# Patient Record
Sex: Male | Born: 1935
Health system: Southern US, Community
[De-identification: ages and names within clinical notes are randomized; demographics above are authoritative.]

## PROBLEM LIST (undated history)

## (undated) DIAGNOSIS — C61 Malignant neoplasm of prostate: Secondary | ICD-10-CM

## (undated) DIAGNOSIS — R911 Solitary pulmonary nodule: Secondary | ICD-10-CM

## (undated) DIAGNOSIS — E781 Pure hyperglyceridemia: Secondary | ICD-10-CM

## (undated) DIAGNOSIS — Z5189 Encounter for other specified aftercare: Secondary | ICD-10-CM

## (undated) DIAGNOSIS — R7303 Prediabetes: Secondary | ICD-10-CM

## (undated) DIAGNOSIS — C160 Malignant neoplasm of cardia: Secondary | ICD-10-CM

## (undated) DIAGNOSIS — E039 Hypothyroidism, unspecified: Secondary | ICD-10-CM

## (undated) DIAGNOSIS — N184 Chronic kidney disease, stage 4 (severe): Secondary | ICD-10-CM

## (undated) DIAGNOSIS — I82409 Acute embolism and thrombosis of unspecified deep veins of unspecified lower extremity: Secondary | ICD-10-CM

## (undated) DIAGNOSIS — I10 Essential (primary) hypertension: Secondary | ICD-10-CM

## (undated) DIAGNOSIS — IMO0001 Reserved for inherently not codable concepts without codable children: Secondary | ICD-10-CM

## (undated) HISTORY — DX: Solitary pulmonary nodule: R91.1

## (undated) HISTORY — DX: Reserved for inherently not codable concepts without codable children: IMO0001

## (undated) HISTORY — DX: Pure hyperglyceridemia: E78.1

## (undated) HISTORY — PX: OTHER SURGICAL HISTORY: SHX169

## (undated) HISTORY — DX: Acute embolism and thrombosis of unspecified deep veins of unspecified lower extremity: I82.409

## (undated) HISTORY — DX: Malignant neoplasm of prostate: C61

## (undated) HISTORY — PX: HERNIA REPAIR: SHX51

## (undated) HISTORY — DX: Prediabetes: R73.03

## (undated) HISTORY — DX: Encounter for other specified aftercare: Z51.89

## (undated) HISTORY — PX: PROSTATECTOMY: SHX69

## (undated) HISTORY — DX: Hypothyroidism, unspecified: E03.9

## (undated) HISTORY — DX: Essential (primary) hypertension: I10

---

## 1978-12-15 HISTORY — PX: OTHER SURGICAL HISTORY: SHX169

## 1996-12-15 HISTORY — PX: PROSTATECTOMY: SHX69

## 2008-06-29 ENCOUNTER — Ambulatory Visit (HOSPITAL_BASED_OUTPATIENT_CLINIC_OR_DEPARTMENT_OTHER): Admission: RE | Admit: 2008-06-29 | Discharge: 2008-06-29 | Payer: Self-pay | Admitting: General Surgery

## 2008-06-29 HISTORY — PX: OTHER SURGICAL HISTORY: SHX169

## 2008-12-15 HISTORY — PX: HERNIA REPAIR: SHX51

## 2011-04-29 NOTE — H&P (Signed)
NAME:  Daniel [name], Daniel [name]              ACCOUNT NO.:  [id number]   MEDICAL RECORD NO.:  [id number]          PATIENT TYPE:  AMB   LOCATION:  [facility]                         FACILITY:  [facility]   PHYSICIAN:  Orson Ape. Weatherly, M.D.DATE OF BIRTH:  1927-06-02   DATE OF ADMISSION:  06/29/2008  DATE OF DISCHARGE:                              HISTORY & PHYSICAL   CHIEF COMPLAINT:  Left inguinal hernia.   HISTORY:  Mr. Daniel [name] is a 77 year old Caucasian male, a retired  Gaffer, who was referred to me by Dr. Orpah Melter for a  mildly symptomatic left inguinal hernia.  The patient has had an open  prostatectomy for prostate cancer in the 1990's.  His PSA's have been  negative.  Over the last several years, he has noticed a little bulge  that has gradually increased in the left groin area.  More recently, he  is actually getting a little bulge that advances to the top of the  scrotum.   MEDICATIONS:  1. Atenolol 50 mg daily for blood pressure.  2. He takes a baby aspirin, which is not taking.  3. Synthroid 125 mcg daily for thyroid replacement.   ALLERGIES:  SEASONAL ALLERGIES ONLY.   PAST MEDICAL/SURGICAL HISTORY:  1. The prostate cancer, done through an open midline.  2. Nasal surgery.  3. Right ankle fracture.  4. Torn meniscus in the right knee.   SOCIAL HISTORY:  The patient is widowed.  His wife died in 2023-04-24.  He  lives alone.  He is quite active, gardening, etc.  His son will be with  him this evening, since he lives alone.   PHYSICAL EXAMINATION:  GENERAL:  He is an active male, who appears his  stated age, possibly a little younger.  He is in no acute distress.  VITAL SIGNS:  Today, and he did take his blood pressure this morning.  He weighs 180 pounds, height 5 feet 10 inches.  HEENT:  Unremarkable.  LUNGS:  Clear.  HEART:  A normal sinus rhythm.  ABDOMEN:  Flat, soft.  A well-healed lower midline incision.  With  straining there is a definite direct bulge.   Also a little extension  down into the top of the left scrotum.  I do not appreciate a hernia on  the right.  RECTAL:  In the office was unremarkable.  CNS:  Physiologic.   ADMISSION IMPRESSION:  1. Symptomatic left inguinal hernia, probably kind of a direct and an      indirect combination.  2. History of prostate cancer with normal prostatic specific antigens      now.  3. Mild hypertension.           ______________________________  Orson Ape. Rise Patience, M.D.     WJW/MEDQ  D:  06/29/2008  T:  06/29/2008  Job:  [id number]

## 2011-04-29 NOTE — Op Note (Signed)
NAME:  [name], Daniel              ACCOUNT NO.:  [id number]   MEDICAL RECORD NO.:  [id number]          PATIENT TYPE:  AMB   LOCATION:  [facility]                         FACILITY:  [facility]   PHYSICIAN:  Orson Ape. Weatherly, M.D.DATE OF BIRTH:  July 30, 1927   DATE OF PROCEDURE:  06/29/2008  DATE OF DISCHARGE:                               OPERATIVE REPORT   PREOPERATIVE DIAGNOSIS:  Left inguinal hernia.   OPERATION:  Left inguinal herniorrhaphy, a Lichtenstein type repair,  mesh sail.   ANESTHESIA:  Local with sedation.   SURGEON:  Dr. Orson Ape. Weatherly.   ASSISTANT:  Nurse.   INDICATIONS FOR PROCEDURE:  Daniel [name] is a 77 year old Caucasian  male who was referred to me by Dr. Orpah Melter, for a symptomatic  left inguinal hernia.  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  He lives alone and states that he is not really having any  problems with his prostate and his PSA's have been negative.  He is on  Synthroid 125 mcg daily.  He has had basically no other abdominal  surgeries.  He does take a mild diuretic for blood pressure.  His wife  died in 05-10-2023.   PHYSICAL EXAMINATION:  ABDOMEN:  He has an obvious bulge in the left  groin.  When he stands and strains vigorously, he will get the bulge  extending on down into the scrotum.  I expect he has a direct and  possibly an indirect kind of combination.   DESCRIPTION OF PROCEDURE:  Preoperatively he was given a gram of Ancef.  His electrolytes and all were normal.  He was taken back to the  operative suite.  The patient had already shaved himself part of the  way, but I used clippers to remove the remaining areas.  Then prepped  him with Betadine solution.  We started him on some IV sedation, but he  never required anything as far as assistance on his ventilation.  The  inguinal incision area after the Betadine prep was infiltrated  with a  mixture of 0.5% plain Xylocaine and 0.5% Marcaine with adrenalin.  Then  the ilioinguinal nerve area was blocked with about 10 mL of the same  solution.  The skin incision was picked up, a small incision made.  The  underlying three veins were identified, clamped, divided and ligated  with fine Vicryl.  Then the external oblique aponeurosis.  I infiltrated  it with a little additional Xylocaine mixture.  This was opened.  The  ilioinguinal nerve was protected, retracted, and the cord structures  elevated with the Penrose drain.  You could see that this is a direct  and indirect combination and I opened the areolar tissue with the cord  structures, dissected out the hernia sac.  It was about two inches in  length.  A high sac ligation was performed with #2-0 Vicryl under direct  vision.  This retracted back in the pre-peritoneal space and then the  floor was repaired with a running #2-0 Prolene,  starting at the  symphysis pubis, suturing the shelving edge of the inguinal ligament to  the conjoined tendon area, recreating the internal ring.  Then we went  back and tied the two ends together.  We did infiltrate the floor with a  little of the Marcaine mixture and then a piece of Prolene mesh was  placed, shaped like a sail and slit laterally and placed under the  ilioinguinal nerve.  This was sutured to the shelving edge of the  inguinal ligament with a running #2-0 Prolene.  The two tails sutured  around the internal ring and then the superior flap was sutured down  with interrupted sutures of #2-0 Prolene and some #2-0 Vicryl.  The mesh  was lying flat and not under excessive tension, and I think the nerve  was not injured.  Then the external oblique was closed with a running #3-  0 Vicryl.  Some #3-0 Vicryl interrupted in the Scarpa's fascia and then  a #4-0 Vicryl subcuticular and Benzoin and Steri-Strips on the skin.   The patient tolerated the procedure nicely.  His testicle  was in its  normal position.  He will be released after a short stay in the recovery  room.  His son will be with him this evening, since he lives alone, but  he should be okay for independent status tomorrow.  He understands he is  not to drive, as long as he is taking Vicodin.  I will see him in the  office in approximately two weeks.           ______________________________  Orson Ape. Rise Patience, M.D.     WJW/MEDQ  D:  06/29/2008  T:  06/29/2008  Job:  [id number]   cc:   Orpah Melter, M.D.  Fax: [phone]

## 2011-08-28 ENCOUNTER — Encounter (INDEPENDENT_AMBULATORY_CARE_PROVIDER_SITE_OTHER): Payer: Self-pay | Admitting: General Surgery

## 2011-08-29 ENCOUNTER — Ambulatory Visit (INDEPENDENT_AMBULATORY_CARE_PROVIDER_SITE_OTHER): Payer: Medicare Other | Admitting: General Surgery

## 2011-08-29 ENCOUNTER — Encounter (INDEPENDENT_AMBULATORY_CARE_PROVIDER_SITE_OTHER): Payer: Self-pay | Admitting: General Surgery

## 2011-08-29 VITALS — BP 104/76 | HR 60 | Temp 98.2°F | Ht 67.0 in | Wt 162.4 lb

## 2011-08-29 DIAGNOSIS — K4090 Unilateral inguinal hernia, without obstruction or gangrene, not specified as recurrent: Secondary | ICD-10-CM

## 2011-08-29 NOTE — Patient Instructions (Signed)
Check and make sure his son can be reviewed at the time of surgery as you were not to drive that day but hopefully can drive in one or 2 days

## 2011-08-29 NOTE — Progress Notes (Signed)
Subjective:     Patient ID: Daniel [name], male   DOB: [birth year], 77 y.o.   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 he did well and on reason examination he's got a right inguinal hernia that is now minimally symptomatic his daughter-in-law works as a Systems developer [name] at [employer] and she's got him a belt that he is presently wearing but he like to have his hernia repaired. He does not have any symptoms of bowel obstruction difficulty voiding and he's had a previous prostatectomy through a midline incision approximately 20 years ago and has had no follow problems with his prostate cancer. The patient is retired he has a girlfriend they dance and of activities but he lives alone. He used to work as Clinical biochemist for the [employer] prior to his retirement but he's been retired for quite a number of years.   Review of Systems Past Medical History  Diagnosis Date  . Hypothyroid   . Hypertriglyceridemia   . Prostate cancer   . Gout   . Hypertension   . Tinnitus   . Pulmonary nodule   . Prediabetes   . Blood transfusion   . Hearing loss    Current Outpatient Prescriptions  Medication Sig Dispense Refill  . ALPRAZolam (XANAX) 0.5 MG tablet Take 0.5 mg by mouth 3 (three) times daily as needed.        Marland Kitchen aspirin 81 MG tablet Take 81 mg by mouth daily.        Marland Kitchen atenolol (TENORMIN) 50 MG tablet Take 50 mg by mouth daily.        Marland Kitchen levothyroxine (SYNTHROID, LEVOTHROID) 150 MCG tablet Take 150 mcg by mouth daily.         No Known Allergies Past Surgical History  Procedure Date  . Prostatectomy     prostate cancer  . Left inguinal hernia repair 06/29/2008    with mesh sail  . Right ankle     fracture  . Right knee     torn meniscus  . Hernia repair   Limited review of systems he denies any significant allergies he is not restricted as far as activity and denies any angina  or pulmonary symptoms. He's not had an EKG a chest x-ray since his previous hernia repair 3 years ago.      Objective:   Physical ExamBP 104/76  Pulse 60  Temp 98.2 F (36.8 C)  Ht 5\' 7"  (1.702 m)  Wt 162 lb 6 oz (73.653 kg)  BMI 25.43 kg/m2 Patient is a well-developed small Caucasian male in no acute distress. He wears a hair piece but eyes ears and throat unremarkable. There is no supraclavicular or cervical lymphadenopathy the lungs are clear cardiac normal sinus rhythm. S1 and abdomen and is presently wearing a hernia belt. The right angle hernia area if he stands precludes probably a direct inguinal hernia with no evidence of any weakness or problems on the left. I did not do a rectal examination alert but is having no problems with voiding. No pedal edema good peripheral pulses    Assessment:    Right inguinal hernia minimally symptomatic and he desires surgical correction no evidence of weakness on the left side from a previous left ankle hernia repair and a well-healed healed midline lower dome incision from an open prostatectomy 20+ years ago     Plan:    Plan  surgical repair at [facility] in the near future. His son and will be with him postoperative first night

## 2011-09-12 LAB — BASIC METABOLIC PANEL
BUN: 14
Calcium: 9.2
GFR calc non Af Amer: >60
Glucose, Bld: 114 — ABNORMAL HIGH
Potassium: 4.1
Sodium: 142

## 2011-09-12 LAB — DIFFERENTIAL
Basophils Absolute: 0.1
Eosinophils Relative: 5
Lymphocytes Relative: 22
Lymphs Abs: 2
Neutro Abs: 5.9

## 2011-09-12 LAB — CBC
HCT: 48.2
Platelets: 248
RDW: 13.2
WBC: 9

## 2011-09-26 ENCOUNTER — Ambulatory Visit (HOSPITAL_COMMUNITY)
Admission: RE | Admit: 2011-09-26 | Discharge: 2011-09-26 | Disposition: A | Discharge status: Home / Self Care | Payer: Medicare Other | Source: Ambulatory Visit | Attending: General Surgery | Admitting: General Surgery

## 2011-09-26 ENCOUNTER — Other Ambulatory Visit (INDEPENDENT_AMBULATORY_CARE_PROVIDER_SITE_OTHER): Payer: Self-pay | Admitting: General Surgery

## 2011-09-26 ENCOUNTER — Ambulatory Visit (HOSPITAL_BASED_OUTPATIENT_CLINIC_OR_DEPARTMENT_OTHER)
Admission: RE | Admit: 2011-09-26 | Discharge: 2011-09-26 | Disposition: A | Discharge status: Home / Self Care | Payer: Medicare Other | Source: Ambulatory Visit | Attending: General Surgery | Admitting: General Surgery

## 2011-09-26 DIAGNOSIS — Z01818 Encounter for other preprocedural examination: Secondary | ICD-10-CM | POA: Insufficient documentation

## 2011-09-26 DIAGNOSIS — K4091 Unilateral inguinal hernia, without obstruction or gangrene, recurrent: Secondary | ICD-10-CM | POA: Insufficient documentation

## 2011-09-26 DIAGNOSIS — E039 Hypothyroidism, unspecified: Secondary | ICD-10-CM | POA: Insufficient documentation

## 2011-09-26 DIAGNOSIS — D1779 Benign lipomatous neoplasm of other sites: Secondary | ICD-10-CM | POA: Insufficient documentation

## 2011-09-26 DIAGNOSIS — K4090 Unilateral inguinal hernia, without obstruction or gangrene, not specified as recurrent: Secondary | ICD-10-CM | POA: Insufficient documentation

## 2011-09-26 DIAGNOSIS — Z01810 Encounter for preprocedural cardiovascular examination: Secondary | ICD-10-CM | POA: Insufficient documentation

## 2011-09-26 DIAGNOSIS — I10 Essential (primary) hypertension: Secondary | ICD-10-CM | POA: Insufficient documentation

## 2011-09-26 DIAGNOSIS — C61 Malignant neoplasm of prostate: Secondary | ICD-10-CM | POA: Insufficient documentation

## 2011-09-26 DIAGNOSIS — Z7982 Long term (current) use of aspirin: Secondary | ICD-10-CM | POA: Insufficient documentation

## 2011-09-26 DIAGNOSIS — Z79899 Other long term (current) drug therapy: Secondary | ICD-10-CM | POA: Insufficient documentation

## 2011-09-26 DIAGNOSIS — Z01811 Encounter for preprocedural respiratory examination: Secondary | ICD-10-CM | POA: Insufficient documentation

## 2011-09-26 DIAGNOSIS — Z01812 Encounter for preprocedural laboratory examination: Secondary | ICD-10-CM | POA: Insufficient documentation

## 2011-09-26 LAB — BASIC METABOLIC PANEL
Chloride: 105 mEq/L (ref 96–112)
GFR calc Af Amer: 86 mL/min — ABNORMAL LOW (ref >90)
GFR calc non Af Amer: 74 mL/min — ABNORMAL LOW (ref >90)
Glucose, Bld: 99 mg/dL (ref 70–99)
Potassium: 4.1 mEq/L (ref 3.5–5.1)
Sodium: 139 mEq/L (ref 135–145)

## 2011-09-26 LAB — CBC
Hemoglobin: 14.6 g/dL (ref 13.0–17.0)
MCHC: 35.2 g/dL (ref 30.0–36.0)
WBC: 8.1 10*3/uL (ref 4.0–10.5)

## 2011-09-26 LAB — POCT I-STAT 4, (NA,K, GLUC, HGB,HCT)
Glucose, Bld: 93 mg/dL (ref 70–99)
Potassium: 3.8 mEq/L (ref 3.5–5.1)

## 2011-09-26 LAB — DIFFERENTIAL
Basophils Absolute: 0.1 10*3/uL (ref 0.0–0.1)
Basophils Relative: 1 % (ref 0–1)
Monocytes Absolute: 0.7 10*3/uL (ref 0.1–1.0)
Neutro Abs: 4.6 10*3/uL (ref 1.7–7.7)
Neutrophils Relative %: 56 % (ref 43–77)

## 2011-10-01 NOTE — Op Note (Signed)
NAME:  Daniel [name], Daniel [name]              ACCOUNT NO.:  [id number]  MEDICAL RECORD NO.:  [id number]  LOCATION:  XRAY                         FACILITY:  [facility]  PHYSICIAN:  Orson Ape. Kaleth Koy, M.D.DATE OF BIRTH:  1927/07/18  DATE OF PROCEDURE:  09/26/2011 DATE OF DISCHARGE:                              OPERATIVE REPORT   PREOPERATIVE DIAGNOSIS:  Right inguinal hernia, probably direct.  POSTOPERATIVE DIAGNOSIS:  Right inguinal hernia.  There was a direct and a small indirect component.  OPERATION:  Right inguinal herniorrhaphy with mesh.  ANESTHESIA:  General anesthesia with local supplementation.  HISTORY:  Daniel [name] is an 76 year old male who approximately 3 years ago repaired a left inguinal hernia.  He has had a previous open prostatectomy years ago and has had no further problems with his prostate cancer.  Recently, he has noticed a bulge on the right groin area, kind of symmetrical to his previous left inguinal hernia.  I saw him in the office and he desired to having this repaired on a Friday if possible since his children are off work and can be with him that day as he lives alone.  He is on atenolol for blood pressure management and otherwise is in reasonably good health with his age of 67 years.  His electrolytes etc preoperatively were normal.  Potassium 3.5, hematocrit 34.  The patient had induction of general anesthesia with a LMA tube, then the right groin area was clipped and then prepped with Betadine solution.  Time-out was completed.  The area had been marked previously and we then draped him in a sterile manner.  The ilioinguinal block was first placed with a blunted 22-gauge needle at the anterior iliac crest, about 10 mL of 0.5% Marcaine with adrenaline.  Next, the inguinal incision was made, sharp dissection through the skin, subcutaneous, and Scarpa fascia and then the external oblique aponeurosis was identified. Two superficial veins had been clamped,  divided, and ligated with 1Vicryl and then the external oblique was opened through the external ring.  I could see one of the what looks like a very small ilioinguinal nerve and I tried to protract that, elevating it with the cord structures.  Next, he has sort of a lipoma coming down with the cord, it was seated, direct hernia component and I separated this lipoma from the cord structures.  He had about a 1 inch and a half indirect hernia sac that was removed.  High sac ligation was done under direct vision with 2- 0 Surgilon and a second suture was placed just past the first and the hernia sac removed.  Next, invaginated the floor and enclosed the shelving edge of the inguinal ligament to the conjoined tendon area with a running 0 Prolene, reinforcing or closing the floor and then running it back tying the two ends together.  Next, a piece of Prolene mesh shaped like a sail was sutured inferiorly and sutured to the inguinal ligament.  The two tails were around the internal new created inguinal ring and the two tails sutured together laterally and the mesh was lying flat.  I then used interrupted 2-0 Prolene sutures to suture the mesh to the medial aspect and on  the internal oblique fascia laterally, and the mesh was lying without excessive tension.  I had the anesthetist kind of lighten him at this time and when he squeezes up, the mesh looked like it would be better reinforced if we had it little tighter, so I took some of the sutures out laterally and pulled it up about 1 cm and then re-sutured it with the 2-0 Prolene.  Next, the external oblique aponeurosis was closed with a running 2-0 Vicryl, and the new external ring, I left it fairly wide since I think that it will not cause him to have any swelling in his testicle.  I made sure that the little mesh where it goes around the new internal ring is not excessively tight when the patient is relaxed.  The Scarpa fascia was then closed  with interrupted 3-0 Vicryl, 4-0 Vicryl subcuticular suture, benzoin and Steri-Strips on the skin.  The testicle was in its normal position.  I had put some additional Marcaine in the floor where I repaired it, in all total of about 25 mL of the Marcaine was used.  The patient hopefully will have no problems voiding, since he has had a previous prostatectomy and his son will be with him this evening.  He will be on a light diet today.  Hopefully, he will not have problems voiding and then follow up in the office in approximately 2 weeks.  The patient had received 1 gm of Ancef preoperatively.     Orson Ape. Rise Patience, M.D.     WJW/MEDQ  D:  09/26/2011  T:  09/26/2011  Job:  [id number]  Electronically Signed by Jeanella Anton M.D. on 10/01/2011 08:54:24 AM

## 2011-10-02 ENCOUNTER — Encounter (INDEPENDENT_AMBULATORY_CARE_PROVIDER_SITE_OTHER): Payer: Self-pay | Admitting: General Surgery

## 2011-10-03 ENCOUNTER — Encounter (INDEPENDENT_AMBULATORY_CARE_PROVIDER_SITE_OTHER): Payer: Self-pay | Admitting: General Surgery

## 2011-10-03 ENCOUNTER — Ambulatory Visit (INDEPENDENT_AMBULATORY_CARE_PROVIDER_SITE_OTHER): Payer: Medicare Other | Admitting: General Surgery

## 2011-10-03 DIAGNOSIS — K4090 Unilateral inguinal hernia, without obstruction or gangrene, not specified as recurrent: Secondary | ICD-10-CM

## 2011-10-03 NOTE — Patient Instructions (Signed)
Limits her activities no lifting of approximately 35 pounds for the next month. I think an adult aspirin probably twice a day for the next couple of weeks would decrease your incisional pain. See me for final followup visit in one month

## 2011-10-03 NOTE — Progress Notes (Signed)
Subjective:     Patient ID: Daniel [name], male   DOB: 03/31/1927, 77 y.o.   MRN: [id number] Patient returns pressure one week from a riding hernia repair and he says he is having very little pain at this time. He had a little spell initially in the scrotum and a little blurriness at the base of his penis but that's all tightened away and he's back to normal activityHPI   Review of Systemsno change      Objective:   Physical Exam BP 118/60  Pulse 60  Temp 97.6 F (36.4 C)  Resp 18  Ht 5\' 7"  (1.702 m)  Wt 160 lb 8 oz (72.802 kg)  BMI 25.14 kg/m2   The patient's incision is healing nicely there is no swelling in the right testicle slight bruise in the most dependent portion of the scrotum and no evidence of any acute inflammation or problems in his right inguinal incision areas his pain is decreasing rapidly and I would recommend he take an adult aspirin about twice a day for the next week or 2 minimize his pain Assessment:    Satisfactory postoperative course right angle hernia repair planned final followup in one month     Plan:

## 2011-11-04 ENCOUNTER — Encounter (INDEPENDENT_AMBULATORY_CARE_PROVIDER_SITE_OTHER): Payer: Medicare Other | Admitting: General Surgery

## 2018-08-12 ENCOUNTER — Other Ambulatory Visit: Payer: Self-pay | Admitting: Nephrology

## 2018-08-12 DIAGNOSIS — N183 Chronic kidney disease, stage 3 unspecified: Secondary | ICD-10-CM

## 2018-08-12 DIAGNOSIS — I129 Hypertensive chronic kidney disease with stage 1 through stage 4 chronic kidney disease, or unspecified chronic kidney disease: Secondary | ICD-10-CM

## 2018-08-12 DIAGNOSIS — R809 Proteinuria, unspecified: Secondary | ICD-10-CM

## 2018-08-24 ENCOUNTER — Ambulatory Visit
Admission: RE | Admit: 2018-08-24 | Discharge: 2018-08-24 | Disposition: A | Discharge status: Home / Self Care | Payer: Medicare Other | Source: Ambulatory Visit | Attending: Nephrology | Admitting: Nephrology

## 2018-08-24 DIAGNOSIS — R809 Proteinuria, unspecified: Secondary | ICD-10-CM

## 2018-08-24 DIAGNOSIS — N183 Chronic kidney disease, stage 3 unspecified: Secondary | ICD-10-CM

## 2018-08-24 DIAGNOSIS — I129 Hypertensive chronic kidney disease with stage 1 through stage 4 chronic kidney disease, or unspecified chronic kidney disease: Secondary | ICD-10-CM

## 2018-08-25 ENCOUNTER — Telehealth: Payer: Self-pay | Admitting: Hematology

## 2018-08-25 NOTE — Telephone Encounter (Signed)
New referral received from Dr. Carolin Sicks from [facility] for elevated serum free light chains. Pt has been scheduled to see Dr. Maylon Peppers on 9/23 at 1pm. Pt's daughter is aware of the appt date and time.

## 2018-08-28 ENCOUNTER — Other Ambulatory Visit: Payer: Self-pay | Admitting: Hematology and Oncology

## 2018-08-28 ENCOUNTER — Other Ambulatory Visit: Payer: Self-pay | Admitting: Hematology

## 2018-09-02 ENCOUNTER — Telehealth: Payer: Self-pay | Admitting: *Deleted

## 2018-09-02 ENCOUNTER — Other Ambulatory Visit: Payer: Self-pay | Admitting: Hematology

## 2018-09-02 DIAGNOSIS — J449 Chronic obstructive pulmonary disease, unspecified: Secondary | ICD-10-CM | POA: Insufficient documentation

## 2018-09-02 DIAGNOSIS — I10 Essential (primary) hypertension: Secondary | ICD-10-CM | POA: Insufficient documentation

## 2018-09-02 DIAGNOSIS — D8989 Other specified disorders involving the immune mechanism, not elsewhere classified: Secondary | ICD-10-CM

## 2018-09-02 DIAGNOSIS — E039 Hypothyroidism, unspecified: Secondary | ICD-10-CM | POA: Insufficient documentation

## 2018-09-02 DIAGNOSIS — C9000 Multiple myeloma not having achieved remission: Secondary | ICD-10-CM | POA: Insufficient documentation

## 2018-09-02 NOTE — Telephone Encounter (Signed)
Attempted to call patient to advise we needed to add lab appointment before his new patient appointment and also Dr Maylon Peppers wanted him to have a DG Bone Survey done as well before his appointment if at all possible.  The Xray of his bones has been scheduled for tomorrow.  Phone number on file was for daughter.  Left detailed message and return call back number in case new appt times and dates do not work.

## 2018-09-02 NOTE — Assessment & Plan Note (Addendum)
The patient's external labs and documents were reviewed extensively in the collaborated with the patient and his family. 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  While recent labile BP due to medication adjustment may be a contributor to the kidney disease, light chain myeloma is known to cause kidney dysfunction.  Patient underwent bone marrow biopsy in end of 08/2018, which showed 12% plasma cells on morphology.  The case was discussed w/ pathology to clarify the flow cytometry report, which did not test for monoclonal plasma cell population; however, the IHC showed lambda light chain restriction, consistent w/ plasma cell neoplasm. Metastatic skeletal survey did not show any bony lesion. Given that patient is not a transplant candidate and has other myeloma-defined features, PET scan would not change treatment; therefore, we will not pursue additional imaging studies.  Based on the patient's age, oral regimen, such as IRd, may be more preferable over IV/SubQ regimen.

## 2018-09-03 ENCOUNTER — Ambulatory Visit (HOSPITAL_COMMUNITY): Admission: RE | Admit: 2018-09-03 | Payer: Medicare Other | Source: Ambulatory Visit

## 2018-09-06 ENCOUNTER — Inpatient Hospital Stay: Payer: Medicare Other | Attending: Hematology | Admitting: Hematology

## 2018-09-06 ENCOUNTER — Other Ambulatory Visit: Payer: Self-pay | Admitting: *Deleted

## 2018-09-06 ENCOUNTER — Telehealth: Payer: Self-pay | Admitting: Hematology

## 2018-09-06 ENCOUNTER — Inpatient Hospital Stay: Payer: Medicare Other

## 2018-09-06 ENCOUNTER — Encounter: Payer: Self-pay | Admitting: Hematology

## 2018-09-06 VITALS — BP 160/72 | HR 52 | Temp 97.6°F | Resp 18 | Ht 67.0 in | Wt 153.8 lb

## 2018-09-06 DIAGNOSIS — J449 Chronic obstructive pulmonary disease, unspecified: Secondary | ICD-10-CM

## 2018-09-06 DIAGNOSIS — D472 Monoclonal gammopathy: Secondary | ICD-10-CM

## 2018-09-06 DIAGNOSIS — N183 Chronic kidney disease, stage 3 unspecified: Secondary | ICD-10-CM | POA: Insufficient documentation

## 2018-09-06 DIAGNOSIS — C9000 Multiple myeloma not having achieved remission: Secondary | ICD-10-CM | POA: Insufficient documentation

## 2018-09-06 DIAGNOSIS — D8989 Other specified disorders involving the immune mechanism, not elsewhere classified: Secondary | ICD-10-CM

## 2018-09-06 DIAGNOSIS — D539 Nutritional anemia, unspecified: Secondary | ICD-10-CM | POA: Insufficient documentation

## 2018-09-06 DIAGNOSIS — Z87891 Personal history of nicotine dependence: Secondary | ICD-10-CM | POA: Insufficient documentation

## 2018-09-06 DIAGNOSIS — D519 Vitamin B12 deficiency anemia, unspecified: Secondary | ICD-10-CM | POA: Insufficient documentation

## 2018-09-06 DIAGNOSIS — I10 Essential (primary) hypertension: Secondary | ICD-10-CM

## 2018-09-06 DIAGNOSIS — I129 Hypertensive chronic kidney disease with stage 1 through stage 4 chronic kidney disease, or unspecified chronic kidney disease: Secondary | ICD-10-CM | POA: Diagnosis not present

## 2018-09-06 DIAGNOSIS — E039 Hypothyroidism, unspecified: Secondary | ICD-10-CM

## 2018-09-06 DIAGNOSIS — R768 Other specified abnormal immunological findings in serum: Secondary | ICD-10-CM | POA: Insufficient documentation

## 2018-09-06 LAB — CMP (CANCER CENTER ONLY)
ALK PHOS: 70 U/L (ref 38–126)
ALT: 7 U/L (ref 0–44)
AST: 18 U/L (ref 15–41)
Albumin: 3.6 g/dL (ref 3.5–5.0)
Anion gap: 8 (ref 5–15)
BUN: 31 mg/dL — ABNORMAL HIGH (ref 8–23)
CO2: 26 mmol/L (ref 22–32)
Calcium: 9.1 mg/dL (ref 8.9–10.3)
Chloride: 108 mmol/L (ref 98–111)
Creatinine: 2.08 mg/dL — ABNORMAL HIGH (ref 0.61–1.24)
GFR, Est AFR Am: 31 mL/min — ABNORMAL LOW (ref >60)
GFR, Estimated: 27 mL/min — ABNORMAL LOW (ref >60)
Glucose, Bld: 81 mg/dL (ref 70–99)
Potassium: 4.8 mmol/L (ref 3.5–5.1)
Sodium: 142 mmol/L (ref 135–145)
TOTAL PROTEIN: 6.2 g/dL — AB (ref 6.5–8.1)
Total Bilirubin: 0.4 mg/dL (ref 0.3–1.2)

## 2018-09-06 LAB — CBC WITH DIFFERENTIAL (CANCER CENTER ONLY)
Basophils Absolute: 0 10*3/uL (ref 0.0–0.1)
Basophils Relative: 0 %
EOS PCT: 9 %
Eosinophils Absolute: 0.9 10*3/uL — ABNORMAL HIGH (ref 0.0–0.5)
HCT: 33.1 % — ABNORMAL LOW (ref 38.4–49.9)
HEMOGLOBIN: 11.4 g/dL — AB (ref 13.0–17.1)
LYMPHS ABS: 3.2 10*3/uL (ref 0.9–3.3)
Lymphocytes Relative: 33 %
MCH: 38.8 pg — ABNORMAL HIGH (ref 27.2–33.4)
MCHC: 34.4 g/dL (ref 32.0–36.0)
MCV: 112.6 fL — ABNORMAL HIGH (ref 79.3–98.0)
MONO ABS: 0.6 10*3/uL (ref 0.1–0.9)
MONOS PCT: 7 %
Neutro Abs: 5 10*3/uL (ref 1.5–6.5)
Neutrophils Relative %: 51 %
PLATELETS: 193 10*3/uL (ref 140–400)
RBC: 2.94 MIL/uL — ABNORMAL LOW (ref 4.20–5.82)
RDW: 15.3 % — ABNORMAL HIGH (ref 11.0–14.6)
WBC Count: 9.7 10*3/uL (ref 4.0–10.3)

## 2018-09-06 LAB — VITAMIN B12

## 2018-09-06 LAB — IRON AND TIBC
Iron: 57 ug/dL (ref 42–163)
Saturation Ratios: 27 % — ABNORMAL LOW (ref 42–163)
TIBC: 215 ug/dL (ref 202–409)
UIBC: 158 ug/dL

## 2018-09-06 LAB — FOLATE: Folate: 19.8 ng/mL (ref >5.9)

## 2018-09-06 NOTE — Telephone Encounter (Signed)
Appts scheduled avs/calendar printed per 9/23 los °

## 2018-09-06 NOTE — Progress Notes (Addendum)
[place] NOTE  Patient Care Team: Orpah Melter, MD as PCP - General [facility])  ASSESSMENT & PLAN:  Light chain disease, lambda type [facility]) The patient's external labs and documents were reviewed extensively in the collaborated with the patient and his family. 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  While recent labile BP due to medication adjustment may be a contributor to the kidney disease, light chain myeloma is known to cause kidney dysfunction.  Patient underwent bone marrow biopsy in end of 08/2018, which showed 12% plasma cells on morphology.  The case was discussed w/ pathology to clarify the flow cytometry report, which did not test for monoclonal plasma cell population; however, the IHC showed lambda light chain restriction, consistent w/ plasma cell neoplasm. Metastatic skeletal survey did not show any bony lesion. Given that patient is not a transplant candidate and has other myeloma-defined features, PET scan would not change treatment; therefore, we will not pursue additional imaging studies.  Based on the patient's age, oral regimen, such as IRd, may be more preferable over IV/SubQ regimen.   Macrocytic anemia Hemoglobin 11.4 with MCV of 112 today, asymptomatic. Patient reports of family history of hemochromatosis; however, his iron profile is relatively normal with borderline low iron saturation, arguing against hemochromatosis. Folate normal, but B12 noted to be severely depleted (<50); the patient is asymptomatic except occasional neuropathy and does not have any other neurologic deficits.  We will check intrinsic factor and parietal cell antibodies at the next visit.  He will need B12 IM injection weekly x 4, and then monthly thereafter. We will need to monitor his B12 level closely to ensure that it improves with  injection. No indication for transfusion or ESA. He will require bone marrow biopsy as discussed above to assess for any underlying multiple myeloma.  HTN (hypertension) BP slightly high today (160/72).  The patient reports that his BP has been relatively well controlled at home since recent medication adjustment by his PCP. I encouraged the patient to follow-up with his primary care physician closely, given his episode of labile blood pressure in February 2019.  CKD (chronic kidney disease) stage 3, GFR 30-59 ml/min (HCC) Creatinine 2.1 today, stable compared to recent lab checks at his nephrologist office. Recent renal ultrasound in September 2019 did not demonstrate any abnormality. Myeloma labs pending today.  Bone marrow biopsy to be scheduled as discussed above. I encouraged patient to maintain adequate p.o. hydration and to avoid nephrotoxic medications, including NSAIDs. Continue follow-up with the patient nephrologist as scheduled.  B12 deficiency anemia B12 level noted to be severely low (less than 50) in September 2019.  Patient does not have any worrisome neurologic symptom except occasional mild tingling in the hands. As documented above, patient will need IM weekly B12 injection x4 weeks, and then monthly thereafter. 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 We will check intrinsic factor and parietal cell antibodies at the next visit. Patient will need close monitoring of B12 levels to ensure adequate repletion.   Orders Placed This Encounter  Procedures  . Iron and TIBC    Standing Status:   Future    Number of Occurrences:   1    Standing Expiration Date:   10/11/2019  . Folate    Standing Status:   Future    Number of Occurrences:   1    Standing Expiration  Date:   10/11/2019  . Vitamin B12    Standing Status:   Future    Number of Occurrences:   1    Standing Expiration Date:   10/11/2019  . Copper, serum    Standing  Status:   Future    Standing Expiration Date:   10/11/2019  . Zinc    Standing Status:   Future    Standing Expiration Date:   10/11/2019  . Intrinsic factor antibodies    Standing Status:   Future    Standing Expiration Date:   09/07/2019    A total of more than 60 minutes were spent face-to-face with the patient during this encounter and over half of that time was spent on counseling and coordination of care as outlined above.    All questions were answered. The patient knows to call the clinic with any problems, questions or concerns.  RTC in 2 weeks to follow up bone marrow biopsy results.   Tish Men, MD 09/16/2018 4:21 PM   CHIEF COMPLAINTS/PURPOSE OF CONSULTATION:  "I am here for my kidney problem"  HISTORY OF PRESENTING ILLNESS:  Daniel [name] 84 y.o. male is here because of recently discovered elevated serum free lambda light chain.   Patient reports that in February 2019, his primary care physician adjusted his blood pressure medication, which caused him to be very dizzy and weak.  During that timeframe, it was noted that his serum creatinine was significantly elevated, and after his blood pressure medication was optimized, his kidney function improved.  He was subsequently referred to [facility], and as part of his work-up for renal dysfunction, it was incidentally discovered that he had significantly elevated serum lambda light chain without an identifiable M spike.  Patient was subsequently referred to hematology for further evaluation of possible light chain myeloma.  Patient reports stable chronic bilateral hearing problem since [place] (right ear hearing better than the left), a weight gain of approximately 6 pounds over the past 39-month chronic joint pain in the hands and ankle due to arthritis, and occasional tingling in the hands, but he denies fever, night sweats, unintentional weight loss, chest pain, shortness of breath, abdominal pain, dysuria,  hematuria, or disproportionate bone pain.  He lives by himself, and he remains very active, including dancing.  His family lives close by and provides ample support.  I have reviewed his chart and materials related to his cancer extensively and collaborated history with the patient. Summary of oncologic history is as follows:  No history exists.    MEDICAL HISTORY:  Past Medical History:  Diagnosis Date  . Blood transfusion   . Gout   . Hearing loss   . Hypertension   . Hypertriglyceridemia   . Hypothyroid   . Prediabetes   . Prostate cancer (HClay   . Pulmonary nodule   . Tinnitus     SURGICAL HISTORY: Past Surgical History:  Procedure Laterality Date  . HERNIA REPAIR    . left inguinal hernia repair  06/29/2008   with mesh sail  . PROSTATECTOMY     prostate cancer  . right ankle     fracture  . right knee     torn meniscus    SOCIAL HISTORY: Social History   Socioeconomic History  . Marital status: Widowed    Spouse name: Not on file  . Number of children: Not on file  . Years of education: Not on file  . Highest education level: Not on file  Occupational History  . Not on file  Social Needs  . Financial resource strain: Not on file  . Food insecurity:    Worry: Not on file    Inability: Not on file  . Transportation needs:    Medical: Not on file    Non-medical: Not on file  Tobacco Use  . Smoking status: Former Smoker  Substance and Sexual Activity  . Alcohol use: Yes  . Drug use: No  . Sexual activity: Not on file  Lifestyle  . Physical activity:    Days per week: Not on file    Minutes per session: Not on file  . Stress: Not on file  Relationships  . Social connections:    Talks on phone: Not on file    Gets together: Not on file    Attends religious service: Not on file    Active member of club or organization: Not on file    Attends meetings of clubs or organizations: Not on file    Relationship status: Not on file  . Intimate partner  violence:    Fear of current or ex partner: Not on file    Emotionally abused: Not on file    Physically abused: Not on file    Forced sexual activity: Not on file  Other Topics Concern  . Not on file  Social History Narrative  . Not on file    FAMILY HISTORY: History reviewed. No pertinent family history.  ALLERGIES:  has No Known Allergies.  MEDICATIONS:  Current Outpatient Medications  Medication Sig Dispense Refill  . aspirin 81 MG tablet Take 81 mg by mouth daily.      Marland Kitchen atenolol (TENORMIN) 50 MG tablet Take 50 mg by mouth daily.      . Vitamin D, Ergocalciferol, (DRISDOL) 50000 units CAPS capsule Take 50,000 Units by mouth every 7 (seven) days. X 4 weeks.    . ALPRAZolam (XANAX) 0.5 MG tablet Take 0.5 mg by mouth 3 (three) times daily as needed.      . cyanocobalamin (,VITAMIN B-12,) 1000 MCG/ML injection Inject 1 mL (1,000 mcg total) into the muscle See admin instructions for 14 doses. Weekly x 4, and then monthly thereafter 14 mL 0  . levothyroxine (SYNTHROID, LEVOTHROID) 100 MCG tablet      No current facility-administered medications for this visit.     REVIEW OF SYSTEMS:   Constitutional: ( - ) fevers, ( - )  chills , ( - ) night sweats Eyes: ( - ) blurriness of vision, ( - ) double vision, ( - ) watery eyes Ears, nose, mouth, throat, and face: ( - ) mucositis, ( - ) sore throat Respiratory: ( - ) cough, ( - ) dyspnea, ( - ) wheezes Cardiovascular: ( - ) palpitation, ( - ) chest discomfort, ( - ) lower extremity swelling Gastrointestinal:  ( - ) nausea, ( - ) heartburn, ( - ) change in bowel habits Skin: ( - ) abnormal skin rashes Lymphatics: ( - ) new lymphadenopathy, ( - ) easy bruising Neurological: ( - ) numbness, ( - ) new weaknesses Behavioral/Psych: ( - ) mood change, ( - ) new changes  All other systems were reviewed with the patient and are negative.  PHYSICAL EXAMINATION: ECOG PERFORMANCE STATUS: 0 - Asymptomatic  Vitals:   09/06/18 1315  BP: (!)  160/72  Pulse: (!) 52  Resp: 18  Temp: 97.6 F (36.4 C)  SpO2: 100%   Filed Weights   09/06/18 1315  Weight: 153 lb 12.8 oz (69.8 kg)    GENERAL: alert, no distress and comfortable SKIN: skin color, texture, turgor are normal, no rashes or significant lesions EYES: conjunctiva are pink and non-injected, sclera clear OROPHARYNX: no exudate, no erythema; lips, buccal mucosa, and tongue normal  NECK: supple, non-tender LYMPH:  no palpable lymphadenopathy in the cervical, axillary or inguinal LUNGS: clear to auscultation and percussion with normal breathing effort HEART: regular rate & rhythm and no murmurs and no lower extremity edema ABDOMEN: soft, non-tender, non-distended, normal bowel sounds Musculoskeletal: no cyanosis of digits and no clubbing  PSYCH: alert & oriented x 3, fluent speech NEURO: no focal motor/sensory deficits  LABORATORY DATA:  I have reviewed the data as listed Lab Results  Component Value Date   WBC 8.7 09/13/2018   HGB 11.7 (L) 09/13/2018   HCT 34.5 (L) 09/13/2018   MCV 116.7 (H) 09/13/2018   PLT 216 09/13/2018   Lab Results  Component Value Date   NA 142 09/06/2018   K 4.8 09/06/2018   CL 108 09/06/2018   CO2 26 09/06/2018    RADIOGRAPHIC STUDIES: I have personally reviewed the radiological images as listed and agreed with the findings in the report. US Renal  Result Date: 08/24/2018 CLINICAL DATA:  84 year old male with chronic kidney disease. Initial encounter. EXAM: RENAL / URINARY TRACT ULTRASOUND COMPLETE COMPARISON:  None. FINDINGS: Right Kidney: Length: 9.5 cm. Echogenicity within normal limits. No mass or hydronephrosis visualized. Left Kidney: Length: 9.1 cm. Echogenicity within normal limits. No mass or hydronephrosis visualized. Bladder: Evaluation limited by under distension and bowel gas. No gross abnormality detected. IMPRESSION: 1. Kidneys unremarkable. 2. Limited evaluation of urinary bladder secondary to under distension and bowel  gas. Electronically Signed   By: Genia Del M.D.   On: 08/24/2018 19:45   Dg Bone Survey Met  Result Date: 09/09/2018 CLINICAL DATA:  Lambda type light chain disease. EXAM: METASTATIC BONE SURVEY COMPARISON:  Chest x-ray 09/26/2011 FINDINGS: No focal lytic or sclerotic lesions. Mild diffuse osteopenia. Degenerative changes throughout the spine. Degenerative changes over the appendicular skeleton. Small vessel atherosclerotic disease is present. Two orthopedic screws intact over the distal right tibia. Loose body over the left glenohumeral joint. IMPRESSION: No focal lytic or sclerotic lesions. Degenerative changes as described. Electronically Signed   By: Marin Olp M.D.   On: 09/09/2018 16:18

## 2018-09-06 NOTE — Assessment & Plan Note (Signed)
Creatinine 2.1 today, stable compared to recent lab checks at his nephrologist office. Recent renal ultrasound in September 2019 did not demonstrate any abnormality. Myeloma labs pending today.  Bone marrow biopsy to be scheduled as discussed above. I encouraged patient to maintain adequate p.o. hydration and to avoid nephrotoxic medications, including NSAIDs. Continue follow-up with the patient nephrologist as scheduled.

## 2018-09-06 NOTE — Assessment & Plan Note (Addendum)
Hemoglobin 11.4 with MCV of 112 today, asymptomatic. Patient reports of family history of hemochromatosis; however, his iron profile is relatively normal with borderline low iron saturation, arguing against hemochromatosis. Folate normal, but B12 noted to be severely depleted (<50); the patient is asymptomatic except occasional neuropathy and does not have any other neurologic deficits.  We will check intrinsic factor and parietal cell antibodies at the next visit.  He will need B12 IM injection weekly x 4, and then monthly thereafter. We will need to monitor his B12 level closely to ensure that it improves with injection. No indication for transfusion or ESA. He will require bone marrow biopsy as discussed above to assess for any underlying multiple myeloma.

## 2018-09-06 NOTE — Assessment & Plan Note (Signed)
BP slightly high today (160/72).  The patient reports that his BP has been relatively well controlled at home since recent medication adjustment by his PCP. I encouraged the patient to follow-up with his primary care physician closely, given his episode of labile blood pressure in February 2019.

## 2018-09-07 ENCOUNTER — Telehealth: Payer: Self-pay | Admitting: Hematology

## 2018-09-07 LAB — VITAMIN D 25 HYDROXY (VIT D DEFICIENCY, FRACTURES): Vit D, 25-Hydroxy: 49.4 ng/mL (ref 30.0–100.0)

## 2018-09-07 LAB — KAPPA/LAMBDA LIGHT CHAINS
Kappa free light chain: 56.4 mg/L — ABNORMAL HIGH (ref 3.3–19.4)
Kappa, lambda light chain ratio: 0.01 — ABNORMAL LOW (ref 0.26–1.65)
Lambda free light chains: 4012.1 mg/L — ABNORMAL HIGH (ref 5.7–26.3)

## 2018-09-07 LAB — BETA 2 MICROGLOBULIN, SERUM: Beta-2 Microglobulin: 5.9 mg/L — ABNORMAL HIGH (ref 0.6–2.4)

## 2018-09-07 NOTE — Telephone Encounter (Signed)
Scheduled appt per 9/24 sche message - pt daughter is aware of appts added  -

## 2018-09-07 NOTE — Addendum Note (Signed)
Addended by: Tish Men on: 09/07/2018 08:50 AM   Modules accepted: Orders

## 2018-09-08 LAB — MULTIPLE MYELOMA PANEL, SERUM
ALBUMIN/GLOB SERPL: 1.5 (ref 0.7–1.7)
ALPHA2 GLOB SERPL ELPH-MCNC: 0.7 g/dL (ref 0.4–1.0)
Albumin SerPl Elph-Mcnc: 3.4 g/dL (ref 2.9–4.4)
Alpha 1: 0.2 g/dL (ref 0.0–0.4)
B-Globulin SerPl Elph-Mcnc: 0.7 g/dL (ref 0.7–1.3)
Gamma Glob SerPl Elph-Mcnc: 0.9 g/dL (ref 0.4–1.8)
Globulin, Total: 2.4 g/dL (ref 2.2–3.9)
IGA: 54 mg/dL — AB (ref 61–437)
IGG (IMMUNOGLOBIN G), SERUM: 918 mg/dL (ref 700–1600)
IgM (Immunoglobulin M), Srm: 19 mg/dL (ref 15–143)
M Protein SerPl Elph-Mcnc: 0.1 g/dL — ABNORMAL HIGH
Total Protein ELP: 5.8 g/dL — ABNORMAL LOW (ref 6.0–8.5)

## 2018-09-09 ENCOUNTER — Telehealth: Payer: Self-pay | Admitting: *Deleted

## 2018-09-09 ENCOUNTER — Telehealth: Payer: Self-pay

## 2018-09-09 ENCOUNTER — Other Ambulatory Visit: Payer: Self-pay | Admitting: Hematology

## 2018-09-09 ENCOUNTER — Ambulatory Visit (HOSPITAL_COMMUNITY)
Admission: RE | Admit: 2018-09-09 | Discharge: 2018-09-09 | Disposition: A | Discharge status: Home / Self Care | Payer: Medicare Other | Source: Ambulatory Visit | Attending: Hematology | Admitting: Hematology

## 2018-09-09 ENCOUNTER — Other Ambulatory Visit: Payer: Self-pay | Admitting: *Deleted

## 2018-09-09 DIAGNOSIS — D519 Vitamin B12 deficiency anemia, unspecified: Secondary | ICD-10-CM

## 2018-09-09 DIAGNOSIS — C9000 Multiple myeloma not having achieved remission: Secondary | ICD-10-CM | POA: Diagnosis not present

## 2018-09-09 DIAGNOSIS — D8989 Other specified disorders involving the immune mechanism, not elsewhere classified: Secondary | ICD-10-CM | POA: Insufficient documentation

## 2018-09-09 DIAGNOSIS — D539 Nutritional anemia, unspecified: Secondary | ICD-10-CM

## 2018-09-09 DIAGNOSIS — M479 Spondylosis, unspecified: Secondary | ICD-10-CM | POA: Diagnosis not present

## 2018-09-09 MED ORDER — CYANOCOBALAMIN 1000 MCG/ML IJ SOLN: 1000.0000 ug | Freq: Once | INTRAMUSCULAR | Status: DC

## 2018-09-09 MED ORDER — CYANOCOBALAMIN 1000 MCG/ML IJ SOLN: 1000.0000 ug | Freq: Every day | INTRAMUSCULAR | 0 refills | Status: DC

## 2018-09-09 MED ORDER — CYANOCOBALAMIN 1000 MCG/ML IJ SOLN: 1000.0000 ug | INTRAMUSCULAR | 0 refills | Status: DC

## 2018-09-09 MED FILL — CYANOCOBALAMIN 1,000 MCG/ML: 1000 | 84 days supply | Qty: 6 | Fill #0

## 2018-09-09 NOTE — Telephone Encounter (Signed)
Patient is coming in on Monday to receive a B12 injection.  During that appointment the patient and caregiver is going to receive education on how to give injection in the home.  He will need B12 daily for 2 weeks and then weekly for 31months and then monthly thereafter.  Daughter acknowledged that she has given injections in the home for another family member before in the past.  She will be present for this appointment.

## 2018-09-09 NOTE — Addendum Note (Signed)
Addended by: Tish Men on: 09/09/2018 04:28 PM   Modules accepted: Orders

## 2018-09-09 NOTE — Assessment & Plan Note (Signed)
B12 level noted to be severely low (less than 50) in September 2019.  Patient does not have any worrisome neurologic symptom except occasional mild tingling in the hands. As documented above, patient will need IM weekly B12 injection x4 weeks, and then monthly thereafter. 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 We will check intrinsic factor and parietal cell antibodies at the next visit. Patient will need close monitoring of B12 levels to ensure adequate repletion.

## 2018-09-09 NOTE — Telephone Encounter (Signed)
Due to cost concerns for daily B12 Injections new orders wrote by Dr Maylon Peppers for once weekly for 4 weeks and then monthly thereafter.  Patient will still receive education for administration for them to be given in the home.

## 2018-09-09 NOTE — Telephone Encounter (Signed)
Per 9/26 injection added for 9/30 Provider verbal consent by RN Maudie Mercury)

## 2018-09-10 LAB — UPEP/UIFE/LIGHT CHAINS/TP, 24-HR UR
% BETA, Urine: 39.9 %
ALBUMIN, U: 9.6 %
ALPHA 1 URINE: 1.6 %
ALPHA 2 UR: 36.8 %
FREE LAMBDA LT CHAINS, UR: 1680 mg/L — AB (ref 0.24–6.66)
Free Kappa Lt Chains,Ur: 142 mg/L — ABNORMAL HIGH (ref 1.35–24.19)
Free Kappa/Lambda Ratio: 0.08 — ABNORMAL LOW (ref 2.04–10.37)
GAMMA GLOBULIN URINE: 12.1 %
M-SPIKE %, Urine: 22.3 % — ABNORMAL HIGH
M-Spike, Mg/24 Hr: 178 mg/24 hr — ABNORMAL HIGH
TOTAL VOLUME: 1250
Total Protein, Urine-Ur/day: 799 mg/24 hr — ABNORMAL HIGH (ref 30–150)
Total Protein, Urine: 63.9 mg/dL

## 2018-09-13 ENCOUNTER — Inpatient Hospital Stay: Payer: Medicare Other

## 2018-09-13 ENCOUNTER — Telehealth: Payer: Self-pay | Admitting: Adult Health

## 2018-09-13 ENCOUNTER — Inpatient Hospital Stay (HOSPITAL_BASED_OUTPATIENT_CLINIC_OR_DEPARTMENT_OTHER): Payer: Medicare Other | Admitting: Adult Health

## 2018-09-13 VITALS — BP 192/85 | HR 67 | Temp 98.4°F | Resp 18

## 2018-09-13 DIAGNOSIS — D539 Nutritional anemia, unspecified: Secondary | ICD-10-CM

## 2018-09-13 DIAGNOSIS — C9000 Multiple myeloma not having achieved remission: Secondary | ICD-10-CM | POA: Diagnosis not present

## 2018-09-13 DIAGNOSIS — D464 Refractory anemia, unspecified: Secondary | ICD-10-CM

## 2018-09-13 LAB — CBC WITH DIFFERENTIAL (CANCER CENTER ONLY)
BASOS PCT: 1 %
Basophils Absolute: 0.1 10*3/uL (ref 0.0–0.1)
Eosinophils Absolute: 0.7 10*3/uL — ABNORMAL HIGH (ref 0.0–0.5)
Eosinophils Relative: 8 %
HEMATOCRIT: 34.5 % — AB (ref 38.4–49.9)
Hemoglobin: 11.7 g/dL — ABNORMAL LOW (ref 13.0–17.1)
LYMPHS PCT: 24 %
Lymphs Abs: 2.1 10*3/uL (ref 0.9–3.3)
MCH: 39.5 pg — ABNORMAL HIGH (ref 27.2–33.4)
MCHC: 33.8 g/dL (ref 32.0–36.0)
MCV: 116.7 fL — ABNORMAL HIGH (ref 79.3–98.0)
MONO ABS: 0.7 10*3/uL (ref 0.1–0.9)
Monocytes Relative: 8 %
NEUTROS ABS: 5.2 10*3/uL (ref 1.5–6.5)
Neutrophils Relative %: 59 %
Platelet Count: 216 10*3/uL (ref 140–400)
RBC: 2.95 MIL/uL — ABNORMAL LOW (ref 4.20–5.82)
RDW: 16.8 % — AB (ref 11.0–14.6)
WBC Count: 8.7 10*3/uL (ref 4.0–10.3)

## 2018-09-13 MED ORDER — CYANOCOBALAMIN 1000 MCG/ML IJ SOLN
1000.0000 ug | Freq: Once | INTRAMUSCULAR | Status: AC
  Administered 2018-09-13: 1000 ug via INTRAMUSCULAR

## 2018-09-13 MED ORDER — CYANOCOBALAMIN 1000 MCG/ML IJ SOLN
INTRAMUSCULAR | Status: AC
  Filled 2018-09-13: qty 1

## 2018-09-13 NOTE — Progress Notes (Signed)
Provided education to pt and daughter regarding home IM injections of B12 and provided instructions on AVS. Added written note that pt daughter should wash hands thoroughly with soap and warm water prior to administration. Per Julianne Rice, RN, pt to p/u injection materials and medication at [facility].

## 2018-09-13 NOTE — Progress Notes (Signed)
Pt tolerated bone marrow biopsy well performed by Wilber Bihari, NP.  Pt site clean dry and intact.  Pt educated on how to monitor for signs of infection. PT  BP 193/88 but states he has not taken his blood pressure medication this morning due to the fact he had to get up so early for bone marrow biopsy.  Pt and daughter state he will take it immediately as soon as he gets home.  Discharge paperwork given to pt and discharged home with daughter

## 2018-09-13 NOTE — Progress Notes (Signed)
INDICATION: refractory anemia   Bone Marrow Biopsy and Aspiration Procedure Note   Informed consent was obtained and potential risks including bleeding, infection and pain were reviewed with the patient.  The patient's name, date of birth, identification, consent and allergies were verified prior to the start of procedure and time out was performed.  The left posterior iliac crest was chosen as the site of biopsy.  The skin was prepped with ChloraPrep.   10 cc of 2% lidocaine was used to provide local anaesthesia.   10 cc of bone marrow aspirate was obtained followed by 1cm biopsy.  Pressure was applied to the biopsy site and bandage was placed over the biopsy site. Patient was made to lie on the back for 30 mins prior to discharge.  The procedure was tolerated well. COMPLICATIONS: None BLOOD LOSS: none The patient was discharged home in stable condition with a 1 week follow up to review results.  Patient was provided with post bone marrow biopsy instructions and instructed to call if there was any bleeding or worsening pain.  Specimens sent for flow cytometry, cytogenetics and additional studies.  Signed Scot Dock, NP

## 2018-09-13 NOTE — Patient Instructions (Signed)
Home Injection  Attach needle to empty syringe. Pull back on plunger until there is same volume of air in syringe as volume of medication noted on side of vial. Stick needle into vial and push down on plunger to instill air.Pull back on plunger to draw out complete dose of B12. Remove needle from vial carefully and Hold vertically so that any air comes to the top. Push plunger gently until all air is removed. Clean lateral shoulder, below shoulder bone, with chlorhexidine or alcohol for 10 seconds back and forth. Allow skin to dry.   To inject, place two fingers of non-dominant hand just below shoulder bone. Stick needle at 90 degree angle quickly into deltoid (shoulder) muscle. Inject full volume of medication into arm. Remove needle and close safety device. Place band-aid on site for 3 hours or until patient prefers to remove. Place needle in sharps container (home-made option: used laundry detergent bottle - can be thrown in trash once full if top screwed on).   Please call [phone] if you have any further questions.  Cyanocobalamin, Vitamin B12 injection What is this medicine? CYANOCOBALAMIN (sye an oh koe BAL a min) is a man made form of vitamin B12. Vitamin B12 is used in the growth of healthy blood cells, nerve cells, and proteins in the body. It also helps with the metabolism of fats and carbohydrates. This medicine is used to treat people who can not absorb vitamin B12. This medicine may be used for other purposes; ask your health care provider or pharmacist if you have questions. COMMON BRAND NAME(S): B-12 Compliance Kit, B-12 Injection Kit, Cyomin, LA-12, Nutri-Twelve, Physicians EZ Use B-12, Primabalt What should I tell my health care provider before I take this medicine? They need to know if you have any of these conditions: -kidney disease -Leber's disease -megaloblastic anemia -an unusual or allergic reaction to cyanocobalamin, cobalt, other medicines, foods, dyes, or  preservatives -pregnant or trying to get pregnant -breast-feeding How should I use this medicine? This medicine is injected into a muscle or deeply under the skin. It is usually given by a health care professional in a clinic or doctor's office. However, your doctor may teach you how to inject yourself. Follow all instructions. Talk to your pediatrician regarding the use of this medicine in children. Special care may be needed. Overdosage: If you think you have taken too much of this medicine contact a poison control center or emergency room at once. NOTE: This medicine is only for you. Do not share this medicine with others. What if I miss a dose? If you are given your dose at a clinic or doctor's office, call to reschedule your appointment. If you give your own injections and you miss a dose, take it as soon as you can. If it is almost time for your next dose, take only that dose. Do not take double or extra doses. What may interact with this medicine? -colchicine -heavy alcohol intake This list may not describe all possible interactions. Give your health care provider a list of all the medicines, herbs, non-prescription drugs, or dietary supplements you use. Also tell them if you smoke, drink alcohol, or use illegal drugs. Some items may interact with your medicine. What should I watch for while using this medicine? Visit your doctor or health care professional regularly. You may need blood work done while you are taking this medicine. You may need to follow a special diet. Talk to your doctor. Limit your alcohol intake and avoid smoking  to get the best benefit. What side effects may I notice from receiving this medicine? Side effects that you should report to your doctor or health care professional as soon as possible: -allergic reactions like skin rash, itching or hives, swelling of the face, lips, or tongue -blue tint to skin -chest tightness, pain -difficulty breathing,  wheezing -dizziness -red, swollen painful area on the leg Side effects that usually do not require medical attention (report to your doctor or health care professional if they continue or are bothersome): -diarrhea -headache This list may not describe all possible side effects. Call your doctor for medical advice about side effects. You may report side effects to FDA at 1-800-FDA-1088. Where should I keep my medicine? Keep out of the reach of children. Store at room temperature between 15 and 30 degrees C (59 and 85 degrees F). Protect from light. Throw away any unused medicine after the expiration date. NOTE: This sheet is a summary. It may not cover all possible information. If you have questions about this medicine, talk to your doctor, pharmacist, or health care provider.  2018 Elsevier/Gold Standard (2008-03-13 22:10:20)

## 2018-09-13 NOTE — Telephone Encounter (Signed)
Per 9/30 los, no orders or referrals.

## 2018-09-13 NOTE — Patient Instructions (Signed)
Bone Marrow Aspiration and Bone Marrow Biopsy, Adult, Care After This sheet gives you information about how to care for yourself after your procedure. Your health care provider may also give you more specific instructions. If you have problems or questions, contact your health care provider. What can I expect after the procedure? After the procedure, it is common to have:  Mild pain and tenderness.  Swelling.  Bruising.  Follow these instructions at home:  Take over-the-counter or prescription medicines only as told by your health care provider.  Do not take baths, swim, or use a hot tub until your health care provider approves. Ask if you can take a shower or have a sponge bath.  Follow instructions from your health care provider about how to take care of the puncture site. Make sure you: ? Wash your hands with soap and water before you change your bandage (dressing). If soap and water are not available, use hand sanitizer. ? Change your dressing as told by your health care provider.  Check your puncture siteevery day for signs of infection. Check for: ? More redness, swelling, or pain. ? More fluid or blood. ? Warmth. ? Pus or a bad smell.  Return to your normal activities as told by your health care provider. Ask your health care provider what activities are safe for you.  Do not drive for 24 hours if you were given a medicine to help you relax (sedative).  Keep all follow-up visits as told by your health care provider. This is important. Contact a health care provider if:  You have more redness, swelling, or pain around the puncture site.  You have more fluid or blood coming from the puncture site.  Your puncture site feels warm to the touch.  You have pus or a bad smell coming from the puncture site.  You have a fever.  Your pain is not controlled with medicine. This information is not intended to replace advice given to you by your health care provider. Make sure  you discuss any questions you have with your health care provider. Document Released: 06/20/2005 Document Revised: 06/20/2016 Document Reviewed: 05/14/2016 Elsevier Interactive Patient Education  2018 [employer].

## 2018-09-15 NOTE — Addendum Note (Signed)
Addended by: Tish Men on: 09/15/2018 03:32 PM   Modules accepted: Orders

## 2018-09-16 ENCOUNTER — Other Ambulatory Visit: Payer: Self-pay | Admitting: Hematology

## 2018-09-16 DIAGNOSIS — N183 Chronic kidney disease, stage 3 unspecified: Secondary | ICD-10-CM

## 2018-09-16 DIAGNOSIS — D519 Vitamin B12 deficiency anemia, unspecified: Secondary | ICD-10-CM

## 2018-09-16 DIAGNOSIS — D8989 Other specified disorders involving the immune mechanism, not elsewhere classified: Secondary | ICD-10-CM

## 2018-09-20 ENCOUNTER — Other Ambulatory Visit: Payer: Self-pay | Admitting: Hematology

## 2018-09-20 NOTE — Assessment & Plan Note (Addendum)
Bone marrow biopsy on 09/13/2018 showed 12% lambda restricted plasma cells, consistent with plasma cell dyscrasia. In light of renal dysfunction, this is consistent with lambda light chain myeloma. Metastatic skeletal survey did not show any lytic bone lesion; while PET scan has increased sensitivity, it would not change treatment and therefore we will not pursue PET at this time.  I discussed with the patient and his daughter regarding various frontline myeloma therapy options, including Revlimid/Velcade/dexamethasone and ixazomib/Revlimd/dexamethasone.  Given patient's age and medical comorbidities, weekly commute to the cancer center for injection is suboptimal; therefore, we will proceed with ixazomib/Revlimid/dexamethasone. Given patient's renal dysfunction, the starting regimen is as follow:  Ixazomib '3mg'$  weekly on Days 1, 8, and 15 of q28day cycle  Revlimid '10mg'$  daily Days 1-21 of q28day cycle  Dexamethasone '10mg'$  weekly on Days 1, 8, 15 and 22 Prophylaxis: acyclovir '400mg'$  BID, ASA '81mg'$  daily. The case was discussed oral chemo pharmacist, who will contact the patient to assist with Revlimid approval.

## 2018-09-20 NOTE — Assessment & Plan Note (Signed)
Creatinine currently stable ~2. Myeloma regimen adjusted based on renal function. I educated the patient on avoiding nephrotoxins, including NSAIDs. I also encouraged patient to follow up closely with his nephrologist.

## 2018-09-20 NOTE — Assessment & Plan Note (Signed)
Bone marrow biopsy on 09/13/2018 showed megaloblastic changes, consistent with severe B12 deficiency. Patient was started on weekly B12 injection on 09/13/2018. We we will plan for weekly IM B12 injection x4 doses, followed by monthly thereafter. The patient's B12 level will need to be monitored closely.

## 2018-09-22 ENCOUNTER — Other Ambulatory Visit: Payer: Self-pay

## 2018-09-22 ENCOUNTER — Inpatient Hospital Stay: Payer: Medicare Other | Attending: Hematology | Admitting: Hematology

## 2018-09-22 ENCOUNTER — Inpatient Hospital Stay: Payer: Medicare Other

## 2018-09-22 ENCOUNTER — Encounter: Payer: Self-pay | Admitting: Hematology

## 2018-09-22 VITALS — BP 183/79 | HR 52 | Temp 98.3°F | Resp 18 | Ht 67.0 in | Wt 154.0 lb

## 2018-09-22 DIAGNOSIS — C9000 Multiple myeloma not having achieved remission: Secondary | ICD-10-CM

## 2018-09-22 DIAGNOSIS — D519 Vitamin B12 deficiency anemia, unspecified: Secondary | ICD-10-CM | POA: Diagnosis not present

## 2018-09-22 DIAGNOSIS — D539 Nutritional anemia, unspecified: Secondary | ICD-10-CM

## 2018-09-22 DIAGNOSIS — Z23 Encounter for immunization: Secondary | ICD-10-CM

## 2018-09-22 DIAGNOSIS — N183 Chronic kidney disease, stage 3 unspecified: Secondary | ICD-10-CM

## 2018-09-22 DIAGNOSIS — D8989 Other specified disorders involving the immune mechanism, not elsewhere classified: Secondary | ICD-10-CM

## 2018-09-22 LAB — CBC WITH DIFFERENTIAL (CANCER CENTER ONLY)
Abs Immature Granulocytes: 0.06 10*3/uL (ref 0.00–0.07)
BASOS ABS: 0.1 10*3/uL (ref 0.0–0.1)
Basophils Relative: 1 %
Eosinophils Absolute: 0.7 10*3/uL — ABNORMAL HIGH (ref 0.0–0.5)
Eosinophils Relative: 7 %
HCT: 35 % — ABNORMAL LOW (ref 39.0–52.0)
Hemoglobin: 12 g/dL — ABNORMAL LOW (ref 13.0–17.0)
Immature Granulocytes: 1 %
LYMPHS PCT: 27 %
Lymphs Abs: 3 10*3/uL (ref 0.7–4.0)
MCH: 38.8 pg — ABNORMAL HIGH (ref 26.0–34.0)
MCHC: 34.3 g/dL (ref 30.0–36.0)
MCV: 113.3 fL — ABNORMAL HIGH (ref 80.0–100.0)
Monocytes Absolute: 0.7 10*3/uL (ref 0.1–1.0)
Monocytes Relative: 7 %
NRBC: 0 % (ref 0.0–0.2)
Neutro Abs: 6.5 10*3/uL (ref 1.7–7.7)
Neutrophils Relative %: 57 %
Platelet Count: 302 10*3/uL (ref 150–400)
RBC: 3.09 MIL/uL — ABNORMAL LOW (ref 4.22–5.81)
RDW: 13.8 % (ref 11.5–15.5)
WBC Count: 11.1 10*3/uL — ABNORMAL HIGH (ref 4.0–10.5)

## 2018-09-22 LAB — BASIC METABOLIC PANEL - CANCER CENTER ONLY
Anion gap: 9 (ref 5–15)
BUN: 22 mg/dL (ref 8–23)
CO2: 25 mmol/L (ref 22–32)
CREATININE: 1.76 mg/dL — AB (ref 0.61–1.24)
Calcium: 8.9 mg/dL (ref 8.9–10.3)
Chloride: 107 mmol/L (ref 98–111)
GFR, Est AFR Am: 38 mL/min — ABNORMAL LOW (ref >60)
GFR, Estimated: 33 mL/min — ABNORMAL LOW (ref >60)
Glucose, Bld: 83 mg/dL (ref 70–99)
Potassium: 5 mmol/L (ref 3.5–5.1)
Sodium: 141 mmol/L (ref 135–145)

## 2018-09-22 LAB — VITAMIN B12: Vitamin B-12: 2984 pg/mL — ABNORMAL HIGH (ref 180–914)

## 2018-09-22 MED ORDER — DEXAMETHASONE 4 MG PO TABS: 10.0000 mg | ORAL_TABLET | ORAL | 6 refills | Status: DC

## 2018-09-22 MED ORDER — CYANOCOBALAMIN 1000 MCG/ML IJ SOLN
INTRAMUSCULAR | Status: AC
  Filled 2018-09-22: qty 1

## 2018-09-22 MED ORDER — INFLUENZA VAC SPLIT QUAD 0.5 ML IM SUSY
0.5000 mL | PREFILLED_SYRINGE | Freq: Once | INTRAMUSCULAR | Status: AC
  Administered 2018-09-22: 0.5 mL via INTRAMUSCULAR

## 2018-09-22 MED ORDER — INFLUENZA VAC SPLIT QUAD 0.5 ML IM SUSY
PREFILLED_SYRINGE | INTRAMUSCULAR | Status: AC
  Filled 2018-09-22: qty 0.5

## 2018-09-22 MED ORDER — IXAZOMIB CITRATE 3 MG PO CAPS: 3.0000 mg | ORAL_CAPSULE | ORAL | 6 refills | Status: DC

## 2018-09-22 MED ORDER — LENALIDOMIDE 10 MG PO CAPS: 10.0000 mg | ORAL_CAPSULE | Freq: Every day | ORAL | 0 refills | Status: DC

## 2018-09-22 MED ORDER — DEXAMETHASONE 4 MG PO TABS: 10.0000 mg | ORAL_TABLET | ORAL | 0 refills | Status: AC

## 2018-09-22 MED ORDER — ACYCLOVIR 400 MG PO TABS: 400.0000 mg | ORAL_TABLET | Freq: Two times a day (BID) | ORAL | 3 refills | Status: AC

## 2018-09-22 MED FILL — DEXAMETHASONE 4 MG TABLET: 4 | 22 days supply | Qty: 8 | Fill #0

## 2018-09-22 MED FILL — ACYCLOVIR 400 MG TABLET: 400 | 30 days supply | Qty: 60 | Fill #0

## 2018-09-22 NOTE — Progress Notes (Signed)
[place] OFFICE PROGRESS NOTE  Patient Care Team: Orpah Melter, MD as PCP - General (Family Medicine)  BRIEF ONCOLOGIC SUMMARY: 1. Lambda light chain myeloma, ISS Stage III -Baseline parameters: lambda 4012, kappa 56; M-spike 0.1; B2M 5.9; Hgb 11.5; Cr 2.1; no hypercalcemia -Treatment:   -1st line: dose-adjusted IRD (ixazomib '3mg'$ , Revlimid '10mg'$ , dex '10mg'$ )  ASSESSMENT & PLAN:  Lambda light chain myeloma (HCC) Bone marrow biopsy on 09/13/2018 showed 12% lambda restricted plasma cells, consistent with plasma cell dyscrasia. In light of renal dysfunction, this is consistent with lambda light chain myeloma. Metastatic skeletal survey did not show any lytic bone lesion; while PET scan has increased sensitivity, it would not change treatment and therefore we will not pursue PET at this time.  I discussed with the patient and his daughter regarding various frontline myeloma therapy options, including Revlimid/Velcade/dexamethasone and ixazomib/Revlimd/dexamethasone.  Given patient's age and medical comorbidities, weekly commute to the cancer center for injection is suboptimal; therefore, we will proceed with ixazomib/Revlimid/dexamethasone. Given patient's renal dysfunction, the starting regimen is as follow:  Ixazomib '3mg'$  weekly on Days 1, 8, and 15 of q28day cycle  Revlimid '10mg'$  daily Days 1-21 of q28day cycle  Dexamethasone '10mg'$  weekly on Days 1, 8, 15 and 22 Prophylaxis: acyclovir '400mg'$  BID, ASA '81mg'$  daily   B12 deficiency anemia Bone marrow biopsy on 09/13/2018 showed megaloblastic changes, consistent with severe B12 deficiency. Patient was started on weekly B12 injection on 09/13/2018. We we will plan for weekly IM B12 injection x4 doses, followed by monthly thereafter. The patient's B12 level will need to be monitored closely.   CKD (chronic kidney disease) stage 3, GFR 30-59 ml/min (HCC) Creatinine currently stable ~2. Myeloma regimen adjusted based on renal  function. I educated the patient on avoiding nephrotoxins, including NSAIDs. I also encouraged patient to follow up closely with his nephrologist.  Health Maintenance We discussed the importance of preventive care and reviewed the vaccination programs. He does not have any prior allergic reactions to influenza vaccination. He agrees to proceed with influenza vaccination today and we will administer it today at the clinic.   Orders Placed This Encounter  Procedures  . CBC with Differential (Cancer Center Only)    Standing Status:   Future    Standing Expiration Date:   10/27/2019  . CMP ([place] only)    Standing Status:   Future    Standing Expiration Date:   10/27/2019   All questions were answered. The patient knows to call the clinic with any problems, questions or concerns. No barriers to learning was detected.  A total of more than 25 minutes were spent face-to-face with the patient during this encounter and over half of that time was spent on counseling and coordination of care as outlined above.   Return to clinic in 3 weeks for labs and toxicity check on IRD at [facility].   Tish Men, MD 09/22/2018 3:01 PM  CHIEF COMPLAINT: "I am here for my test results"  INTERVAL HISTORY: Daniel [name] returns to clinic for follow-up of his recent bone marrow biopsy.  He was accompanied by his daughter.  He reports that since starting the B12 injection, he has noticed slightly more energy.  He denies any symptoms of numbness, tingling, or decreased strength in the extremities.  He reports that he has had some staggering of gait since February 2019, during which time his blood pressure medications were being adjusted, and he was hospitalized for acute renal failure,  possibly secondary to medication side effect.  He denies any worsening of his balance.  SUMMARY OF ONCOLOGIC HISTORY:   Lambda light chain myeloma ([place])   09/02/2018 Initial Diagnosis    Lambda light  chain myeloma ([place])    09/06/2018 Miscellaneous    Baseline labs:  Kappa 56, lambda 4012, ratio 0.01 SPEP: M-spike 0.1 g/dl, monoclonal lambda light chain UPEP: M-spike 178 mg/dl, Bence Jones Protein positive; lambda type. Beta-2 micro: 5.9 Normal immunoglogulin heavy chains Hgb 11.4 Creatinine 2.08 No hypercalcemia    09/09/2018 Imaging    Metastatic skeletal survey negative    09/13/2018 Bone Marrow Biopsy    Morphology (Accession: [id number]): Bone Marrow, Aspirate,Biopsy, and Clot - HYPERCELLULAR BONE MARROW WITH PLASMA CELL NEOPLASM (12% cells, IHC showed lambda light chain restriction) - TRILINEAGE HEMATOPOIESIS. - SEE COMMENT.  Flow cytometry (Accession: [id number]):  - NO MONOCLONAL B-CELL POPULATION IDENTIFIED. - PREDOMINANCE OF T LYMPHOCYTES AND NATURAL KILLER CELLS.     REVIEW OF SYSTEMS:   Constitutional: ( - ) fevers, ( - )  chills , ( - ) night sweats Eyes: ( - ) blurriness of vision, ( - ) double vision, ( - ) watery eyes Ears, nose, mouth, throat, and face: ( - ) mucositis, ( - ) sore throat Respiratory: ( - ) cough, ( - ) dyspnea, ( - ) wheezes Cardiovascular: ( - ) palpitation, ( - ) chest discomfort, ( - ) lower extremity swelling Gastrointestinal:  ( - ) nausea, ( - ) heartburn, ( - ) change in bowel habits Skin: ( - ) abnormal skin rashes Lymphatics: ( - ) new lymphadenopathy, ( - ) easy bruising Neurological: ( - ) numbness, ( - ) tingling, ( - ) new weaknesses Behavioral/Psych: ( - ) mood change, ( - ) new changes  All other systems were reviewed with the patient and are negative.  I have reviewed the past medical history, past surgical history, social history and family history with the patient and they are unchanged from previous note.  ALLERGIES:  has No Known Allergies.  MEDICATIONS:  Current Outpatient Medications  Medication Sig Dispense Refill  . aspirin 81 MG tablet Take 81 mg by mouth daily.      Marland Kitchen atenolol (TENORMIN) 50 MG tablet Take 50  mg by mouth daily.      . cholecalciferol (VITAMIN D) 1000 units tablet Take 1,000 Units by mouth daily. 2 tablets 2,000 IU daily    . cyanocobalamin (,VITAMIN B-12,) 1000 MCG/ML injection Inject 1 mL (1,000 mcg total) into the muscle See admin instructions for 14 doses. Weekly x 4, and then monthly thereafter 14 mL 0  . levothyroxine (SYNTHROID, LEVOTHROID) 100 MCG tablet     . acyclovir (ZOVIRAX) 400 MG tablet Take 1 tablet (400 mg total) by mouth 2 (two) times daily. 60 tablet 3  . ALPRAZolam (XANAX) 0.5 MG tablet Take 0.5 mg by mouth 3 (three) times daily as needed.      Marland Kitchen dexamethasone (DECADRON) 4 MG tablet Take 2.5 tablets (10 mg total) by mouth once a week for 3 doses. 7.5 tablet 6  . [START ON 09/27/2018] ixazomib citrate (NINLARO) 3 MG capsule Take 1 capsule (3 mg total) by mouth once a week for 3 doses. Take on an empty stomach 1hr before or 2hrs after food. Do not crush, chew, or open. 3 capsule 6  . lenalidomide (REVLIMID) 10 MG capsule Take 1 capsule (10 mg total) by mouth daily for 21 days. 21 capsule 0   No  current facility-administered medications for this visit.     PHYSICAL EXAMINATION: ECOG PERFORMANCE STATUS: 1 - Symptomatic but completely ambulatory  Vitals:   09/22/18 1349  BP: (!) 183/79  Pulse: (!) 52  Resp: 18  Temp: 98.3 F (36.8 C)  SpO2: 100%   Filed Weights   09/22/18 1349  Weight: 154 lb (69.9 kg)    GENERAL: alert, no distress and comfortable, well appearing SKIN: skin color, texture, turgor are normal, no rashes or significant lesions EYES: conjunctiva are pink and non-injected, sclera clear OROPHARYNX: no exudate, no erythema; lips, buccal mucosa, and tongue normal  NECK: supple, non-tender LYMPH:  no palpable lymphadenopathy in the cervical or axillary  LUNGS: clear to auscultation and percussion with normal breathing effort HEART: regular rate & rhythm and no murmurs and no lower extremity edema ABDOMEN: soft, non-tender, non-distended, normal  bowel sounds Musculoskeletal: no cyanosis of digits and no clubbing  PSYCH: alert & oriented x 3, fluent speech NEURO: no focal motor/sensory deficits, slightly slow gait   LABORATORY DATA:  I have reviewed the data as listed    Component Value Date/Time   NA 141 09/22/2018 1311   K 5.0 09/22/2018 1311   CL 107 09/22/2018 1311   CO2 25 09/22/2018 1311   GLUCOSE 83 09/22/2018 1311   BUN 22 09/22/2018 1311   CREATININE 1.76 (H) 09/22/2018 1311   CALCIUM 8.9 09/22/2018 1311   PROT 6.2 (L) 09/06/2018 1255   ALBUMIN 3.6 09/06/2018 1255   AST 18 09/06/2018 1255   ALT 7 09/06/2018 1255   ALKPHOS 70 09/06/2018 1255   BILITOT 0.4 09/06/2018 1255   GFRNONAA 33 (L) 09/22/2018 1311   GFRAA 38 (L) 09/22/2018 1311    No results found for: SPEP, UPEP  Lab Results  Component Value Date   WBC 11.1 (H) 09/22/2018   NEUTROABS 6.5 09/22/2018   HGB 12.0 (L) 09/22/2018   HCT 35.0 (L) 09/22/2018   MCV 113.3 (H) 09/22/2018   PLT 302 09/22/2018      Chemistry      Component Value Date/Time   NA 141 09/22/2018 1311   K 5.0 09/22/2018 1311   CL 107 09/22/2018 1311   CO2 25 09/22/2018 1311   BUN 22 09/22/2018 1311   CREATININE 1.76 (H) 09/22/2018 1311      Component Value Date/Time   CALCIUM 8.9 09/22/2018 1311   ALKPHOS 70 09/06/2018 1255   AST 18 09/06/2018 1255   ALT 7 09/06/2018 1255   BILITOT 0.4 09/06/2018 1255

## 2018-09-23 ENCOUNTER — Telehealth: Payer: Self-pay | Admitting: Pharmacy Technician

## 2018-09-23 ENCOUNTER — Telehealth: Payer: Self-pay | Admitting: Pharmacist

## 2018-09-23 ENCOUNTER — Encounter (HOSPITAL_COMMUNITY): Payer: Self-pay | Admitting: Hematology

## 2018-09-23 DIAGNOSIS — C9000 Multiple myeloma not having achieved remission: Secondary | ICD-10-CM

## 2018-09-23 DIAGNOSIS — N183 Chronic kidney disease, stage 3 unspecified: Secondary | ICD-10-CM

## 2018-09-23 LAB — ANTI-PARIETAL ANTIBODY: PARIETAL CELL ANTIBODY-IGG: 99.1 U — AB (ref 0.0–20.0)

## 2018-09-23 LAB — INTRINSIC FACTOR ANTIBODIES: INTRINSIC FACTOR: 15.7 [AU]/ml — AB (ref 0.0–1.1)

## 2018-09-23 MED ORDER — LENALIDOMIDE 10 MG PO CAPS: 10.0000 mg | ORAL_CAPSULE | Freq: Every day | ORAL | 0 refills | Status: DC

## 2018-09-23 MED ORDER — IXAZOMIB CITRATE 3 MG PO CAPS: 3.0000 mg | ORAL_CAPSULE | ORAL | 6 refills | Status: DC

## 2018-09-23 NOTE — Telephone Encounter (Signed)
Oral Oncology Patient Advocate Encounter  Prior Authorization for Revlimid has been approved.    PA# [id number] Effective dates: 09/23/18 through 12/15/2019  This information has been shared with [facility], which will be filling this medication for the patient.  Oral Oncology Clinic will continue to follow.   [place] Patient [place] Phone [phone] Fax [phone] 09/23/2018 12:34 PM

## 2018-09-23 NOTE — Telephone Encounter (Signed)
Oral Oncology Patient Advocate Encounter  Received notification from [employer] [facility] that prior authorization for Revlimid is required.  PA submitted on CoverMyMeds Key [id number] Status is pending  Oral Oncology Clinic will continue to follow.  [name] Phone [phone] Fax [phone] 09/23/2018 12:33 PM

## 2018-09-23 NOTE — Telephone Encounter (Signed)
Oral Oncology Patient Advocate Encounter  Prior Authorization for Daniel [name] has been approved.    PA# [id number] Effective dates: 09/23/18 through 12/15/2019  This information has been shared with [facility], which will be filling his prescription for Ninlaro.  Oral Oncology Clinic will continue to follow.   [name] Phone [phone] Fax [phone] 09/23/2018 12:29 PM

## 2018-09-23 NOTE — Telephone Encounter (Signed)
Oral Oncology Pharmacist Encounter  Received new prescription for Revlimid (lenalidomide) and Ninlaro (ixazomib) for the treatment of Lambda light chain myeloma in conjunction with dexamethasone, planned duration until disease progression or unacceptable drug toxicity.  CBC/BMP from 09/22/18 assessed, SCr elevated dose and ixazomib/lenalidomide  doses adjusted to reflected decreased CrCl. Continue to monitor renal function. Prescription dose and frequency assessed.   Current medication list in Epic reviewed, no relevant DDIs with lenalidomide or ixazomib identified.  Due to the limited distribution of lenalidomide, both prescription has been e-scribed to the [facility]. Patient advocate Dennison Nancy is completing the PA.   Oral Oncology Clinic will continue to follow for insurance authorization, copayment issues, initial counseling and start date.  Darl Pikes, PharmD, BCPS, [facility] Hematology/Oncology Clinical Pharmacist [facility] [place] [facility] [phone]  09/23/2018 9:04 AM

## 2018-09-23 NOTE — Telephone Encounter (Signed)
Oral Oncology Patient Advocate Encounter  Received notification from [employer] [facility] that prior authorization for [name] is required.  PA submitted on CoverMyMeds Key [id number] Status is pending  Oral Oncology Clinic will continue to follow.  [place] Patient [place] Phone [phone] Fax [phone] 09/23/2018 12:28 PM

## 2018-09-24 LAB — ZINC: Zinc: 71 ug/dL (ref 56–134)

## 2018-09-24 LAB — COPPER, SERUM: Copper: 105 ug/dL (ref 72–166)

## 2018-09-27 NOTE — Telephone Encounter (Signed)
Oral Chemotherapy Pharmacist Encounter   Mr. Martinique has been approved for both Revlimid and Ninlaro by his insurance. He will need copay assistance to cover the cost of the medication. [facility], the outside pharmacy filling this medication, has tried to contact the patient to set him up with finanical assistance. They have not been able to get the information they need to apply for finanicail assistance.  I attempted a call to the patient's daughter [name] to assist with this process, no answer, LVM for her to call me back.  Darl Pikes, PharmD, BCPS, [facility] Hematology/Oncology Clinical Pharmacist [facility] [place] [facility] [phone]  09/27/2018 2:33 PM

## 2018-09-28 NOTE — Telephone Encounter (Signed)
Oral Chemotherapy Pharmacist Encounter   I was able to get in touch the Daniel [name] (daughter) today. She stated that her father is not wanting to proceed with treatment at this time but that she family is trying to convince him to give it a try. She is hoping that his visit to his "kidney doctor" on 10/23 will change his mind. I told that we were here to assist if they do decide to pursue treatment and to let us know what his decision is so we can assist and plan accordingly.   Daniel [name], PharmD, BCPS, [facility] Hematology/Oncology Clinical Pharmacist [facility] [place] [facility] [phone]  09/28/2018 2:21 PM

## 2018-09-28 NOTE — Telephone Encounter (Signed)
Thank you for your help. Please keep me posted if you are able to get a hold of the family.   Eulas Post

## 2018-09-28 NOTE — Telephone Encounter (Signed)
Oral Chemotherapy Pharmacist Encounter   Attempted call to daughters [name] and [place]. No answer, LVM for them to call me back.  Darl Pikes, PharmD, BCPS, [facility] Hematology/Oncology Clinical Pharmacist [facility] [place] [facility] [phone]  09/28/2018 10:42 AM

## 2018-09-28 NOTE — Telephone Encounter (Signed)
Okay thank you for the update. Please keep me posted.

## 2018-10-11 ENCOUNTER — Telehealth: Payer: Self-pay

## 2018-10-11 ENCOUNTER — Telehealth: Payer: Self-pay | Admitting: Hematology

## 2018-10-11 NOTE — Telephone Encounter (Signed)
Received call to cancel new patient appt tomorrow/ Pt will call back to sch appt if decides to proceed with treatment. Pt has not made up his mind yet

## 2018-10-11 NOTE — Telephone Encounter (Signed)
Received a call from Harless Litten stating that the patient does not want to pursue treatment for his multiple myeloma at this time and to cancel tomorrows appointment. She stated that they will call back to schedule an appointment once the patient is ready to treat. MD and schedulers made aware.

## 2018-10-12 ENCOUNTER — Inpatient Hospital Stay: Payer: Medicare Other

## 2018-10-12 ENCOUNTER — Inpatient Hospital Stay: Payer: Medicare Other | Admitting: Hematology

## 2018-12-15 HISTORY — PX: CATARACT EXTRACTION: SUR2

## 2019-02-21 IMAGING — CR DG BONE SURVEY MET
9 of 10 series · 9 of 10 positions shown · non-contrast
Comparison: Chest x-ray 09/26/2011

CLINICAL DATA: Lambda type light chain disease.

EXAM:
METASTATIC BONE SURVEY

[w chest pa]
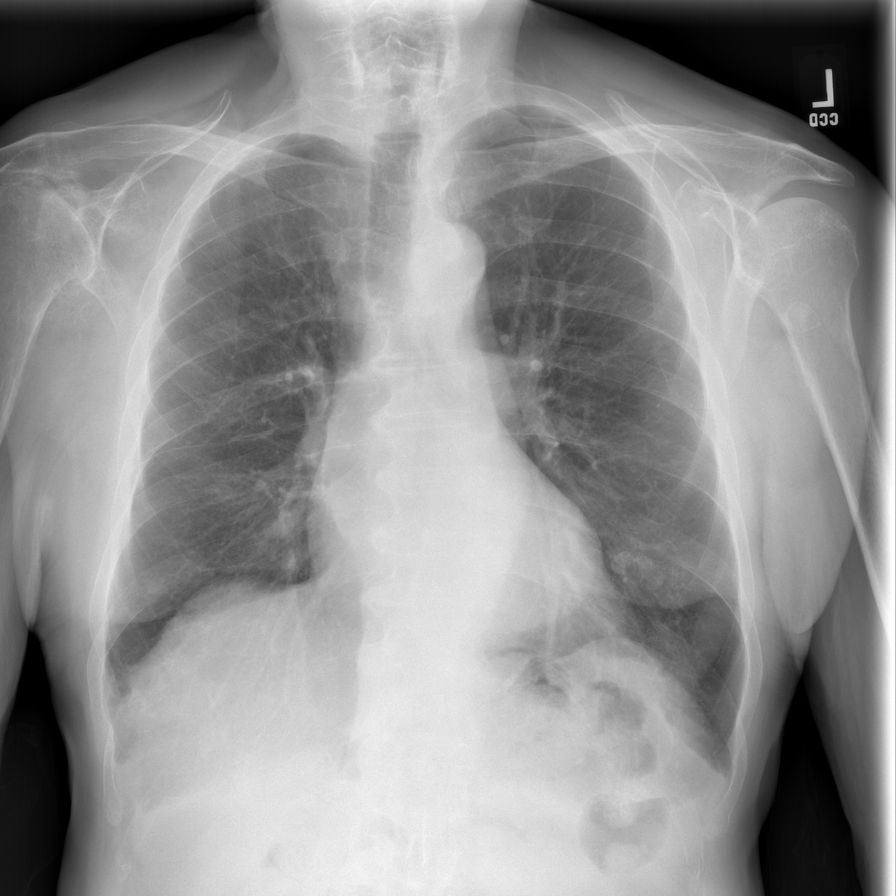

[w c-spine lat *]
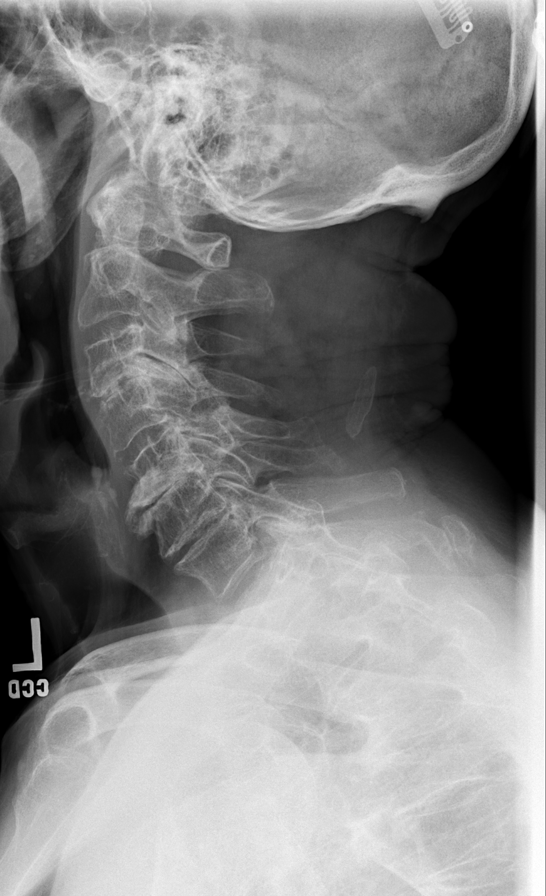

[w swimmers view *]
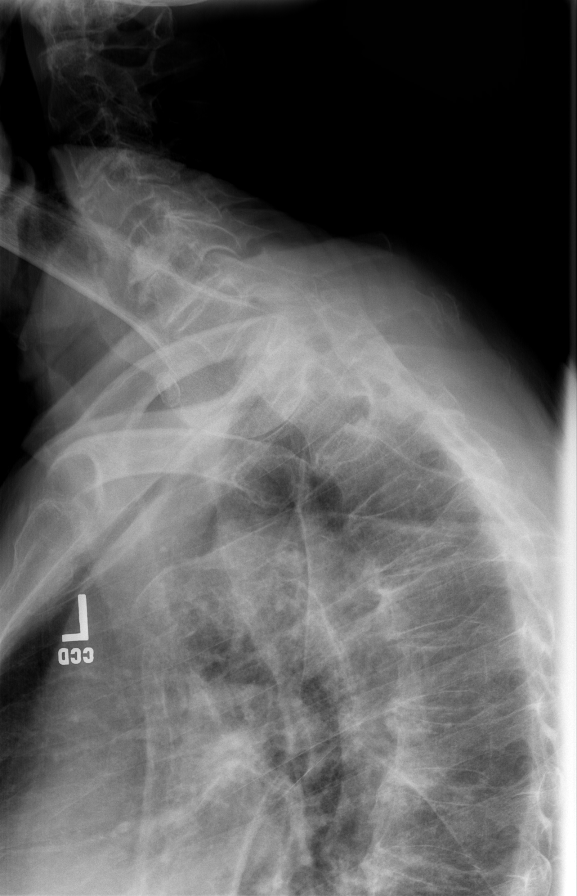

[w skull lat *]
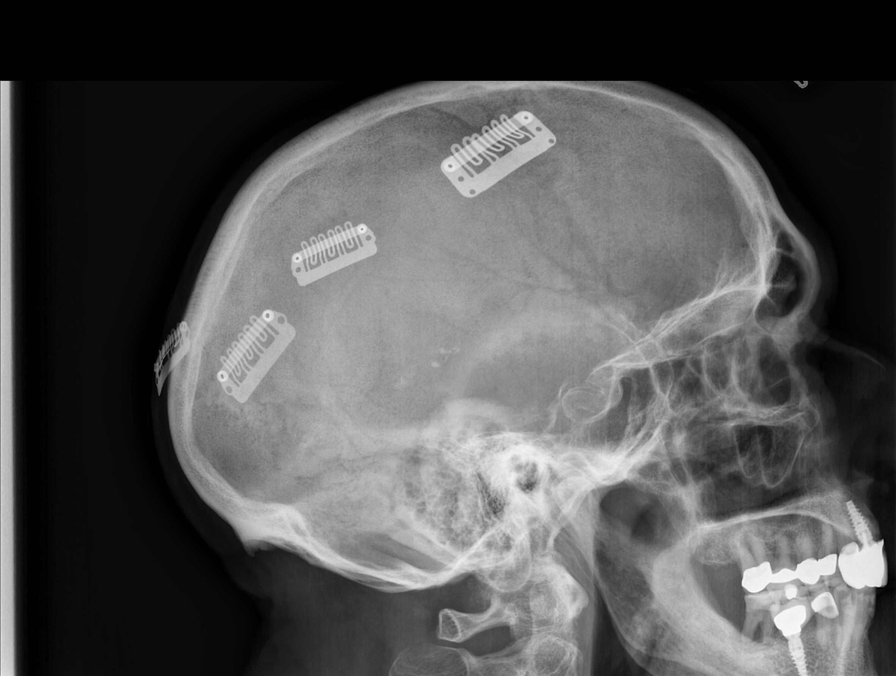

[w shoulder ap external left *]
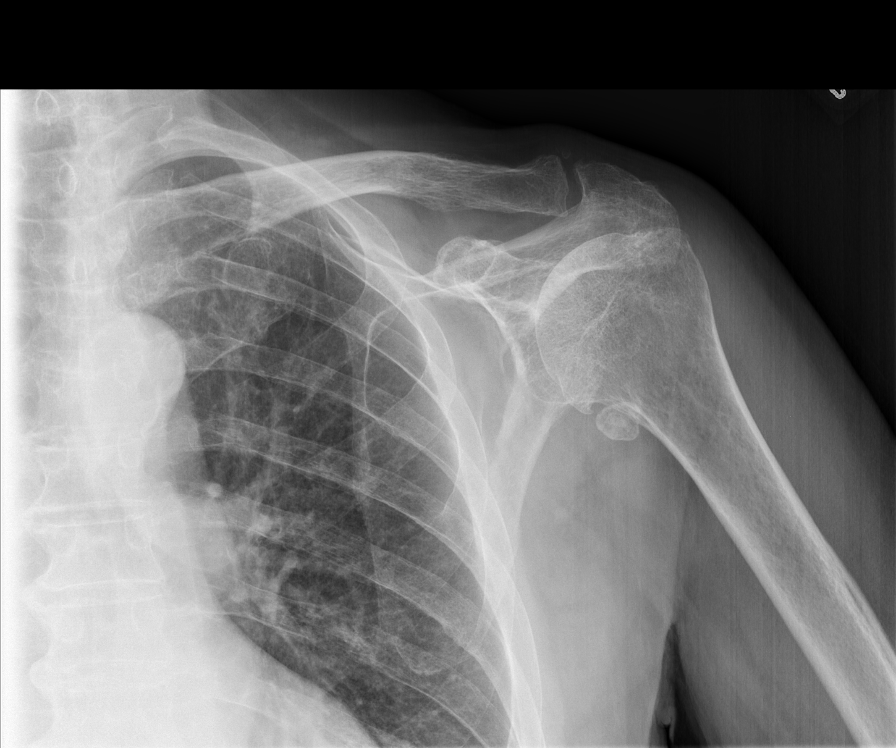

[w shoulder ap external right *]
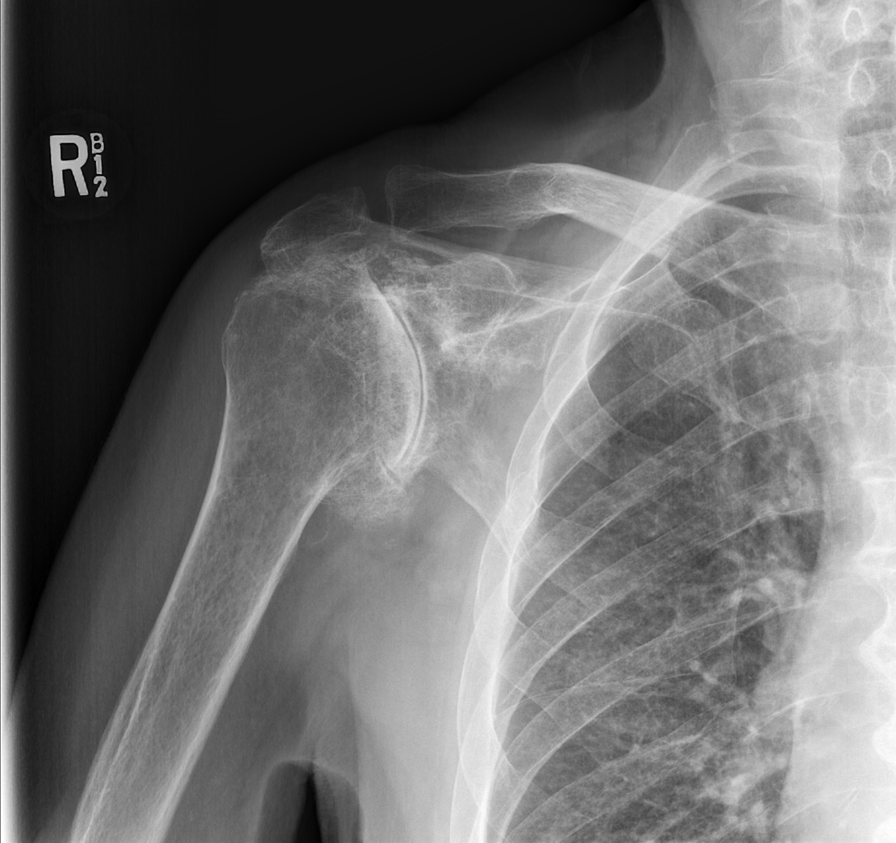

[w humerus ap left *]
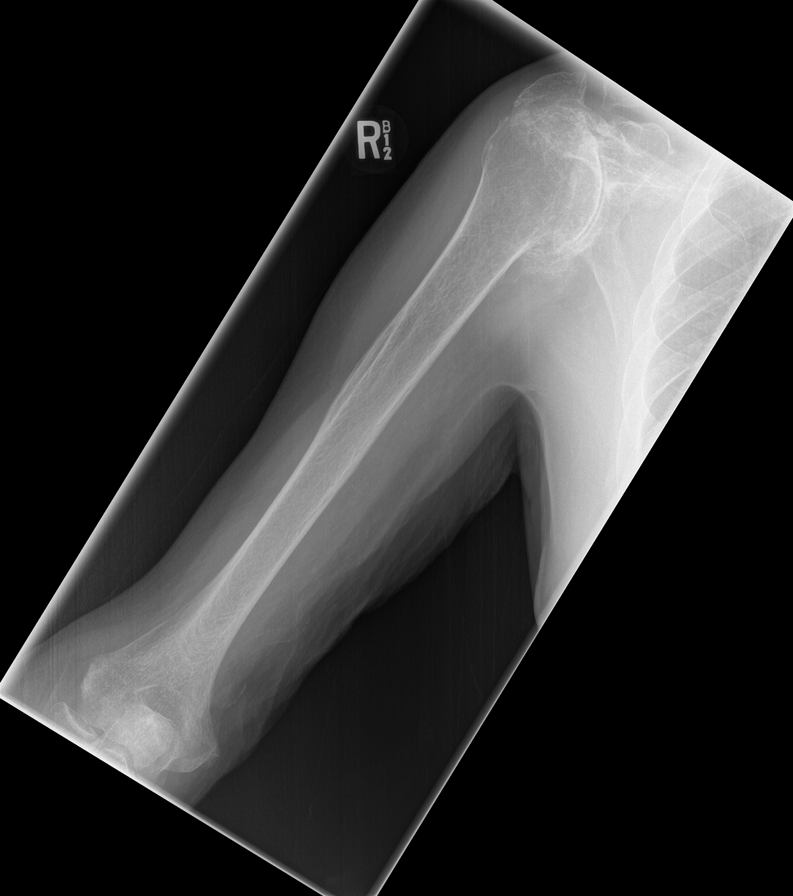

[w humerus ap right *]
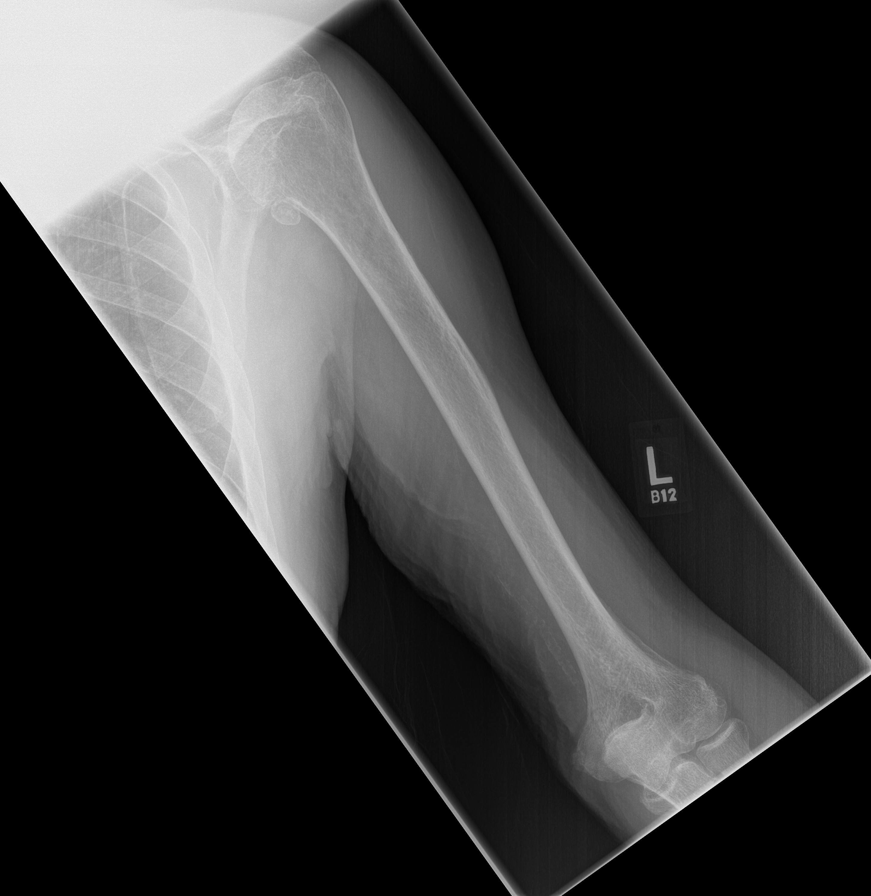

[x forearm ap left]
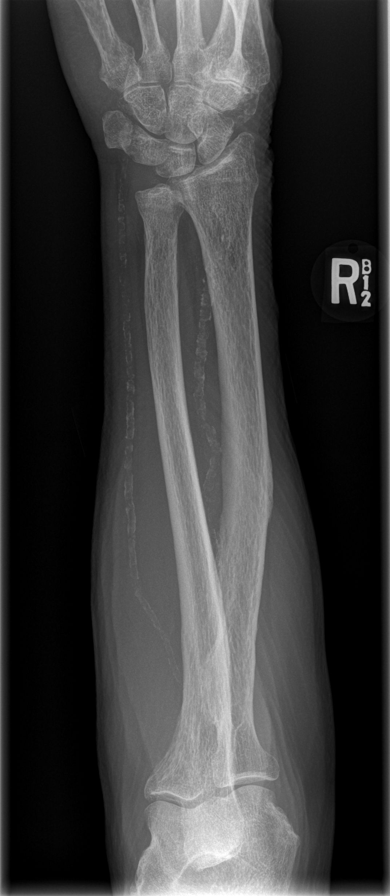

[9 of 10 positions shown; findings below may reference images not displayed]

FINDINGS: No focal lytic or sclerotic lesions. Mild diffuse osteopenia.
Degenerative changes throughout the spine. Degenerative changes over
the appendicular skeleton. Small vessel atherosclerotic disease is
present. Two orthopedic screws intact over the distal right tibia.
Loose body over the left glenohumeral joint.
IMPRESSION: No focal lytic or sclerotic lesions.

Degenerative changes as described.

## 2020-01-28 ENCOUNTER — Ambulatory Visit: Payer: Medicare Other | Attending: Internal Medicine

## 2020-01-28 DIAGNOSIS — Z23 Encounter for immunization: Secondary | ICD-10-CM | POA: Insufficient documentation

## 2020-01-28 NOTE — Progress Notes (Signed)
   Covid-19 Vaccination Clinic  Name:  Daniel [name]    MRN: [id number] DOB: 03-13-1927  01/28/2020  Daniel [name] was observed post Covid-19 immunization for 15 minutes without incidence. He was provided with Vaccine Information Sheet and instruction to access the V-Safe system.   Daniel [name] was instructed to call 911 with any severe reactions post vaccine: Marland Kitchen Difficulty breathing  . Swelling of your face and throat  . A fast heartbeat  . A bad rash all over your body  . Dizziness and weakness    Immunizations Administered    Name Date Dose VIS Date Route   Pfizer COVID-19 Vaccine 01/28/2020  2:19 PM 0.3 mL 11/25/2019 Intramuscular   Manufacturer: [place]   Lot: [id number]   [place]: [address]-7

## 2020-02-20 ENCOUNTER — Ambulatory Visit: Payer: Medicare Other | Attending: Internal Medicine

## 2020-02-20 DIAGNOSIS — Z23 Encounter for immunization: Secondary | ICD-10-CM | POA: Insufficient documentation

## 2020-02-20 NOTE — Progress Notes (Signed)
   Covid-19 Vaccination Clinic  Name:  Daniel [name]    MRN: [id number] DOB: 1927/10/14  02/20/2020  [name] was observed post Covid-19 immunization for 15 minutes without incident. He was provided with Vaccine Information Sheet and instruction to access the V-Safe system.   [name] was instructed to call 911 with any severe reactions post vaccine: Marland Kitchen Difficulty breathing  . Swelling of face and throat  . A fast heartbeat  . A bad rash all over body  . Dizziness and weakness   Immunizations Administered    Name Date Dose VIS Date Route   Pfizer COVID-19 Vaccine 02/20/2020 12:08 PM 0.3 mL 11/25/2019 Intramuscular   Manufacturer: [place]   Lot: [id number]   [place]: [address]-4

## 2022-01-03 NOTE — Progress Notes (Signed)
Sent message, via epic in basket, requesting orders in epic from surgeon.  

## 2022-01-13 NOTE — Patient Instructions (Addendum)
DUE TO COVID-19 ONLY ONE VISITOR IS ALLOWED TO COME WITH YOU AND STAY IN THE WAITING ROOM ONLY DURING PRE OP AND PROCEDURE DAY OF SURGERY IF YOU ARE GOING HOME AFTER SURGERY. IF YOU ARE SPENDING THE NIGHT 2 PEOPLE MAY VISIT WITH YOU IN YOUR PRIVATE ROOM AFTER SURGERY UNTIL VISITING  HOURS ARE OVER AT 8:00 PM AND 1  VISITOR  MAY  SPEND THE NIGHT.   YOU NEED TO HAVE A COVID 19 TEST ON__2/3_____ @_12 :30 pm______, THIS TEST MUST BE DONE BEFORE SURGERY,  COVID TESTING SITE  IS LOCATED AT [place], [place]. REMAIN IN YOUR CAR THIS IS A DRIVE UP TEST. AFTER YOUR COVID TEST PLEASE WEAR A MASK OUT IN PUBLIC AND SOCIAL DISTANCE AND [place] YOUR HANDS FREQUENTLY, ALSO ASK ALL YOUR CLOSE CONTACT PERSONS TO WEAR A MASK AND SOCIAL DISTANCE AND [place] THEIR HANDS FREQUENTLY ALSO.               Daniel [name]     Your procedure is scheduled on: 01/20/22   Report to [facility] Main  Entrance   Report to short stay at 5:15 AM     Call this number if you have problems the morning of surgery [phone]   No food after midnight.    You may have clear liquid until 4:30 AM.    At 4:30 AM drink pre surgery drink.   Nothing by mouth after 4:30 AM.    CLEAR LIQUID DIET   Foods Allowed                                                                     Foods Excluded  Coffee and tea, regular and decaf                             liquids that you cannot  Plain Jell-O any favor except red or purple                                           see through such as: Fruit ices (not with fruit pulp)                                     milk, soups, orange juice  Iced Popsicles                                    All solid food Carbonated beverages, regular and diet                                    Cranberry, grape and apple juices Sports drinks like Gatorade Lightly seasoned clear broth or consume(fat free) Sugar    BRUSH YOUR TEETH MORNING OF SURGERY AND RINSE YOUR MOUTH OUT, NO CHEWING  GUM CANDY OR MINTS.     Take these medicines the morning of surgery  with A SIP OF WATER: Amlodipine, Levothyroxine, Allopurinol                                You may not have any metal on your body including              piercings  Do not wear jewelry,  lotions, powders or  deodorant             Men may shave face and neck.   Do not bring valuables to the hospital. [place].  Contacts, dentures or bridgework may not be worn into surgery.  Leave suitcase in the car. After surgery it may be brought to your room.     Patients discharged the day of surgery will not be allowed to drive home.  IF YOU ARE HAVING SURGERY AND GOING HOME THE SAME DAY, YOU MUST HAVE AN ADULT TO DRIVE YOU HOME AND BE WITH YOU FOR 24 HOURS. YOU MAY GO HOME BY TAXI OR UBER OR ORTHERWISE, BUT AN ADULT MUST ACCOMPANY YOU HOME AND STAY WITH YOU FOR 24 HOURS.  Name and phone number of your driver:  Special Instructions: N/A              Please read over the following fact sheets you were given: _____________________________________________________________________             [facility] - Preparing for Surgery Before surgery, you can play an important role.  Because skin is not sterile, your skin needs to be as free of germs as possible.  You can reduce the number of germs on your skin by washing with CHG (chlorahexidine gluconate) soap before surgery.  CHG is an antiseptic cleaner which kills germs and bonds with the skin to continue killing germs even after washing. Please DO NOT use if you have an allergy to CHG or antibacterial soaps.  If your skin becomes reddened/irritated stop using the CHG and inform your nurse when you arrive at Short Stay. You may shave your face/neck. Please follow these instructions carefully:  1.  Shower with CHG Soap the night before surgery and the  morning of Surgery.  2.  If you choose to wash your hair, wash your hair first as usual  with your  normal  shampoo.  3.  After you shampoo, rinse your hair and body thoroughly to remove the  shampoo.                            4.  Use CHG as you would any other liquid soap.  You can apply chg directly  to the skin and wash                       Gently with a scrungie or clean washcloth.  5.  Apply the CHG Soap to your body ONLY FROM THE NECK DOWN.   Do not use on face/ open                           Wound or open sores. Avoid contact with eyes, ears mouth and genitals (private parts).  Wash face,  Genitals (private parts) with your normal soap.             6.  Wash thoroughly, paying special attention to the area where your surgery  will be performed.  7.  Thoroughly rinse your body with warm water from the neck down.  8.  DO NOT shower/wash with your normal soap after using and rinsing off  the CHG Soap.                9.  Pat yourself dry with a clean towel.            10.  Wear clean pajamas.            11.  Place clean sheets on your bed the night of your first shower and do not  sleep with pets. Day of Surgery : Do not apply any lotions/deodorants the morning of surgery.  Please wear clean clothes to the hospital/surgery center.  FAILURE TO FOLLOW THESE INSTRUCTIONS MAY RESULT IN THE CANCELLATION OF YOUR SURGERY PATIENT SIGNATURE_________________________________  NURSE SIGNATURE__________________________________  ________________________________________________________________________   Daniel [name]  An incentive spirometer is a tool that can help keep your lungs clear and active. This tool measures how well you are filling your lungs with each breath. Taking long deep breaths may help reverse or decrease the chance of developing breathing (pulmonary) problems (especially infection) following: A long period of time when you are unable to move or be active. BEFORE THE PROCEDURE  If the spirometer includes an indicator to show your best  effort, your nurse or respiratory therapist will set it to a desired goal. If possible, sit up straight or lean slightly forward. Try not to slouch. Hold the incentive spirometer in an upright position. INSTRUCTIONS FOR USE  Sit on the edge of your bed if possible, or sit up as far as you can in bed or on a chair. Hold the incentive spirometer in an upright position. Breathe out normally. Place the mouthpiece in your mouth and seal your lips tightly around it. Breathe in slowly and as deeply as possible, raising the piston or the ball toward the top of the column. Hold your breath for 3-5 seconds or for as long as possible. Allow the piston or ball to fall to the bottom of the column. Remove the mouthpiece from your mouth and breathe out normally. Rest for a few seconds and repeat Steps 1 through 7 at least 10 times every 1-2 hours when you are awake. Take your time and take a few normal breaths between deep breaths. The spirometer may include an indicator to show your best effort. Use the indicator as a goal to work toward during each repetition. After each set of 10 deep breaths, practice coughing to be sure your lungs are clear. If you have an incision (the cut made at the time of surgery), support your incision when coughing by placing a pillow or rolled up towels firmly against it. Once you are able to get out of bed, walk around indoors and cough well. You may stop using the incentive spirometer when instructed by your caregiver.  RISKS AND COMPLICATIONS Take your time so you do not get dizzy or light-headed. If you are in pain, you may need to take or ask for pain medication before doing incentive spirometry. It is harder to take a deep breath if you are having pain. AFTER USE Rest and breathe slowly and easily. It can be helpful to keep track of  a log of your progress. Your caregiver can provide you with a simple table to help with this. If you are using the spirometer at home, follow  these instructions: [place] IF:  You are having difficultly using the spirometer. You have trouble using the spirometer as often as instructed. Your pain medication is not giving enough relief while using the spirometer. You develop fever of 100.5 F (38.1 C) or higher. SEEK IMMEDIATE MEDICAL CARE IF:  You cough up bloody sputum that had not been present before. You develop fever of 102 F (38.9 C) or greater. You develop worsening pain at or near the incision site. MAKE SURE YOU:  Understand these instructions. Will watch your condition. Will get help right away if you are not doing well or get worse. Document Released: 04/13/2007 Document Revised: 02/23/2012 Document Reviewed: 06/14/2007 [facility] Patient Information 2014 [place], [place].   ________________________________________________________________________

## 2022-01-14 ENCOUNTER — Encounter (HOSPITAL_COMMUNITY): Payer: Self-pay

## 2022-01-14 ENCOUNTER — Other Ambulatory Visit: Payer: Self-pay

## 2022-01-14 ENCOUNTER — Encounter (HOSPITAL_COMMUNITY)
Admission: RE | Admit: 2022-01-14 | Discharge: 2022-01-14 | Disposition: A | Discharge status: Home / Self Care | Payer: Medicare Other | Source: Ambulatory Visit | Attending: Orthopedic Surgery | Admitting: Orthopedic Surgery

## 2022-01-14 VITALS — BP 157/85 | HR 72 | Temp 97.6°F | Resp 16 | Ht 66.5 in | Wt 153.0 lb

## 2022-01-14 DIAGNOSIS — I129 Hypertensive chronic kidney disease with stage 1 through stage 4 chronic kidney disease, or unspecified chronic kidney disease: Secondary | ICD-10-CM | POA: Insufficient documentation

## 2022-01-14 DIAGNOSIS — E039 Hypothyroidism, unspecified: Secondary | ICD-10-CM | POA: Insufficient documentation

## 2022-01-14 DIAGNOSIS — M1711 Unilateral primary osteoarthritis, right knee: Secondary | ICD-10-CM | POA: Diagnosis not present

## 2022-01-14 DIAGNOSIS — M109 Gout, unspecified: Secondary | ICD-10-CM | POA: Diagnosis not present

## 2022-01-14 DIAGNOSIS — Z01818 Encounter for other preprocedural examination: Secondary | ICD-10-CM | POA: Insufficient documentation

## 2022-01-14 DIAGNOSIS — R7303 Prediabetes: Secondary | ICD-10-CM | POA: Diagnosis not present

## 2022-01-14 DIAGNOSIS — N184 Chronic kidney disease, stage 4 (severe): Secondary | ICD-10-CM | POA: Diagnosis not present

## 2022-01-14 DIAGNOSIS — I10 Essential (primary) hypertension: Secondary | ICD-10-CM

## 2022-01-14 HISTORY — DX: Chronic kidney disease, stage 4 (severe): N18.4

## 2022-01-14 LAB — BASIC METABOLIC PANEL
Anion gap: 6 (ref 5–15)
BUN: 40 mg/dL — ABNORMAL HIGH (ref 8–23)
CO2: 24 mmol/L (ref 22–32)
Calcium: 9.2 mg/dL (ref 8.9–10.3)
Chloride: 110 mmol/L (ref 98–111)
Creatinine, Ser: 2.41 mg/dL — ABNORMAL HIGH (ref 0.61–1.24)
GFR, Estimated: 24 mL/min — ABNORMAL LOW (ref >60)
Glucose, Bld: 105 mg/dL — ABNORMAL HIGH (ref 70–99)
Potassium: 5 mmol/L (ref 3.5–5.1)
Sodium: 140 mmol/L (ref 135–145)

## 2022-01-14 LAB — CBC
HCT: 36.2 % — ABNORMAL LOW (ref 39.0–52.0)
Hemoglobin: 12.3 g/dL — ABNORMAL LOW (ref 13.0–17.0)
MCH: 35 pg — ABNORMAL HIGH (ref 26.0–34.0)
MCHC: 34 g/dL (ref 30.0–36.0)
MCV: 103.1 fL — ABNORMAL HIGH (ref 80.0–100.0)
Platelets: 249 10*3/uL (ref 150–400)
RBC: 3.51 MIL/uL — ABNORMAL LOW (ref 4.22–5.81)
RDW: 13.2 % (ref 11.5–15.5)
WBC: 9.3 10*3/uL (ref 4.0–10.5)
nRBC: 0 % (ref 0.0–0.2)

## 2022-01-14 LAB — HEMOGLOBIN A1C
Hgb A1c MFr Bld: 5.1 % (ref 4.8–5.6)
Mean Plasma Glucose: 99.67 mg/dL

## 2022-01-14 LAB — GLUCOSE, CAPILLARY: Glucose-Capillary: 103 mg/dL — ABNORMAL HIGH (ref 70–99)

## 2022-01-14 NOTE — Progress Notes (Signed)
COVID test- 01/16/22 at 12:30  PCP - Dr. Nelly Laurence Cardiologist - no  Chest x-ray - no EKG - 01/14/22-chart Stress Test - no ECHO - no Cardiac Cath - no Pacemaker/ICD device last checked:NA  Sleep Study - no CPAP -   Fasting Blood Sugar - pre diabetic doesn't check Checks Blood Sugar _____ times a day  Blood Thinner Instructions:NA Aspirin Instructions: Last Dose:  Anesthesia review: yes  Patient denies shortness of breath, fever, cough and chest pain at PAT appointment Pt uses a cane to ambulate and reports no SOB with activities. He still lives alone.  Patient verbalized understanding of instructions that were given to them at the PAT appointment. Patient was also instructed that they will need to review over the PAT instructions again at home before surgery. Pt's daughter was with him at his PAT visit. Instructions were given to him and a copy to his daughter.

## 2022-01-15 ENCOUNTER — Other Ambulatory Visit: Payer: Self-pay | Admitting: Orthopedic Surgery

## 2022-01-15 DIAGNOSIS — G8929 Other chronic pain: Secondary | ICD-10-CM

## 2022-01-16 ENCOUNTER — Other Ambulatory Visit: Payer: Self-pay

## 2022-01-16 ENCOUNTER — Encounter (HOSPITAL_COMMUNITY)
Admission: RE | Admit: 2022-01-16 | Discharge: 2022-01-16 | Disposition: A | Discharge status: Home / Self Care | Payer: Medicare Other | Source: Ambulatory Visit | Attending: Orthopedic Surgery | Admitting: Orthopedic Surgery

## 2022-01-16 DIAGNOSIS — Z20822 Contact with and (suspected) exposure to covid-19: Secondary | ICD-10-CM | POA: Insufficient documentation

## 2022-01-16 DIAGNOSIS — Z01812 Encounter for preprocedural laboratory examination: Secondary | ICD-10-CM | POA: Diagnosis present

## 2022-01-16 DIAGNOSIS — Z01818 Encounter for other preprocedural examination: Secondary | ICD-10-CM

## 2022-01-16 LAB — SURGICAL PCR SCREEN
MRSA, PCR: NEGATIVE
Staphylococcus aureus: NEGATIVE

## 2022-01-16 LAB — SARS CORONAVIRUS 2 (TAT 6-24 HRS): SARS Coronavirus 2: NEGATIVE

## 2022-01-17 ENCOUNTER — Encounter (HOSPITAL_COMMUNITY): Payer: Self-pay

## 2022-01-17 NOTE — Anesthesia Preprocedure Evaluation (Addendum)
Anesthesia Evaluation  Patient identified by MRN, date of birth, ID band Patient awake    Reviewed: Allergy & Precautions, NPO status , Patient's Chart, lab work & pertinent test results  Airway Mallampati: I  TM Distance: >3 FB Neck ROM: Full    Dental  (+) Teeth Intact, Dental Advisory Given   Pulmonary COPD, former smoker,    breath sounds clear to auscultation       Cardiovascular hypertension, Pt. on medications  Rhythm:Regular Rate:Normal     Neuro/Psych negative neurological ROS  negative psych ROS   GI/Hepatic   Endo/Other  Hypothyroidism   Renal/GU Renal disease     Musculoskeletal   Abdominal Normal abdominal exam  (+)   Peds  Hematology   Anesthesia Other Findings   Reproductive/Obstetrics                           Anesthesia Physical Anesthesia Plan  ASA: 3  Anesthesia Plan: Spinal   Post-op Pain Management: Regional block   Induction: Intravenous  PONV Risk Score and Plan: 2 and Ondansetron and Propofol infusion  Airway Management Planned: Natural Airway and Simple Face Mask  Additional Equipment: None  Intra-op Plan:   Post-operative Plan:   Informed Consent: I have reviewed the patients History and Physical, chart, labs and discussed the procedure including the risks, benefits and alternatives for the proposed anesthesia with the patient or authorized representative who has indicated his/her understanding and acceptance.       Plan Discussed with: CRNA  Anesthesia Plan Comments: (See APP note by Durel Salts, FNP   Lab Results      Component                Value               Date                      WBC                      9.3                 01/14/2022                HGB                      12.3 (L)            01/14/2022                HCT                      36.2 (L)            01/14/2022                MCV                      103.1 (H)            01/14/2022                PLT                      249                 01/14/2022           )  Anesthesia Quick Evaluation

## 2022-01-17 NOTE — Progress Notes (Signed)
Anesthesia Chart Review:   Case: [id number] Date/Time: 01/20/22 0745   Procedure: TOTAL KNEE ARTHROPLASTY (Right: Knee)   Anesthesia type: Spinal   Pre-op diagnosis: Osteoarthritis of right knee M17.11   Location: [place] [facility] / [facility] OR[facility]   Surgeons: Vickey Huger, MD       DISCUSSION: Pt is 88 years old with hx HTN, prediabetes, CKD stage 4 (baseline Cr ~2)  VS: BP (!) 157/85    Pulse 72    Temp 36.4 C (Oral)    Resp 16    Ht 5' 6.5" (1.689 m)    Wt 69.4 kg    SpO2 99%    BMI 24.32 kg/m   PROVIDERS: - PCP is Nelly Laurence, NP who cleared pt for surgery - Nephrologist is Lawson Radar, MD   LABS: Labs reviewed: Acceptable for surgery. - Cr 2.41. Baseline Cr ~2  (all labs ordered are listed, but only abnormal results are displayed)  Labs Reviewed  BASIC METABOLIC PANEL - Abnormal; Notable for the following components:      Result Value   Glucose, Bld 105 (*)    BUN 40 (*)    Creatinine, Ser 2.41 (*)    GFR, Estimated 24 (*)    All other components within normal limits  CBC - Abnormal; Notable for the following components:   RBC 3.51 (*)    Hemoglobin 12.3 (*)    HCT 36.2 (*)    MCV 103.1 (*)    MCH 35.0 (*)    All other components within normal limits  GLUCOSE, CAPILLARY - Abnormal; Notable for the following components:   Glucose-Capillary 103 (*)    All other components within normal limits  HEMOGLOBIN A1C    EKG 01/14/22: Sinus rhythm with Premature atrial complexes   CV: N/A    Past Medical History:  Diagnosis Date   Blood transfusion    CKD (chronic kidney disease) stage 4, GFR 15-29 ml/min (HCC)    Gout    Hearing loss    Hypertension    Hypertriglyceridemia    Hypothyroid    Prediabetes    Prostate cancer ([place])    Pulmonary nodule    Tinnitus     Past Surgical History:  Procedure Laterality Date   HERNIA REPAIR Right 2010   left inguinal hernia repair  06/29/2008   with mesh sail   PROSTATECTOMY  1998   prostate cancer   right ankle  1980    fracture   right knee  1980   torn meniscus    MEDICATIONS:  allopurinol (ZYLOPRIM) 100 MG tablet   ALPRAZolam (XANAX) 0.5 MG tablet   amLODipine (NORVASC) 5 MG tablet   cholecalciferol (VITAMIN D) 1000 units tablet   cyanocobalamin (,VITAMIN B-12,) 1000 MCG/ML injection   cyanocobalamin 1000 MCG tablet   ixazomib citrate (NINLARO) 3 MG capsule   lenalidomide (REVLIMID) 10 MG capsule   levothyroxine (SYNTHROID, LEVOTHROID) 100 MCG tablet   magnesium oxide (MAG-OX) 400 MG tablet   No current facility-administered medications for this encounter.   If no changes, I anticipate pt can proceed with surgery as scheduled.   Willeen Cass, PhD, FNP-BC [facility] Short Stay Surgical [facility]/Anesthesiology Phone: [phone] 01/17/2022 8:29 AM

## 2022-01-20 ENCOUNTER — Encounter (HOSPITAL_COMMUNITY): Payer: Self-pay | Admitting: Orthopedic Surgery

## 2022-01-20 ENCOUNTER — Ambulatory Visit (HOSPITAL_COMMUNITY): Payer: Medicare Other | Admitting: Anesthesiology

## 2022-01-20 ENCOUNTER — Other Ambulatory Visit: Payer: Self-pay

## 2022-01-20 ENCOUNTER — Observation Stay (HOSPITAL_COMMUNITY)
Admission: RE | Admit: 2022-01-20 | Discharge: 2022-01-21 | Disposition: A | Discharge status: Home / Self Care | Payer: Medicare Other | Attending: Orthopedic Surgery | Admitting: Orthopedic Surgery

## 2022-01-20 ENCOUNTER — Ambulatory Visit (HOSPITAL_COMMUNITY): Payer: Medicare Other | Admitting: Emergency Medicine

## 2022-01-20 ENCOUNTER — Encounter (HOSPITAL_COMMUNITY)
Admission: RE | Disposition: A | Discharge status: Home / Self Care | Payer: Self-pay | Source: Home / Self Care | Attending: Orthopedic Surgery

## 2022-01-20 DIAGNOSIS — Z8546 Personal history of malignant neoplasm of prostate: Secondary | ICD-10-CM | POA: Insufficient documentation

## 2022-01-20 DIAGNOSIS — Z96659 Presence of unspecified artificial knee joint: Secondary | ICD-10-CM

## 2022-01-20 DIAGNOSIS — Z79899 Other long term (current) drug therapy: Secondary | ICD-10-CM | POA: Insufficient documentation

## 2022-01-20 DIAGNOSIS — N184 Chronic kidney disease, stage 4 (severe): Secondary | ICD-10-CM | POA: Diagnosis not present

## 2022-01-20 DIAGNOSIS — J449 Chronic obstructive pulmonary disease, unspecified: Secondary | ICD-10-CM | POA: Diagnosis not present

## 2022-01-20 DIAGNOSIS — G8929 Other chronic pain: Secondary | ICD-10-CM

## 2022-01-20 DIAGNOSIS — E039 Hypothyroidism, unspecified: Secondary | ICD-10-CM | POA: Insufficient documentation

## 2022-01-20 DIAGNOSIS — Z87891 Personal history of nicotine dependence: Secondary | ICD-10-CM | POA: Insufficient documentation

## 2022-01-20 DIAGNOSIS — M1711 Unilateral primary osteoarthritis, right knee: Principal | ICD-10-CM | POA: Insufficient documentation

## 2022-01-20 DIAGNOSIS — I129 Hypertensive chronic kidney disease with stage 1 through stage 4 chronic kidney disease, or unspecified chronic kidney disease: Secondary | ICD-10-CM | POA: Insufficient documentation

## 2022-01-20 HISTORY — PX: TOTAL KNEE ARTHROPLASTY: SHX125

## 2022-01-20 LAB — CBC WITH DIFFERENTIAL/PLATELET
Abs Immature Granulocytes: 0.03 10*3/uL (ref 0.00–0.07)
Basophils Absolute: 0 10*3/uL (ref 0.0–0.1)
Basophils Relative: 0 %
Eosinophils Absolute: 0.4 10*3/uL (ref 0.0–0.5)
Eosinophils Relative: 4 %
HCT: 35.7 % — ABNORMAL LOW (ref 39.0–52.0)
Hemoglobin: 12.3 g/dL — ABNORMAL LOW (ref 13.0–17.0)
Immature Granulocytes: 0 %
Lymphocytes Relative: 19 %
Lymphs Abs: 1.7 10*3/uL (ref 0.7–4.0)
MCH: 35 pg — ABNORMAL HIGH (ref 26.0–34.0)
MCHC: 34.5 g/dL (ref 30.0–36.0)
MCV: 101.7 fL — ABNORMAL HIGH (ref 80.0–100.0)
Monocytes Absolute: 0.7 10*3/uL (ref 0.1–1.0)
Monocytes Relative: 7 %
Neutro Abs: 6.3 10*3/uL (ref 1.7–7.7)
Neutrophils Relative %: 70 %
Platelets: 277 10*3/uL (ref 150–400)
RBC: 3.51 MIL/uL — ABNORMAL LOW (ref 4.22–5.81)
RDW: 13 % (ref 11.5–15.5)
WBC: 9.2 10*3/uL (ref 4.0–10.5)
nRBC: 0 % (ref 0.0–0.2)

## 2022-01-20 LAB — COMPREHENSIVE METABOLIC PANEL
ALT: 11 U/L (ref 0–44)
AST: 21 U/L (ref 15–41)
Albumin: 3.6 g/dL (ref 3.5–5.0)
Alkaline Phosphatase: 70 U/L (ref 38–126)
Anion gap: 10 (ref 5–15)
BUN: 35 mg/dL — ABNORMAL HIGH (ref 8–23)
CO2: 23 mmol/L (ref 22–32)
Calcium: 9 mg/dL (ref 8.9–10.3)
Chloride: 105 mmol/L (ref 98–111)
Creatinine, Ser: 2.27 mg/dL — ABNORMAL HIGH (ref 0.61–1.24)
GFR, Estimated: 26 mL/min — ABNORMAL LOW (ref >60)
Glucose, Bld: 116 mg/dL — ABNORMAL HIGH (ref 70–99)
Potassium: 3.8 mmol/L (ref 3.5–5.1)
Sodium: 138 mmol/L (ref 135–145)
Total Bilirubin: 0.7 mg/dL (ref 0.3–1.2)
Total Protein: 6.2 g/dL — ABNORMAL LOW (ref 6.5–8.1)

## 2022-01-20 SURGERY — ARTHROPLASTY, KNEE, TOTAL
Anesthesia: Spinal | Site: Knee | Laterality: Right

## 2022-01-20 MED ORDER — FENTANYL CITRATE PF 50 MCG/ML IJ SOSY
50.0000 ug | PREFILLED_SYRINGE | Freq: Once | INTRAMUSCULAR | Status: DC
  Filled 2022-01-20: qty 2

## 2022-01-20 MED ORDER — BUPIVACAINE LIPOSOME 1.3 % IJ SUSP
INTRAMUSCULAR | Status: AC
  Filled 2022-01-20: qty 20

## 2022-01-20 MED ORDER — ACETAMINOPHEN 160 MG/5ML PO SOLN: 325.0000 mg | Freq: Once | ORAL | Status: DC | PRN

## 2022-01-20 MED ORDER — GABAPENTIN 300 MG PO CAPS
300.0000 mg | ORAL_CAPSULE | Freq: Once | ORAL | Status: AC
  Administered 2022-01-20: 300 mg via ORAL
  Filled 2022-01-20: qty 1

## 2022-01-20 MED ORDER — DEXAMETHASONE SODIUM PHOSPHATE 10 MG/ML IJ SOLN
8.0000 mg | Freq: Once | INTRAMUSCULAR | Status: AC
  Administered 2022-01-20: 8 mg via INTRAVENOUS

## 2022-01-20 MED ORDER — BUPIVACAINE-EPINEPHRINE (PF) 0.25% -1:200000 IJ SOLN
INTRAMUSCULAR | Status: AC
  Filled 2022-01-20: qty 30

## 2022-01-20 MED ORDER — ACETAMINOPHEN 10 MG/ML IV SOLN: 1000.0000 mg | Freq: Once | INTRAVENOUS | Status: DC | PRN

## 2022-01-20 MED ORDER — DEXAMETHASONE SODIUM PHOSPHATE 10 MG/ML IJ SOLN
10.0000 mg | Freq: Once | INTRAMUSCULAR | Status: AC
  Administered 2022-01-21: 10 mg via INTRAVENOUS
  Filled 2022-01-20: qty 1

## 2022-01-20 MED ORDER — BUPIVACAINE LIPOSOME 1.3 % IJ SUSP: 20.0000 mL | Freq: Once | INTRAMUSCULAR | Status: DC

## 2022-01-20 MED ORDER — PHENOL 1.4 % MT LIQD: 1.0000 | OROMUCOSAL | Status: DC | PRN

## 2022-01-20 MED ORDER — MEPERIDINE HCL 50 MG/ML IJ SOLN: 6.2500 mg | INTRAMUSCULAR | Status: DC | PRN

## 2022-01-20 MED ORDER — BUPIVACAINE IN DEXTROSE 0.75-8.25 % IT SOLN
INTRATHECAL | Status: DC | PRN
  Administered 2022-01-20: 1.6 mL via INTRATHECAL

## 2022-01-20 MED ORDER — CHLORHEXIDINE GLUCONATE 0.12 % MT SOLN
15.0000 mL | Freq: Once | OROMUCOSAL | Status: AC
  Administered 2022-01-20: 15 mL via OROMUCOSAL

## 2022-01-20 MED ORDER — METOCLOPRAMIDE HCL 5 MG/ML IJ SOLN: 5.0000 mg | Freq: Three times a day (TID) | INTRAMUSCULAR | Status: DC | PRN

## 2022-01-20 MED ORDER — PHENYLEPHRINE HCL-NACL 20-0.9 MG/250ML-% IV SOLN
INTRAVENOUS | Status: DC | PRN
Start: 2022-01-20 — End: 2022-01-20
  Administered 2022-01-20: 50 ug/min via INTRAVENOUS

## 2022-01-20 MED ORDER — CEFAZOLIN SODIUM-DEXTROSE 2-4 GM/100ML-% IV SOLN
2.0000 g | INTRAVENOUS | Status: AC
  Administered 2022-01-20: 2 g via INTRAVENOUS
  Filled 2022-01-20: qty 100

## 2022-01-20 MED ORDER — ONDANSETRON HCL 4 MG PO TABS: 4.0000 mg | ORAL_TABLET | Freq: Four times a day (QID) | ORAL | Status: DC | PRN

## 2022-01-20 MED ORDER — TRAMADOL HCL 50 MG PO TABS: 50.0000 mg | ORAL_TABLET | Freq: Four times a day (QID) | ORAL | Status: DC

## 2022-01-20 MED ORDER — ONDANSETRON HCL 4 MG/2ML IJ SOLN
INTRAMUSCULAR | Status: DC | PRN
  Administered 2022-01-20: 4 mg via INTRAVENOUS

## 2022-01-20 MED ORDER — DOCUSATE SODIUM 100 MG PO CAPS
100.0000 mg | ORAL_CAPSULE | Freq: Two times a day (BID) | ORAL | Status: DC
  Administered 2022-01-20 – 2022-01-21 (×2): 100 mg via ORAL
  Filled 2022-01-20 (×2): qty 1

## 2022-01-20 MED ORDER — METOCLOPRAMIDE HCL 5 MG PO TABS: 5.0000 mg | ORAL_TABLET | Freq: Three times a day (TID) | ORAL | Status: DC | PRN

## 2022-01-20 MED ORDER — HYDROMORPHONE HCL 1 MG/ML IJ SOLN: 0.2500 mg | INTRAMUSCULAR | Status: DC | PRN

## 2022-01-20 MED ORDER — PROPOFOL 1000 MG/100ML IV EMUL
INTRAVENOUS | Status: AC
  Filled 2022-01-20: qty 100

## 2022-01-20 MED ORDER — LACTATED RINGERS IV SOLN: INTRAVENOUS | Status: DC

## 2022-01-20 MED ORDER — APIXABAN 2.5 MG PO TABS
2.5000 mg | ORAL_TABLET | Freq: Two times a day (BID) | ORAL | Status: DC
  Administered 2022-01-21: 2.5 mg via ORAL
  Filled 2022-01-20: qty 1

## 2022-01-20 MED ORDER — AMISULPRIDE (ANTIEMETIC) 5 MG/2ML IV SOLN: 10.0000 mg | Freq: Once | INTRAVENOUS | Status: DC | PRN

## 2022-01-20 MED ORDER — FLEET ENEMA 7-19 GM/118ML RE ENEM: 1.0000 | ENEMA | Freq: Once | RECTAL | Status: DC | PRN

## 2022-01-20 MED ORDER — SODIUM CHLORIDE 0.9% FLUSH
INTRAVENOUS | Status: DC | PRN
  Administered 2022-01-20: 20 mL

## 2022-01-20 MED ORDER — PROPOFOL 500 MG/50ML IV EMUL
INTRAVENOUS | Status: DC | PRN
  Administered 2022-01-20: 30 ug/kg/min via INTRAVENOUS

## 2022-01-20 MED ORDER — MENTHOL 3 MG MT LOZG: 1.0000 | LOZENGE | OROMUCOSAL | Status: DC | PRN

## 2022-01-20 MED ORDER — BUPIVACAINE LIPOSOME 1.3 % IJ SUSP
INTRAMUSCULAR | Status: DC | PRN
  Administered 2022-01-20: 20 mL

## 2022-01-20 MED ORDER — TRANEXAMIC ACID-NACL 1000-0.7 MG/100ML-% IV SOLN
1000.0000 mg | Freq: Once | INTRAVENOUS | Status: AC
  Administered 2022-01-20: 1000 mg via INTRAVENOUS
  Filled 2022-01-20: qty 100

## 2022-01-20 MED ORDER — ALPRAZOLAM 0.5 MG PO TABS: 0.5000 mg | ORAL_TABLET | Freq: Three times a day (TID) | ORAL | Status: DC | PRN

## 2022-01-20 MED ORDER — OXYCODONE HCL 5 MG PO TABS
5.0000 mg | ORAL_TABLET | ORAL | Status: DC | PRN
  Administered 2022-01-21 (×2): 5 mg via ORAL
  Filled 2022-01-20 (×2): qty 1

## 2022-01-20 MED ORDER — METHOCARBAMOL 1000 MG/10ML IJ SOLN: 500.0000 mg | Freq: Four times a day (QID) | INTRAVENOUS | Status: DC | PRN

## 2022-01-20 MED ORDER — DEXAMETHASONE SODIUM PHOSPHATE 10 MG/ML IJ SOLN
INTRAMUSCULAR | Status: AC
  Filled 2022-01-20: qty 1

## 2022-01-20 MED ORDER — SENNOSIDES-DOCUSATE SODIUM 8.6-50 MG PO TABS: 1.0000 | ORAL_TABLET | Freq: Every evening | ORAL | Status: DC | PRN

## 2022-01-20 MED ORDER — BUPIVACAINE-EPINEPHRINE 0.25% -1:200000 IJ SOLN
INTRAMUSCULAR | Status: DC | PRN
  Administered 2022-01-20: 30 mL

## 2022-01-20 MED ORDER — ONDANSETRON HCL 4 MG/2ML IJ SOLN
INTRAMUSCULAR | Status: AC
  Filled 2022-01-20: qty 2

## 2022-01-20 MED ORDER — PANTOPRAZOLE SODIUM 40 MG PO TBEC
40.0000 mg | DELAYED_RELEASE_TABLET | Freq: Every day | ORAL | Status: DC
  Administered 2022-01-20 – 2022-01-21 (×2): 40 mg via ORAL
  Filled 2022-01-20 (×2): qty 1

## 2022-01-20 MED ORDER — ALLOPURINOL 100 MG PO TABS
100.0000 mg | ORAL_TABLET | ORAL | Status: DC
  Administered 2022-01-20: 100 mg via ORAL
  Filled 2022-01-20: qty 1

## 2022-01-20 MED ORDER — HYDROMORPHONE HCL 1 MG/ML IJ SOLN: 0.5000 mg | INTRAMUSCULAR | Status: DC | PRN

## 2022-01-20 MED ORDER — PHENYLEPHRINE HCL-NACL 20-0.9 MG/250ML-% IV SOLN
INTRAVENOUS | Status: AC
  Filled 2022-01-20: qty 250

## 2022-01-20 MED ORDER — WATER FOR IRRIGATION, STERILE IR SOLN
Status: DC | PRN
  Administered 2022-01-20: 2000 mL

## 2022-01-20 MED ORDER — FENTANYL CITRATE (PF) 100 MCG/2ML IJ SOLN
INTRAMUSCULAR | Status: AC
  Filled 2022-01-20: qty 2

## 2022-01-20 MED ORDER — TRANEXAMIC ACID-NACL 1000-0.7 MG/100ML-% IV SOLN
1000.0000 mg | INTRAVENOUS | Status: AC
  Administered 2022-01-20: 1000 mg via INTRAVENOUS
  Filled 2022-01-20: qty 100

## 2022-01-20 MED ORDER — LEVOTHYROXINE SODIUM 100 MCG PO TABS
100.0000 ug | ORAL_TABLET | Freq: Every day | ORAL | Status: DC
  Administered 2022-01-21: 100 ug via ORAL
  Filled 2022-01-20: qty 1

## 2022-01-20 MED ORDER — MAGNESIUM OXIDE -MG SUPPLEMENT 400 (240 MG) MG PO TABS
400.0000 mg | ORAL_TABLET | Freq: Every day | ORAL | Status: DC
Start: 2022-01-20 — End: 2022-01-21
  Administered 2022-01-20 – 2022-01-21 (×2): 400 mg via ORAL
  Filled 2022-01-20 (×2): qty 1

## 2022-01-20 MED ORDER — SODIUM CHLORIDE (PF) 0.9 % IJ SOLN
INTRAMUSCULAR | Status: AC
  Filled 2022-01-20: qty 20

## 2022-01-20 MED ORDER — METHOCARBAMOL 500 MG PO TABS: 500.0000 mg | ORAL_TABLET | Freq: Four times a day (QID) | ORAL | Status: DC | PRN

## 2022-01-20 MED ORDER — ACETAMINOPHEN 500 MG PO TABS
1000.0000 mg | ORAL_TABLET | Freq: Once | ORAL | Status: AC
  Administered 2022-01-20: 1000 mg via ORAL
  Filled 2022-01-20: qty 2

## 2022-01-20 MED ORDER — ACETAMINOPHEN 500 MG PO TABS
1000.0000 mg | ORAL_TABLET | Freq: Four times a day (QID) | ORAL | Status: AC
  Administered 2022-01-20 (×3): 1000 mg via ORAL
  Filled 2022-01-20 (×4): qty 2

## 2022-01-20 MED ORDER — ZOLPIDEM TARTRATE 5 MG PO TABS: 5.0000 mg | ORAL_TABLET | Freq: Every evening | ORAL | Status: DC | PRN

## 2022-01-20 MED ORDER — PROMETHAZINE HCL 25 MG/ML IJ SOLN: 6.2500 mg | INTRAMUSCULAR | Status: DC | PRN

## 2022-01-20 MED ORDER — SODIUM CHLORIDE 0.9 % IV SOLN: INTRAVENOUS | Status: DC

## 2022-01-20 MED ORDER — DIPHENHYDRAMINE HCL 12.5 MG/5ML PO ELIX: 12.5000 mg | ORAL_SOLUTION | ORAL | Status: DC | PRN

## 2022-01-20 MED ORDER — AMLODIPINE BESYLATE 5 MG PO TABS
5.0000 mg | ORAL_TABLET | Freq: Every day | ORAL | Status: DC
Start: 2022-01-20 — End: 2022-01-21
  Administered 2022-01-20 – 2022-01-21 (×2): 5 mg via ORAL
  Filled 2022-01-20 (×2): qty 1

## 2022-01-20 MED ORDER — ROPIVACAINE HCL 5 MG/ML IJ SOLN
INTRAMUSCULAR | Status: DC | PRN
Start: 2022-01-20 — End: 2022-01-20
  Administered 2022-01-20: 30 mL via PERINEURAL

## 2022-01-20 MED ORDER — SODIUM CHLORIDE 0.9 % IR SOLN
Status: DC | PRN
  Administered 2022-01-20: 1000 mL

## 2022-01-20 MED ORDER — TRAMADOL HCL 50 MG PO TABS
50.0000 mg | ORAL_TABLET | Freq: Two times a day (BID) | ORAL | Status: DC
  Administered 2022-01-20 – 2022-01-21 (×3): 50 mg via ORAL
  Filled 2022-01-20 (×3): qty 1

## 2022-01-20 MED ORDER — ACETAMINOPHEN 325 MG PO TABS: 325.0000 mg | ORAL_TABLET | Freq: Once | ORAL | Status: DC | PRN

## 2022-01-20 MED ORDER — ONDANSETRON HCL 4 MG/2ML IJ SOLN: 4.0000 mg | Freq: Four times a day (QID) | INTRAMUSCULAR | Status: DC | PRN

## 2022-01-20 MED ORDER — ORAL CARE MOUTH RINSE: 15.0000 mL | Freq: Once | OROMUCOSAL | Status: AC

## 2022-01-20 MED ORDER — FENTANYL CITRATE (PF) 100 MCG/2ML IJ SOLN
INTRAMUSCULAR | Status: DC | PRN
  Administered 2022-01-20: 25 ug via INTRAVENOUS

## 2022-01-20 MED ORDER — FERROUS SULFATE 325 (65 FE) MG PO TABS
325.0000 mg | ORAL_TABLET | Freq: Three times a day (TID) | ORAL | Status: DC
  Administered 2022-01-20 – 2022-01-21 (×4): 325 mg via ORAL
  Filled 2022-01-20 (×4): qty 1

## 2022-01-20 MED ORDER — POVIDONE-IODINE 10 % EX SWAB
2.0000 "application " | Freq: Once | CUTANEOUS | Status: AC
  Administered 2022-01-20: 2 via TOPICAL

## 2022-01-20 MED ORDER — ALUM & MAG HYDROXIDE-SIMETH 200-200-20 MG/5ML PO SUSP: 30.0000 mL | ORAL | Status: DC | PRN

## 2022-01-20 MED ORDER — BISACODYL 5 MG PO TBEC: 5.0000 mg | DELAYED_RELEASE_TABLET | Freq: Every day | ORAL | Status: DC | PRN

## 2022-01-20 SURGICAL SUPPLY — 56 items
ARTISURF 10M PLY R 6-9EF KNEE (Knees) ×1 IMPLANT
BAG COUNTER SPONGE SURGICOUNT (BAG) ×1 IMPLANT
BAG ZIPLOCK 12X15 (MISCELLANEOUS) ×2 IMPLANT
BLADE SAGITTAL 13X1.27X60 (BLADE) ×2 IMPLANT
BLADE SAW SGTL 18X1.27X75 (BLADE) ×2 IMPLANT
BLADE SURG 15 STRL LF DISP TIS (BLADE) ×1 IMPLANT
BLADE SURG 15 STRL SS (BLADE) ×2
BLADE SURG SZ10 CARB STEEL (BLADE) ×4 IMPLANT
BNDG ELASTIC 6X5.8 VLCR STR LF (GAUZE/BANDAGES/DRESSINGS) ×2 IMPLANT
BOWL SMART MIX CTS (DISPOSABLE) ×2 IMPLANT
CEMENT BONE SIMPLEX SPEEDSET (Cement) ×4 IMPLANT
COVER SURGICAL LIGHT HANDLE (MISCELLANEOUS) ×2 IMPLANT
CUFF TOURN SGL QUICK 34 (TOURNIQUET CUFF) ×2
CUFF TRNQT CYL 34X4.125X (TOURNIQUET CUFF) ×1 IMPLANT
DRAPE INCISE IOBAN 66X45 STRL (DRAPES) ×6 IMPLANT
DRAPE U-SHAPE 47X51 STRL (DRAPES) ×2 IMPLANT
DRSG AQUACEL AG ADV 3.5X10 (GAUZE/BANDAGES/DRESSINGS) ×2 IMPLANT
DURAPREP 26ML APPLICATOR (WOUND CARE) ×4 IMPLANT
ELECT REM PT RETURN 15FT ADLT (MISCELLANEOUS) ×2 IMPLANT
FEMUR  CMT CCR STD SZ9 R KNEE (Knees) ×2 IMPLANT
FEMUR CMT CCR STD SZ9 R KNEE (Knees) ×1 IMPLANT
FEMUR CMTD CCR STD SZ9 R KNEE (Knees) IMPLANT
GAUZE 4X4 16PLY ~~LOC~~+RFID DBL (SPONGE) IMPLANT
GLOVE SURG ENC TEXT LTX SZ7.5 (GLOVE) ×2 IMPLANT
GLOVE SURG ENC TEXT LTX SZ8 (GLOVE) ×6 IMPLANT
GLOVE SURG UNDER POLY LF SZ7.5 (GLOVE) IMPLANT
GLOVE SURG UNDER POLY LF SZ8.5 (GLOVE) ×4 IMPLANT
GOWN STRL REUS W/ TWL XL LVL3 (GOWN DISPOSABLE) ×2 IMPLANT
GOWN STRL REUS W/TWL XL LVL3 (GOWN DISPOSABLE) ×4
HANDPIECE INTERPULSE COAX TIP (DISPOSABLE) ×2
HOLDER FOLEY CATH W/STRAP (MISCELLANEOUS) ×1 IMPLANT
HOOD PEEL AWAY FLYTE STAYCOOL (MISCELLANEOUS) ×6 IMPLANT
KIT TURNOVER KIT A (KITS) ×1 IMPLANT
MANIFOLD NEPTUNE II (INSTRUMENTS) ×2 IMPLANT
NDL HYPO 21X1.5 SAFETY (NEEDLE) ×1 IMPLANT
NEEDLE HYPO 21X1.5 SAFETY (NEEDLE) ×2 IMPLANT
NS IRRIG 1000ML POUR BTL (IV SOLUTION) ×2 IMPLANT
PACK TOTAL KNEE CUSTOM (KITS) ×2 IMPLANT
PROTECTOR NERVE ULNAR (MISCELLANEOUS) ×2 IMPLANT
SET HNDPC FAN SPRY TIP SCT (DISPOSABLE) ×1 IMPLANT
SLEEVE SURGEON STRL (DRAPES) ×1 IMPLANT
SPIKE FLUID TRANSFER (MISCELLANEOUS) ×4 IMPLANT
STEM POLY PAT PLY 35M KNEE (Knees) ×1 IMPLANT
STEM TIBIA 5 DEG SZ F R KNEE (Knees) IMPLANT
STRIP CLOSURE SKIN 1/2X4 (GAUZE/BANDAGES/DRESSINGS) ×2 IMPLANT
SUT BONE WAX W31G (SUTURE) ×2 IMPLANT
SUT MNCRL AB 3-0 PS2 18 (SUTURE) ×2 IMPLANT
SUT STRATAFIX 0 PDS 27 VIOLET (SUTURE) ×2
SUT STRATAFIX PDS+ 0 24IN (SUTURE) ×2 IMPLANT
SUT VIC AB 1 CT1 36 (SUTURE) ×2 IMPLANT
SUTURE STRATFX 0 PDS 27 VIOLET (SUTURE) ×1 IMPLANT
SYR 30ML LL (SYRINGE) ×4 IMPLANT
TIBIA STEM 5 DEG SZ F R KNEE (Knees) ×2 IMPLANT
TRAY FOLEY MTR SLVR 16FR STAT (SET/KITS/TRAYS/PACK) ×2 IMPLANT
WATER STERILE IRR 1000ML POUR (IV SOLUTION) ×4 IMPLANT
WRAP KNEE MAXI GEL POST OP (GAUZE/BANDAGES/DRESSINGS) ×2 IMPLANT

## 2022-01-20 NOTE — Op Note (Signed)
TOTAL KNEE REPLACEMENT OPERATIVE NOTE:  01/20/2022  11:11 AM  PATIENT:  Daniel [name]  88 y.o. male  PRE-OPERATIVE DIAGNOSIS:  Osteoarthritis of right knee M17.11  POST-OPERATIVE DIAGNOSIS:  * No post-op diagnosis entered *  PROCEDURE:  Procedure(s): TOTAL KNEE ARTHROPLASTY  SURGEON:  Surgeon(s): Vickey Huger, MD  PHYSICIAN ASSISTANT: Nehemiah Massed, PA-C  ANESTHESIA:   spinal  SPECIMEN: None  COUNTS:  Correct  TOURNIQUET:   Total Tourniquet Time Documented: Thigh (Right) - 20 minutes Thigh (Right) - 26 minutes Total: Thigh (Right) - 46 minutes   DICTATION:  Indication for procedure:    The patient is a 88 y.o. male who has failed conservative treatment for Osteoarthritis of right knee M17.11.  Informed consent was obtained prior to anesthesia. The risks versus benefits of the operation were explain and in a way the patient can, and did, understand.    Description of procedure:     The patient was taken to the operating room and placed under anesthesia.  The patient was positioned in the usual fashion taking care that all body parts were adequately padded and/or protected.  A tourniquet was applied and the leg prepped and draped in the usual sterile fashion.  The extremity was exsanguinated with the esmarch and tourniquet inflated to 300 mmHg.  Pre-operative range of motion was normal.    A midline incision approximately 6-7 inches long was made with a #10 blade.  A new blade was used to make a parapatellar arthrotomy going 2-3 cm into the quadriceps tendon, over the patella, and alongside the medial aspect of the patellar tendon.  A synovectomy was then performed with the #10 blade and forceps. I then elevated the deep MCL off the medial tibial metaphysis subperiosteally around to the semimembranosus attachment.    I everted the patella and used calipers to measure patellar thickness.  I used the reamer to ream down to appropriate thickness to recreate the native  thickness.  I then removed excess bone with the rongeur and sagittal saw.  I used the appropriately sized template and drilled the three lug holes.  I then put the trial in place and measured the thickness with the calipers to ensure recreation of the native thickness.  The trial was then removed and the patella subluxed and the knee brought into flexion.  A homan retractor was place to retract and protect the patella and lateral structures.  A Z-retractor was place medially to protect the medial structures.  The extra-medullary alignment system was used to make cut the tibial articular surface perpendicular to the anamotic axis of the tibia and in 3 degrees of posterior slope.  The cut surface and alignment jig was removed.  I then used the intramedullary alignment guide to make a valgus cut on the distal femur.  I then marked out the epicondylar axis on the distal femur.  T  I then used the anterior referencing sizer and measured the femur to be a size 9.  The 4-In-1 cutting block was screwed into place in external rotation matching the posterior condylar angle, making our cuts perpendicular to the epicondylar axis.  Anterior, posterior and chamfer cuts were made with the sagittal saw.  The cutting block and cut pieces were removed.  A lamina spreader was placed in 90 degrees of flexion.  The ACL, PCL, menisci, and posterior condylar osteophytes were removed.  A 10 mm spacer blocked was found to offer good flexion and extension gap balance after minimal in degree releasing.  The scoop retractor was then placed and the femoral finishing block was pinned in place.  The small sagittal saw was used as well as the lug drill to finish the femur.  The block and cut surfaces were removed and the medullary canal hole filled with autograft bone from the cut pieces.  The tibia was delivered forward in deep flexion and external rotation.  A size F tray was selected and pinned into place centered on the medial 1/3 of  the tibial tubercle.  The reamer and keel was used to prepare the tibia through the tray.    I then trialed with the size 9 femur, size F tibia, a 10 mm insert and the 35 patella.  I had excellent flexion/extension gap balance, excellent patella tracking.  Flexion was full and beyond 120 degrees; extension was zero.  These components were chosen and the staff opened them to me on the back table while the knee was lavaged copiously and the cement mixed.  The soft tissue was infiltrated with 60cc of exparel 1.3% through a 21 gauge needle.  I cemented in the components and removed all excess cement.  The polyethylene tibial component was snapped into place and the knee placed in extension while cement was hardening.  The capsule was infilltrated with a 60cc exparel/marcaine/saline mixture.   Once the cement was hard, the tourniquet was let down.  Hemostasis was obtained.  The arthrotomy was closed using a #1 stratofix running suture.  The deep soft tissues were closed with #0 vicryls and the subcuticular layer closed with #2-0 vicryl.  The skin was reapproximated and closed with 3.0 Monocryl.  The wound was covered with steristrips, aquacel dressing, and a TED stocking.   The patient was then awakened, extubated, and taken to the recovery room in stable condition.  BLOOD LOSS:  836OQ COMPLICATIONS:  None.  PLAN OF CARE: Admit for overnight observation  PATIENT DISPOSITION:  PACU - hemodynamically stable.     Please fax a copy of this op note to my office at [phone] (please only include page 1 and 2 of the Case Information op note)

## 2022-01-20 NOTE — H&P (Signed)
Daniel [name] MRN:  [id number] DOB/SEX:  26-Oct-1927/male  CHIEF COMPLAINT:  Painful right Knee  HISTORY: Patient is a 88 y.o. male presented with a history of pain in the right knee. Onset of symptoms was gradual starting a few years ago with gradually worsening course since that time. Patient has been treated conservatively with over-the-counter NSAIDs and activity modification. Patient currently rates pain in the knee at 10 out of 10 with activity. There is pain at night.  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)    Pulmonary nodule    Tinnitus    Past Surgical History:  Procedure Laterality Date   HERNIA REPAIR Right 2010   left inguinal hernia repair  06/29/2008   with mesh sail   PROSTATECTOMY  1998   prostate cancer   right ankle  1980   fracture   right knee  1980   torn meniscus     MEDICATIONS:   Medications Prior to Admission  Medication Sig Dispense Refill Last Dose   allopurinol (ZYLOPRIM) 100 MG tablet Take 100 mg by mouth every Monday, Wednesday, and Friday.   01/19/2022   ALPRAZolam (XANAX) 0.5 MG tablet Take 0.5 mg by mouth 3 (three) times daily as needed for sleep (tremors).   01/19/2022   amLODipine (NORVASC) 5 MG tablet Take 5 mg by mouth daily.   01/19/2022   cholecalciferol (VITAMIN D) 1000 units tablet Take 1,000 Units by mouth daily.   Past Week   cyanocobalamin 1000 MCG  tablet Take 1,000 mcg by mouth daily.   Past Week   HYDROcodone-acetaminophen (NORCO/VICODIN) 5-325 MG tablet Take 1 tablet by mouth every 6 (six) hours as needed for moderate pain.   01/19/2022   levothyroxine (SYNTHROID, LEVOTHROID) 100 MCG tablet Take 100 mcg by mouth daily before breakfast.   01/20/2022   magnesium oxide (MAG-OX) 400 MG tablet Take 400 mg by mouth daily.   Past Week   cyanocobalamin (,VITAMIN B-12,) 1000 MCG/ML injection Inject 1 mL (1,000 mcg total) into the muscle See admin instructions for 14 doses. Weekly x 4, and then monthly thereafter 14 mL 0    ixazomib citrate (NINLARO) 3 MG capsule Take 1 capsule (3 mg total) by mouth once a week. Take 3 weeks, then hold for 1 week. Take on an empty stomach 1hr before or 2hrs after food (Patient not taking: Reported on 01/09/2022) 3 capsule 6 Not Taking   lenalidomide (REVLIMID) 10 MG capsule Take 1 capsule (10 mg total) by mouth daily. Take for 21 days. Hold for 7 days. Repeat every 28 days. (Patient not taking: Reported on 01/09/2022) 21 capsule 0 Not Taking    ALLERGIES:  No Known Allergies  REVIEW OF SYSTEMS:  MSK postive for arthralgias, all others negative   FAMILY HISTORY:  History reviewed. No pertinent family history.  SOCIAL HISTORY:   Social History  Tobacco Use   Smoking status: Former   Smokeless tobacco: Not on file  Substance Use Topics   Alcohol use: Yes     EXAMINATION:  Vital signs in last 24 hours: Temp:  [98.6 F (37 C)] 98.6 F (37 C) (02/06 0643) Pulse Rate:  [67] 67 (02/06 0643) Resp:  [13] 13 (02/06 0643) BP: (152)/(66) 152/66 (02/06 0643) SpO2:  [100 %] 100 % (02/06 0643) Weight:  [69.4 kg] 69.4 kg (02/06 0639)  BP (!) 152/66    Pulse 67    Temp 98.6 F (37 C) (Oral)    Resp 13    Wt 69.4 kg    SpO2 100%    BMI 24.32 kg/m   General Appearance:    Alert, cooperative, no distress, appears stated age  Head:    Normocephalic, without obvious abnormality, atraumatic  Eyes:    PERRL,  conjunctiva/corneas clear, EOM's intact, fundi    benign, both eyes  Ears:    Normal TM's and external ear canals, both ears  Nose:   Nares normal, septum midline, mucosa normal, no drainage    or sinus tenderness  Throat:   Lips, mucosa, and tongue normal; teeth and gums normal  Neck:   Supple, symmetrical, trachea midline, no adenopathy;    thyroid:  no enlargement/tenderness/nodules; no carotid   bruit or JVD  Back:     Symmetric, no curvature, ROM normal, no CVA tenderness  Lungs:     Clear to auscultation bilaterally, respirations unlabored  Chest Wall:    No tenderness or deformity   Heart:    Regular rate and rhythm, S1 and S2 normal, no murmur, rub   or gallop  Breast Exam:    No tenderness, masses, or nipple abnormality  Abdomen:     Soft, non-tender, bowel sounds active all four quadrants,    no masses, no organomegaly  Genitalia:    Normal male without lesion, discharge or tenderness  Rectal:    Normal tone, no masses or tenderness;   guaiac negative stool  Extremities:   Extremities normal, atraumatic, no cyanosis or edema  Pulses:   2+ and symmetric all extremities  Skin:   Skin color, texture, turgor normal, no rashes or lesions  Lymph nodes:   Cervical, supraclavicular, and axillary nodes normal  Neurologic:   CNII-XII intact, normal strength, sensation and reflexes    throughout    Musculoskeletal:  ROM 0-120, Ligaments intact,  Imaging Review Plain radiographs demonstrate severe degenerative joint disease of the right knee. The overall alignment is neutral. The bone quality appears to be excellent for age and reported activity level.  Assessment/Plan: Primary osteoarthritis, right knee   The patient history, physical examination and imaging studies are consistent with advanced degenerative joint disease of the right knee. The patient has failed conservative treatment.  The clearance notes were reviewed.  After discussion with the patient it was felt that Total Knee  Replacement was indicated. The procedure,  risks, and benefits of total knee arthroplasty were presented and reviewed. The risks including but not limited to aseptic loosening, infection, blood clots, vascular injury, stiffness, patella tracking problems complications among others were discussed. The patient acknowledged the explanation, agreed to proceed with the plan.  Preoperative templating of the joint replacement has been completed, documented, and submitted to the Operating Room personnel in order to optimize intra-operative equipment management.    Patient's anticipated LOS is less than 2 midnights, meeting these requirements: - Lives within 1 hour of care - Has  a competent adult at home to recover with post-op recover - NO history of  - Chronic pain requiring opiods  - Diabetes  - Coronary Artery Disease  - Heart failure  - Heart attack  - Stroke  - DVT/VTE  - Cardiac arrhythmia  - Respiratory Failure/COPD  - Renal failure  - Anemia  - Advanced Liver disease     Donia Ast 01/20/2022, 7:13 AM

## 2022-01-20 NOTE — Anesthesia Procedure Notes (Addendum)
°  Anesthesia Regional Block: Adductor canal block   Pre-Anesthetic Checklist: , timeout performed,  Correct Patient, Correct Site, Correct Laterality,  Correct Procedure, Correct Position, site marked,  Risks and benefits discussed,  Surgical consent,  Pre-op evaluation,  At surgeon's request and post-op pain management  Laterality: Right  Prep: chloraprep       Needles:  Injection technique: Single-shot  Needle Type: Echogenic Stimulator Needle     Needle Length: 9cm  Needle Gauge: 21     Additional Needles:   Procedures:,,,, ultrasound used (permanent image in chart),,    Narrative:  Start time: 01/20/2022 7:20 AM End time: 01/20/2022 7:25 AM Injection made incrementally with aspirations every 5 mL.  Performed by: Personally  Anesthesiologist: Effie Berkshire, MD  Additional Notes: Patient tolerated the procedure well. Local anesthetic introduced in an incremental fashion under minimal resistance after negative aspirations. No paresthesias were elicited. After completion of the procedure, no acute issues were identified and patient continued to be monitored by RN.

## 2022-01-20 NOTE — Anesthesia Postprocedure Evaluation (Signed)
Anesthesia Post Note  Patient: Daniel [name]  Procedure(s) Performed: TOTAL KNEE ARTHROPLASTY (Right: Knee)     Patient location during evaluation: PACU Anesthesia Type: Spinal Level of consciousness: oriented and awake and alert Pain management: pain level controlled Vital Signs Assessment: post-procedure vital signs reviewed and stable Respiratory status: spontaneous breathing, respiratory function stable and patient connected to nasal cannula oxygen Cardiovascular status: blood pressure returned to baseline and stable Postop Assessment: no headache, no backache and no apparent nausea or vomiting Anesthetic complications: no   No notable events documented.  Last Vitals:  Vitals:   01/20/22 1215 01/20/22 1431  BP: (!) 150/82 (!) 156/80  Pulse: 73 82  Resp: 18 14  Temp: (!) 36.4 C (!) 36.4 C  SpO2: 100% 100%    Last Pain:  Vitals:   01/20/22 1333  TempSrc:   PainSc: [name]

## 2022-01-20 NOTE — OR Nursing (Signed)
°  Attempted foley catheter with 16 fr and with 14 fr catheters with no urine return. Unable  to catheterize patient Doctor Lucey informed

## 2022-01-20 NOTE — Transfer of Care (Signed)
Immediate Anesthesia Transfer of Care Note  Patient: Daniel [name]  Procedure(s) Performed: TOTAL KNEE ARTHROPLASTY (Right: Knee)  Patient Location: PACU  Anesthesia Type:Spinal  Level of Consciousness: awake, alert , oriented and patient cooperative  Airway & Oxygen Therapy: Patient Spontanous Breathing and Patient connected to nasal cannula oxygen  Post-op Assessment: Report given to RN and Post -op Vital signs reviewed and stable  Post vital signs: Reviewed and stable  Last Vitals:  Vitals Value Taken Time  BP 128/71 01/20/22 1030  Temp    Pulse 74 01/20/22 1032  Resp 10 01/20/22 1032  SpO2 97 % 01/20/22 1032  Vitals shown include unvalidated device data.  Last Pain:  Vitals:   01/20/22 0643  TempSrc: Oral  PainSc:          Complications: No notable events documented.

## 2022-01-20 NOTE — Plan of Care (Signed)
?  Problem: Education: ?Goal: Knowledge of General Education information will improve ?Description: Including pain rating scale, medication(s)/side effects and non-pharmacologic comfort measures ?Outcome: Progressing ?  ?Problem: Activity: ?Goal: Risk for activity intolerance will decrease ?Outcome: Progressing ?  ?Problem: Nutrition: ?Goal: Adequate nutrition will be maintained ?Outcome: Progressing ?  ?Problem: Elimination: ?Goal: Will not experience complications related to bowel motility ?Outcome: Progressing ?  ?Problem: Pain Managment: ?Goal: General experience of comfort will improve ?Outcome: Progressing ?  ?Problem: Education: ?Goal: Knowledge of the prescribed therapeutic regimen will improve ?Outcome: Progressing ?  ?Problem: Activity: ?Goal: Ability to avoid complications of mobility impairment will improve ?Outcome: Progressing ?  ?Problem: Pain Management: ?Goal: Pain level will decrease with appropriate interventions ?Outcome: Progressing ?  ?

## 2022-01-20 NOTE — Discharge Instructions (Signed)

## 2022-01-20 NOTE — Anesthesia Procedure Notes (Signed)
Spinal  Start time: 01/20/2022 8:28 AM End time: 01/20/2022 8:30 AM Reason for block: surgical anesthesia Staffing Performed: anesthesiologist  Anesthesiologist: Effie Berkshire, MD Preanesthetic Checklist Completed: patient identified, IV checked, site marked, risks and benefits discussed, surgical consent, monitors and equipment checked, pre-op evaluation and timeout performed Spinal Block Patient position: sitting Prep: DuraPrep and site prepped and draped Location: L3-4 Injection technique: single-shot Needle Needle type: Pencan  Needle gauge: 24 G Needle length: 10 cm Needle insertion depth: 10 cm Additional Notes Patient tolerated well. No immediate complications.  Functioning IV was confirmed and monitors were applied. Sterile prep and drape, including hand hygiene and sterile gloves were used. The patient was positioned and the back was prepped. The skin was anesthetized with lidocaine. Free flow of clear CSF was obtained prior to injecting local anesthetic into the CSF. The spinal needle aspirated freely following injection. The needle was carefully withdrawn. The patient tolerated the procedure well.;

## 2022-01-20 NOTE — Evaluation (Signed)
Physical Therapy Evaluation Patient Details Name: Daniel [name] MRN: [id number] DOB: 1927/05/24 Today's Date: 01/20/2022  History of Present Illness  88 yo S/P  R TKA on 01/20/22. PMH: prostate ca,  HTN, tinnitus, gout, CKD, hernia.  Clinical Impression  The patient is very sweet, proud that he lives alone and does all his chores. Patient reports daughter  will be assisting him at Dc.  Patient  ambulated x 30' using RW. Reports  mild pain. Patient should progress to return home with family.  SPO2 on RA 98%.  Patient performing  exercises as instructed.  Pt admitted with above diagnosis.  Pt currently with functional limitations due to the deficits listed below (see PT Problem List). Pt will benefit from skilled PT to increase their independence and safety with mobility to allow discharge to the venue listed below.         Recommendations for follow up therapy are one component of a multi-disciplinary discharge planning process, led by the attending physician.  Recommendations may be updated based on patient status, additional functional criteria and insurance authorization.  Follow Up Recommendations Follow physician's recommendations for discharge plan and follow up therapies    Assistance Recommended at Discharge Frequent or constant Supervision/Assistance  Patient can return home with the following  A little help with walking and/or transfers;Assistance with cooking/housework;Assist for transportation;Help with stairs or ramp for entrance    Equipment Recommendations Rolling walker (2 wheels)  Recommendations for Other Services       Functional Status Assessment Patient has had a recent decline in their functional status and demonstrates the ability to make significant improvements in function in a reasonable and predictable amount of time.     Precautions / Restrictions Precautions Precautions: Fall;Knee      Mobility  Bed Mobility Overal bed mobility: Needs Assistance Bed  Mobility: Supine to Sit     Supine to sit: Min guard     General bed mobility comments: extra time    Transfers Overall transfer level: Needs assistance Equipment used: Rolling walker (2 wheels) Transfers: Sit to/from Stand Sit to Stand: Min assist           General transfer comment: cues for hand placement and for right leg position prior to sitting    Ambulation/Gait Ambulation/Gait assistance: Min assist Gait Distance (Feet): 30 Feet Assistive device: Rolling walker (2 wheels) Gait Pattern/deviations: Step-to pattern, Step-through pattern, Antalgic       General Gait Details: cues for sequence  Stairs            Wheelchair Mobility    Modified Rankin (Stroke Patients Only)       Balance Overall balance assessment: Needs assistance Sitting-balance support: No upper extremity supported, Feet supported Sitting balance-Leahy Scale: Good     Standing balance support: During functional activity, Bilateral upper extremity supported, Reliant on assistive device for balance Standing balance-Leahy Scale: Fair                               Pertinent Vitals/Pain Pain Assessment Pain Assessment: 0-10 Pain Score: 1  Pain Location: right knee Pain Descriptors / Indicators: Discomfort Pain Intervention(s): Premedicated before session, Ice applied    Home Living Family/patient expects to be discharged to:: Private residence Living Arrangements: Alone Available Help at Discharge: Family;Available 24 hours/day Type of Home: House Home Access: Stairs to enter   [employer] of Steps: 1   Home Layout: One level Home Equipment: None  Prior Function Prior Level of Function : Independent/Modified Independent             Mobility Comments: drives,       Hand Dominance   Dominant Hand: Right    Extremity/Trunk Assessment   Upper Extremity Assessment Upper Extremity Assessment: RUE deficits/detail RUE Deficits /  Details: limited elevation    Lower Extremity Assessment Lower Extremity Assessment: RLE deficits/detail RLE Deficits / Details: 5-80 flexion of  knee    Cervical / Trunk Assessment Cervical / Trunk Assessment: Kyphotic  Communication   Communication: HOH  Cognition Arousal/Alertness: Awake/alert Behavior During Therapy: WFL for tasks assessed/performed Overall Cognitive Status: Within Functional Limits for tasks assessed                                 General Comments: does repeat self        General Comments      Exercises Total Joint Exercises Ankle Circles/Pumps: AROM, Both, 10 reps Quad Sets: AROM, Both, 10 reps Straight Leg Raises: AROM, Right, 5 reps   Assessment/Plan    PT Assessment Patient needs continued PT services  PT Problem List Decreased strength;Decreased knowledge of precautions;Decreased balance;Decreased range of motion;Decreased mobility;Decreased knowledge of use of DME;Decreased activity tolerance;Decreased coordination;Decreased safety awareness       PT Treatment Interventions DME instruction;Therapeutic activities;Gait training;Therapeutic exercise;Patient/family education;Functional mobility training;Stair training;Balance training    PT Goals (Current goals can be found in the Care Plan section)  Acute Rehab PT Goals Patient Stated Goal: to get back being  home PT Goal Formulation: With patient Time For Goal Achievement: 01/27/22 Potential to Achieve Goals: Good    Frequency 7X/week     Co-evaluation               AM-PAC PT "6 Clicks" Mobility  Outcome Measure Help needed turning from your back to your side while in a flat bed without using bedrails?: A Little Help needed moving from lying on your back to sitting on the side of a flat bed without using bedrails?: A Little Help needed moving to and from a bed to a chair (including a wheelchair)?: A Little Help needed standing up from a chair using your arms  (e.g., wheelchair or bedside chair)?: A Little Help needed to walk in hospital room?: A Little Help needed climbing 3-5 steps with a railing? : A Lot 6 Click Score: 17    End of Session Equipment Utilized During Treatment: Gait belt Activity Tolerance: Patient tolerated treatment well Patient left: in chair;with call bell/phone within reach;with chair alarm set Nurse Communication: Mobility status PT Visit Diagnosis: Muscle weakness (generalized) (M62.81);Unsteadiness on feet (R26.81)    Time: 1432-1450 PT Time Calculation (min) (ACUTE ONLY): 18 min   Charges:   PT Evaluation $PT Eval Low Complexity: [place] PT Acute Rehabilitation Services Pager [phone] Office [phone]   [name] 01/20/2022, 3:09 PM

## 2022-01-20 NOTE — Progress Notes (Signed)
Orthopedic Tech Progress Note Patient Details:  Daniel [name] 1926-12-26 [id number]  CPM Right Knee CPM Right Knee: On Right Knee Flexion (Degrees): 90 Right Knee Extension (Degrees): 0 Additional Comments: Zero knee  Post Interventions Patient Tolerated: Well Instructions Provided: Care of device  [name] 01/20/2022, 10:37 AM

## 2022-01-21 ENCOUNTER — Encounter (HOSPITAL_COMMUNITY): Payer: Self-pay | Admitting: Orthopedic Surgery

## 2022-01-21 DIAGNOSIS — M1711 Unilateral primary osteoarthritis, right knee: Secondary | ICD-10-CM | POA: Diagnosis not present

## 2022-01-21 LAB — BASIC METABOLIC PANEL
Anion gap: 8 (ref 5–15)
BUN: 33 mg/dL — ABNORMAL HIGH (ref 8–23)
CO2: 23 mmol/L (ref 22–32)
Calcium: 8.4 mg/dL — ABNORMAL LOW (ref 8.9–10.3)
Chloride: 107 mmol/L (ref 98–111)
Creatinine, Ser: 2.14 mg/dL — ABNORMAL HIGH (ref 0.61–1.24)
GFR, Estimated: 28 mL/min — ABNORMAL LOW (ref >60)
Glucose, Bld: 132 mg/dL — ABNORMAL HIGH (ref 70–99)
Potassium: 4.4 mmol/L (ref 3.5–5.1)
Sodium: 138 mmol/L (ref 135–145)

## 2022-01-21 LAB — CBC
HCT: 30.5 % — ABNORMAL LOW (ref 39.0–52.0)
Hemoglobin: 10.3 g/dL — ABNORMAL LOW (ref 13.0–17.0)
MCH: 34.8 pg — ABNORMAL HIGH (ref 26.0–34.0)
MCHC: 33.8 g/dL (ref 30.0–36.0)
MCV: 103 fL — ABNORMAL HIGH (ref 80.0–100.0)
Platelets: 240 10*3/uL (ref 150–400)
RBC: 2.96 MIL/uL — ABNORMAL LOW (ref 4.22–5.81)
RDW: 12.6 % (ref 11.5–15.5)
WBC: 13.5 10*3/uL — ABNORMAL HIGH (ref 4.0–10.5)
nRBC: 0 % (ref 0.0–0.2)

## 2022-01-21 MED ORDER — APIXABAN 2.5 MG PO TABS: 2.5000 mg | ORAL_TABLET | Freq: Two times a day (BID) | ORAL | 0 refills | Status: DC

## 2022-01-21 MED ORDER — METHOCARBAMOL 500 MG PO TABS: 500.0000 mg | ORAL_TABLET | Freq: Four times a day (QID) | ORAL | 0 refills | Status: DC | PRN

## 2022-01-21 MED ORDER — OXYCODONE HCL 5 MG PO TABS: 5.0000 mg | ORAL_TABLET | ORAL | 0 refills | Status: DC | PRN

## 2022-01-21 NOTE — Discharge Summary (Signed)
SPORTS MEDICINE & JOINT REPLACEMENT   Lara Mulch, MD   Carlyon Shadow, PA-C [address] [place], [place], [place]  [address]                             [phone]  PATIENT ID: Daniel [name]        MRN:  [id number]          DOB/AGE: Mar 19, 1927 / 88 y.o.    DISCHARGE SUMMARY  ADMISSION DATE:    01/20/2022 DISCHARGE DATE:   01/21/2022   ADMISSION DIAGNOSIS: S/P total knee replacement [Z96.659]    DISCHARGE DIAGNOSIS:  Osteoarthritis of right knee M17.11    ADDITIONAL DIAGNOSIS: Principal Problem:   S/P total knee replacement  Past Medical History:  Diagnosis Date   Blood transfusion    CKD (chronic kidney disease) stage 4, GFR 15-29 ml/min (HCC)    Gout    Hearing loss    Hypertension    Hypertriglyceridemia    Hypothyroid    Prediabetes    Prostate cancer ([place])    Pulmonary nodule    Tinnitus     PROCEDURE: Procedure(s): TOTAL KNEE ARTHROPLASTY on 01/20/2022  CONSULTS:    HISTORY:  See H&P in chart  HOSPITAL COURSE:  Daniel [name] is a 88 y.o. admitted on 01/20/2022 and found to have a diagnosis of Osteoarthritis of right knee M17.11.  After appropriate laboratory studies were obtained  they were taken to the operating room on 01/20/2022 and underwent Procedure(s): TOTAL KNEE ARTHROPLASTY.   They were given perioperative antibiotics:  Anti-infectives (From admission, onward)    Start     Dose/Rate Route Frequency Ordered Stop   01/20/22 0615  ceFAZolin (ANCEF) IVPB 2g/100 mL premix        2 g 200 mL/hr over 30 Minutes Intravenous On call to O.R. 01/20/22 3790 01/20/22 0846     .  Patient given tranexamic acid IV or topical and exparel intra-operatively.  Tolerated the procedure well.    POD# 1: Vital signs were stable.  Patient denied Chest pain, shortness of breath, or calf pain.  Patient was started on Aspirin twice daily at 8am.  Consults to PT, OT, and care management were made.  The patient was weight bearing as tolerated.  CPM was placed on the operative  leg 0-90 degrees for 6-8 hours a day. When out of the CPM, patient was placed in the foam block to achieve full extension. Incentive spirometry was taught.  Dressing was changed.       POD #2, Continued  PT for ambulation and exercise program.  IV saline locked.  O2 discontinued.    The remainder of the hospital course was dedicated to ambulation and strengthening.   The patient was discharged on 1 Day Post-Op in  Good condition.  Blood products given:none  DIAGNOSTIC STUDIES: Recent vital signs: Patient Vitals for the past 24 hrs:  BP Temp Temp src Pulse Resp SpO2  01/21/22 1025 -- -- -- 93 -- 98 %  01/21/22 0840 129/65 97.9 F (36.6 C) Oral 83 16 98 %  01/21/22 0543 137/72 97.9 F (36.6 C) Oral 72 15 96 %  01/21/22 0148 126/69 97.8 F (36.6 C) Oral 68 13 98 %  01/20/22 2143 135/78 97.8 F (36.6 C) Oral 66 17 98 %  01/20/22 1900 (!) 142/73 98.2 F (36.8 C) -- 81 16 99 %  01/20/22 1431 (!) 156/80 (!) 97.5 F (36.4 C) -- 82 14  100 %       Recent laboratory studies: Recent Labs    01/14/22 1445 01/20/22 0640 01/21/22 0330  WBC 9.3 9.2 13.5*  HGB 12.3* 12.3* 10.3*  HCT 36.2* 35.7* 30.5*  PLT 249 277 240   Recent Labs    01/14/22 1445 01/20/22 0640 01/21/22 0330  NA 140 138 138  K 5.0 3.8 4.4  CL 110 105 107  CO2 24 23 23   BUN 40* 35* 33*  CREATININE 2.41* 2.27* 2.14*  GLUCOSE 105* 116* 132*  CALCIUM 9.2 9.0 8.4*   No results found for: INR, PROTIME   Recent Radiographic Studies :  No results found.  DISCHARGE INSTRUCTIONS: Discharge Instructions     Call MD / Call 911   Complete by: As directed    If you experience chest pain or shortness of breath, CALL 911 and be transported to the hospital emergency room.  If you develope a fever above 101 F, pus (white drainage) or increased drainage or redness at the wound, or calf pain, call your surgeon's office.   Constipation Prevention   Complete by: As directed    Drink plenty of fluids.  Prune juice may be  helpful.  You may use a stool softener, such as Colace (over the counter) 100 mg twice a day.  Use MiraLax (over the counter) for constipation as needed.   Diet - low sodium heart healthy   Complete by: As directed    Discharge instructions   Complete by: As directed    INSTRUCTIONS AFTER JOINT REPLACEMENT   Remove items at home which could result in a fall. This includes throw rugs or furniture in walking pathways ICE to the affected joint every three hours while awake for 30 minutes at a time, for at least the first 3-5 days, and then as needed for pain and swelling.  Continue to use ice for pain and swelling. You may notice swelling that will progress down to the foot and ankle.  This is normal after surgery.  Elevate your leg when you are not up walking on it.   Continue to use the breathing machine you got in the hospital (incentive spirometer) which will help keep your temperature down.  It is common for your temperature to cycle up and down following surgery, especially at night when you are not up moving around and exerting yourself.  The breathing machine keeps your lungs expanded and your temperature down.   DIET:  As you were doing prior to hospitalization, we recommend a well-balanced diet.  DRESSING / WOUND CARE / SHOWERING  Keep the surgical dressing until follow up.  The dressing is water proof, so you can shower without any extra covering.  IF THE DRESSING FALLS OFF or the wound gets wet inside, change the dressing with sterile gauze.  Please use good hand washing techniques before changing the dressing.  Do not use any lotions or creams on the incision until instructed by your surgeon.    ACTIVITY  Increase activity slowly as tolerated, but follow the weight bearing instructions below.   No driving for 6 weeks or until further direction given by your physician.  You cannot drive while taking narcotics.  No lifting or carrying greater than 10 lbs. until further directed by your  surgeon. Avoid periods of inactivity such as sitting longer than an hour when not asleep. This helps prevent blood clots.  You may return to work once you are authorized by your doctor.  WEIGHT BEARING   Weight bearing as tolerated with assist device (walker, cane, etc) as directed, use it as long as suggested by your surgeon or therapist, typically at least 4-6 weeks.   EXERCISES  Results after joint replacement surgery are often greatly improved when you follow the exercise, range of motion and muscle strengthening exercises prescribed by your doctor. Safety measures are also important to protect the joint from further injury. Any time any of these exercises cause you to have increased pain or swelling, decrease what you are doing until you are comfortable again and then slowly increase them. If you have problems or questions, call your caregiver or physical therapist for advice.   Rehabilitation is important following a joint replacement. After just a few days of immobilization, the muscles of the leg can become weakened and shrink (atrophy).  These exercises are designed to build up the tone and strength of the thigh and leg muscles and to improve motion. Often times heat used for twenty to thirty minutes before working out will loosen up your tissues and help with improving the range of motion but do not use heat for the first two weeks following surgery (sometimes heat can increase post-operative swelling).   These exercises can be done on a training (exercise) mat, on the floor, on a table or on a bed. Use whatever works the best and is most comfortable for you.    Use music or television while you are exercising so that the exercises are a pleasant break in your day. This will make your life better with the exercises acting as a break in your routine that you can look forward to.   Perform all exercises about fifteen times, three times per day or as directed.  You should exercise both the  operative leg and the other leg as well.  Exercises include:   Quad Sets - Tighten up the muscle on the front of the thigh (Quad) and hold for 5-10 seconds.   Straight Leg Raises - With your knee straight (if you were given a brace, keep it on), lift the leg to 60 degrees, hold for 3 seconds, and slowly lower the leg.  Perform this exercise against resistance later as your leg gets stronger.  Leg Slides: Lying on your back, slowly slide your foot toward your buttocks, bending your knee up off the floor (only go as far as is comfortable). Then slowly slide your foot back down until your leg is flat on the floor again.  Angel Wings: Lying on your back spread your legs to the side as far apart as you can without causing discomfort.  Hamstring Strength:  Lying on your back, push your heel against the floor with your leg straight by tightening up the muscles of your buttocks.  Repeat, but this time bend your knee to a comfortable angle, and push your heel against the floor.  You may put a pillow under the heel to make it more comfortable if necessary.   A rehabilitation program following joint replacement surgery can speed recovery and prevent re-injury in the future due to weakened muscles. Contact your doctor or a physical therapist for more information on knee rehabilitation.    CONSTIPATION  Constipation is defined medically as fewer than three stools per week and severe constipation as less than one stool per week.  Even if you have a regular bowel pattern at home, your normal regimen is likely to be disrupted due to multiple reasons following surgery.  Combination  of anesthesia, postoperative narcotics, change in appetite and fluid intake all can affect your bowels.   YOU MUST use at least one of the following options; they are listed in order of increasing strength to get the job done.  They are all available over the counter, and you may need to use some, POSSIBLY even all of these options:     Drink plenty of fluids (prune juice may be helpful) and high fiber foods Colace 100 mg by mouth twice a day  Senokot for constipation as directed and as needed Dulcolax (bisacodyl), take with full glass of water  Miralax (polyethylene glycol) once or twice a day as needed.  If you have tried all these things and are unable to have a bowel movement in the first 3-4 days after surgery call either your surgeon or your primary doctor.    If you experience loose stools or diarrhea, hold the medications until you stool forms back up.  If your symptoms do not get better within 1 week or if they get worse, check with your doctor.  If you experience "the worst abdominal pain ever" or develop nausea or vomiting, please contact the office immediately for further recommendations for treatment.   ITCHING:  If you experience itching with your medications, try taking only a single pain pill, or even half a pain pill at a time.  You can also use Benadryl over the counter for itching or also to help with sleep.   TED HOSE STOCKINGS:  Use stockings on both legs until for at least 2 weeks or as directed by physician office. They may be removed at night for sleeping.  MEDICATIONS:  See your medication summary on the "After Visit Summary" that nursing will review with you.  You may have some home medications which will be placed on hold until you complete the course of blood thinner medication.  It is important for you to complete the blood thinner medication as prescribed.  PRECAUTIONS:  If you experience chest pain or shortness of breath - call 911 immediately for transfer to the hospital emergency department.   If you develop a fever greater that 101 F, purulent drainage from wound, increased redness or drainage from wound, foul odor from the wound/dressing, or calf pain - CONTACT YOUR SURGEON.                                                   FOLLOW-UP APPOINTMENTS:  If you do not already have a post-op  appointment, please call the office for an appointment to be seen by your surgeon.  Guidelines for how soon to be seen are listed in your "After Visit Summary", but are typically between 1-4 weeks after surgery.  OTHER INSTRUCTIONS:   Knee Replacement:  Do not place pillow under knee, focus on keeping the knee straight while resting. CPM instructions: 0-90 degrees, 2 hours in the morning, 2 hours in the afternoon, and 2 hours in the evening. Place foam block, curve side up under heel at all times except when in CPM or when walking.  DO NOT modify, tear, cut, or change the foam block in any way.  POST-OPERATIVE OPIOID TAPER INSTRUCTIONS: It is important to wean off of your opioid medication as soon as possible. If you do not need pain medication after your surgery it is ok to stop  day one. Opioids include: Codeine, Hydrocodone(Norco, Vicodin), Oxycodone(Percocet, oxycontin) and hydromorphone amongst others.  Long term and even short term use of opiods can cause: Increased pain response Dependence Constipation Depression Respiratory depression And more.  Withdrawal symptoms can include Flu like symptoms Nausea, vomiting And more Techniques to manage these symptoms Hydrate well Eat regular healthy meals Stay active Use relaxation techniques(deep breathing, meditating, yoga) Do Not substitute Alcohol to help with tapering If you have been on opioids for less than two weeks and do not have pain than it is ok to stop all together.  Plan to wean off of opioids This plan should start within one week post op of your joint replacement. Maintain the same interval or time between taking each dose and first decrease the dose.  Cut the total daily intake of opioids by one tablet each day Next start to increase the time between doses. The last dose that should be eliminated is the evening dose.     MAKE SURE YOU:  Understand these instructions.  Get help right away if you are not doing well  or get worse.    Thank you for letting us be a part of your medical care team.  It is a privilege we respect greatly.  We hope these instructions will help you stay on track for a fast and full recovery!   Increase activity slowly as tolerated   Complete by: As directed    Post-operative opioid taper instructions:   Complete by: As directed    POST-OPERATIVE OPIOID TAPER INSTRUCTIONS: It is important to wean off of your opioid medication as soon as possible. If you do not need pain medication after your surgery it is ok to stop day one. Opioids include: Codeine, Hydrocodone(Norco, Vicodin), Oxycodone(Percocet, oxycontin) and hydromorphone amongst others.  Long term and even short term use of opiods can cause: Increased pain response Dependence Constipation Depression Respiratory depression And more.  Withdrawal symptoms can include Flu like symptoms Nausea, vomiting And more Techniques to manage these symptoms Hydrate well Eat regular healthy meals Stay active Use relaxation techniques(deep breathing, meditating, yoga) Do Not substitute Alcohol to help with tapering If you have been on opioids for less than two weeks and do not have pain than it is ok to stop all together.  Plan to wean off of opioids This plan should start within one week post op of your joint replacement. Maintain the same interval or time between taking each dose and first decrease the dose.  Cut the total daily intake of opioids by one tablet each day Next start to increase the time between doses. The last dose that should be eliminated is the evening dose.          DISCHARGE MEDICATIONS:   Allergies as of 01/21/2022   No Known Allergies      Medication List     STOP taking these medications    ixazomib citrate 3 MG capsule Commonly known as: [place]   lenalidomide 10 MG capsule Commonly known as: REVLIMID       TAKE these medications    allopurinol 100 MG tablet Commonly known as:  ZYLOPRIM Take 100 mg by mouth every Monday, Wednesday, and Friday.   ALPRAZolam 0.5 MG tablet Commonly known as: XANAX Take 0.5 mg by mouth 3 (three) times daily as needed for sleep (tremors).   amLODipine 5 MG tablet Commonly known as: NORVASC Take 5 mg by mouth daily.   apixaban 2.5 MG Tabs tablet Commonly known as: ELIQUIS  Take 1 tablet (2.5 mg total) by mouth every 12 (twelve) hours.   cholecalciferol 1000 units tablet Commonly known as: VITAMIN D Take 1,000 Units by mouth daily.   cyanocobalamin 1000 MCG tablet Take 1,000 mcg by mouth daily.   cyanocobalamin 1000 MCG/ML injection Commonly known as: (VITAMIN B-12) Inject 1 mL (1,000 mcg total) into the muscle See admin instructions for 14 doses. Weekly x 4, and then monthly thereafter   HYDROcodone-acetaminophen 5-325 MG tablet Commonly known as: NORCO/VICODIN Take 1 tablet by mouth every 6 (six) hours as needed for moderate pain.   levothyroxine 100 MCG tablet Commonly known as: SYNTHROID Take 100 mcg by mouth daily before breakfast.   magnesium oxide 400 MG tablet Commonly known as: MAG-OX Take 400 mg by mouth daily.   methocarbamol 500 MG tablet Commonly known as: ROBAXIN Take 1-2 tablets (500-1,000 mg total) by mouth every 6 (six) hours as needed for muscle spasms.   oxyCODONE 5 MG immediate release tablet Commonly known as: Oxy IR/ROXICODONE Take 1 tablet (5 mg total) by mouth every 4 (four) hours as needed for moderate pain (pain score 4-6). Max 6 pills in 24 hrs               Durable Medical Equipment  (From admission, onward)           Start     Ordered   01/20/22 1217  DME Walker rolling  Once       Question:  Patient needs a walker to treat with the following condition  Answer:  S/P total knee replacement   01/20/22 1216   01/20/22 1217  DME 3 n 1  Once        01/20/22 1216   01/20/22 1217  DME Bedside commode  Once       Question:  Patient needs a bedside commode to treat with the  following condition  Answer:  S/P total knee replacement   01/20/22 1216            FOLLOW UP VISIT:    DISPOSITION: HOME VS. SNF  Dental Antibiotics:  In most cases prophylactic antibiotics for Dental procdeures after total joint surgery are not necessary.  Exceptions are as follows:  1. History of prior total joint infection  2. Severely immunocompromised (Organ Transplant, cancer chemotherapy, Rheumatoid biologic meds such as [place])  3. Poorly controlled diabetes (A1C &gt; 8.0, blood glucose over 200)  If you have one of these conditions, contact your surgeon for an antibiotic prescription, prior to your dental procedure.   CONDITION:  Good   Donia Ast 01/21/2022, 12:31 PM

## 2022-01-21 NOTE — Progress Notes (Signed)
Provided discharge education/instructions to Pt and daughter, all questions and concerns addressed. Pt not in acute distress, discharged home with belongings accompanied by daughter.

## 2022-01-21 NOTE — Addendum Note (Signed)
Addendum  created 01/21/22 1139 by Effie Berkshire, MD   Clinical Note Signed, Intraprocedure Blocks edited

## 2022-01-21 NOTE — Progress Notes (Signed)
SPORTS MEDICINE AND JOINT REPLACEMENT  Lara Mulch, MD    Carlyon Shadow, PA-C [address], [place], [place]  [address]                             [phone]   PROGRESS NOTE  Subjective:  negative for Chest Pain  negative for Shortness of Breath  negative for Nausea/Vomiting   negative for Calf Pain  negative for Bowel Movement   Tolerating Diet: yes         Patient reports pain as 4 on 0-10 scale.    Objective: Vital signs in last 24 hours:   Patient Vitals for the past 24 hrs:  BP Temp Temp src Pulse Resp SpO2  01/21/22 1025 -- -- -- 93 -- 98 %  01/21/22 0840 129/65 97.9 F (36.6 C) Oral 83 16 98 %  01/21/22 0543 137/72 97.9 F (36.6 C) Oral 72 15 96 %  01/21/22 0148 126/69 97.8 F (36.6 C) Oral 68 13 98 %  01/20/22 2143 135/78 97.8 F (36.6 C) Oral 66 17 98 %  01/20/22 1900 (!) 142/73 98.2 F (36.8 C) -- 81 16 99 %  01/20/22 1431 (!) 156/80 (!) 97.5 F (36.4 C) -- 82 14 100 %    @flow {1959:LAST@   Intake/Output from previous day:   02/06 0701 - 02/07 0700 In: 3351.1 [P.O.:950; I.V.:2201.1] Out: 1900 [Urine:1850]   Intake/Output this shift:   02/07 0701 - 02/07 1900 In: 462.6 [P.O.:240; I.V.:222.6] Out: 330 [Urine:330]   Intake/Output      02/06 0701 02/07 0700 02/07 0701 02/08 0700   P.O. 950 240   I.V. (mL/kg) 2201.1 (31.7) 222.6 (3.2)   IV Piggyback 200    Total Intake(mL/kg) 3351.1 (48.3) 462.6 (6.7)   Urine (mL/kg/hr) 1850 (1.1) 330 (0.9)   Blood 50    Total Output 1900 330   Net +1451.1 +132.6           LABORATORY DATA: Recent Labs    01/14/22 1445 01/20/22 0640 01/21/22 0330  WBC 9.3 9.2 13.5*  HGB 12.3* 12.3* 10.3*  HCT 36.2* 35.7* 30.5*  PLT 249 277 240   Recent Labs    01/14/22 1445 01/20/22 0640 01/21/22 0330  NA 140 138 138  K 5.0 3.8 4.4  CL 110 105 107  CO2 24 23 23   BUN 40* 35* 33*  CREATININE 2.41* 2.27* 2.14*  GLUCOSE 105* 116* 132*  CALCIUM 9.2 9.0 8.4*   No results found for: INR,  PROTIME  Examination:  General appearance: alert, cooperative, and no distress Extremities: extremities normal, atraumatic, no cyanosis or edema  Wound Exam: clean, dry, intact   Drainage:  None: wound tissue dry  Motor Exam: Quadriceps and Hamstrings Intact  Sensory Exam: Superficial Peroneal, Deep Peroneal, and Tibial normal   Assessment:    1 Day Post-Op  Procedure(s) (LRB): TOTAL KNEE ARTHROPLASTY (Right)  ADDITIONAL DIAGNOSIS:  Principal Problem:   S/P total knee replacement     Plan: Physical Therapy as ordered Weight Bearing as Tolerated (WBAT)  DVT Prophylaxis:   Eliquis  DISCHARGE PLAN: Home  Patient doing well and ready for D/C home       Patient's anticipated LOS is less than 2 midnights, meeting these requirements:  - Lives within 1 hour of care - Has a competent adult at home to recover with post-op recover - NO history of  - Chronic pain requiring opiods  - Diabetes  -  Coronary Artery Disease  - Heart failure  - Heart attack  - Stroke  - DVT/VTE  - Cardiac arrhythmia  - Respiratory Failure/COPD  - Renal failure  - Anemia  - Advanced Liver disease      Donia Ast 01/21/2022, 12:27 PM

## 2022-01-21 NOTE — Progress Notes (Signed)
Physical Therapy Treatment Patient Details Name: Daniel [name] MRN: [id number] DOB: 05/01/1927 Today's Date: 01/21/2022   History of Present Illness 88 yo S/P  R TKA on 01/20/22. PMH: prostate ca,  HTN, tinnitus, gout, CKD, hernia.    PT Comments    Patient's daughter present. Confirms that family will be available to assist PRN.  Patient required safety cues as he started too ambulate without the Rw. Patient has met PT goals for Dc.   Recommendations for follow up therapy are one component of a multi-disciplinary discharge planning process, led by the attending physician.  Recommendations may be updated based on patient status, additional functional criteria and insurance authorization.  Follow Up Recommendations  Follow physician's recommendations for discharge plan and follow up therapies     Assistance Recommended at Discharge Frequent or constant Supervision/Assistance  Patient can return home with the following A little help with walking and/or transfers;Assistance with cooking/housework;Assist for transportation;Help with stairs or ramp for entrance   Equipment Recommendations  Rolling walker (2 wheels)    Recommendations for Other Services       Precautions / Restrictions Precautions Precautions: Fall;Knee Restrictions RLE Weight Bearing: Weight bearing as tolerated     Mobility  Bed Mobility   Bed Mobility: Supine to Sit     Supine to sit: Min guard, HOB elevated     General bed mobility comments: in recliner    Transfers Overall transfer level: Needs assistance Equipment used: Rolling walker (2 wheels) Transfers: Sit to/from Stand Sit to Stand: Min guard           General transfer comment: cues for safety. patient stood from recliner andd started to walk without RW.    Ambulation/Gait Ambulation/Gait assistance: Min guard Gait Distance (Feet): 140 Feet Assistive device: Rolling walker (2 wheels) Gait Pattern/deviations: Step-through pattern        General Gait Details: cues for sequence   Stairs             Wheelchair Mobility    Modified Rankin (Stroke Patients Only)       Balance Overall balance assessment: Needs assistance Sitting-balance support: No upper extremity supported, Feet supported Sitting balance-Leahy Scale: Good     Standing balance support: During functional activity, Bilateral upper extremity supported, Reliant on assistive device for balance Standing balance-Leahy Scale: Fair                              Cognition Arousal/Alertness: Awake/alert Behavior During Therapy: WFL for tasks assessed/performed                                   General Comments: does repeat self        Exercises Total Joint Exercises Ankle Circles/Pumps: AROM, Both, 10 reps Quad Sets: AROM, Both, 10 reps Heel Slides: AROM, Right, 10 reps Hip ABduction/ADduction: AROM, Right, 10 reps Straight Leg Raises: AROM, Right, 10 reps Goniometric ROM: 5-80  rt knee flex    General Comments        Pertinent Vitals/Pain Pain Assessment Pain Assessment: Faces Faces Pain Scale: Hurts little more Pain Location: right knee Pain Descriptors / Indicators: Sore Pain Intervention(s): Monitored during session    Home Living                          Prior Function  PT Goals (current goals can now be found in the care plan section) Progress towards PT goals: Progressing toward goals    Frequency    7X/week      PT Plan Current plan remains appropriate    Co-evaluation              AM-PAC PT "6 Clicks" Mobility   Outcome Measure  Help needed turning from your back to your side while in a flat bed without using bedrails?: A Little Help needed moving from lying on your back to sitting on the side of a flat bed without using bedrails?: A Little Help needed moving to and from a bed to a chair (including a wheelchair)?: A Little Help needed standing up  from a chair using your arms (e.g., wheelchair or bedside chair)?: A Little Help needed to walk in hospital room?: A Little Help needed climbing 3-5 steps with a railing? : A Little 6 Click Score: 18    End of Session Equipment Utilized During Treatment: Gait belt Activity Tolerance: Patient tolerated treatment well Patient left: in chair;with call bell/phone within reach;with chair alarm set;with family/visitor present Nurse Communication: Mobility status PT Visit Diagnosis: Muscle weakness (generalized) (M62.81);Unsteadiness on feet (R26.81)     Time: 4854-6270 PT Time Calculation (min) (ACUTE ONLY): 33 min  Charges:  $Gait Training: 8-22 mins  $Self Care/Home Management: [place] Pager [phone] Office [phone]    Daniel [name] 01/21/2022, 4:50 PM

## 2022-01-21 NOTE — Progress Notes (Signed)
Physical Therapy Treatment Patient Details Name: Daniel [name] MRN: [id number] DOB: 1927/07/04 Today's Date: 01/21/2022   History of Present Illness 88 yo S/P  R TKA on 01/20/22. PMH: prostate ca,  HTN, tinnitus, gout, CKD, hernia.    PT Comments    The patient  is progressing well. Cues for safety and to keep  using Rw. Will see again when daughter  arrives for family instruction.   Recommendations for follow up therapy are one component of a multi-disciplinary discharge planning process, led by the attending physician.  Recommendations may be updated based on patient status, additional functional criteria and insurance authorization.  Follow Up Recommendations  Follow physician's recommendations for discharge plan and follow up therapies     Assistance Recommended at Discharge Frequent or constant Supervision/Assistance  Patient can return home with the following A little help with walking and/or transfers;Assistance with cooking/housework;Assist for transportation;Help with stairs or ramp for entrance   Equipment Recommendations  Rolling walker (2 wheels)    Recommendations for Other Services       Precautions / Restrictions Precautions Precautions: Fall;Knee Restrictions RLE Weight Bearing: Weight bearing as tolerated     Mobility  Bed Mobility   Bed Mobility: Supine to Sit     Supine to sit: Min guard, HOB elevated     General bed mobility comments: extra time    Transfers Overall transfer level: Needs assistance Equipment used: Rolling walker (2 wheels) Transfers: Sit to/from Stand Sit to Stand: Min guard           General transfer comment: cues for hand placement and for right leg position prior to sitting    Ambulation/Gait Ambulation/Gait assistance: Min guard Gait Distance (Feet): 100 Feet   Gait Pattern/deviations: Step-to pattern, Step-through pattern, Antalgic       General Gait Details: cues for sequence   Stairs              Wheelchair Mobility    Modified Rankin (Stroke Patients Only)       Balance Overall balance assessment: Needs assistance Sitting-balance support: No upper extremity supported, Feet supported Sitting balance-Leahy Scale: Good     Standing balance support: During functional activity, Bilateral upper extremity supported, Reliant on assistive device for balance Standing balance-Leahy Scale: Fair                              Cognition Arousal/Alertness: Awake/alert Behavior During Therapy: WFL for tasks assessed/performed                                   General Comments: does repeat self        Exercises Total Joint Exercises Ankle Circles/Pumps: AROM, Both, 10 reps Quad Sets: AROM, Both, 10 reps Heel Slides: AROM, Right, 10 reps Hip ABduction/ADduction: AROM, Right, 10 reps Straight Leg Raises: AROM, Right, 10 reps Goniometric ROM: 5-80  rt knee flex    General Comments        Pertinent Vitals/Pain Pain Assessment Pain Assessment: Faces Pain Location: right knee Pain Descriptors / Indicators: Sore Pain Intervention(s): Monitored during session    Home Living                          Prior Function            PT Goals (current goals can now be found in  the care plan section) Progress towards PT goals: Progressing toward goals    Frequency    7X/week      PT Plan Current plan remains appropriate    Co-evaluation              AM-PAC PT "6 Clicks" Mobility   Outcome Measure  Help needed turning from your back to your side while in a flat bed without using bedrails?: A Little Help needed moving from lying on your back to sitting on the side of a flat bed without using bedrails?: A Little Help needed moving to and from a bed to a chair (including a wheelchair)?: A Little Help needed standing up from a chair using your arms (e.g., wheelchair or bedside chair)?: A Little Help needed to walk in hospital  room?: A Little Help needed climbing 3-5 steps with a railing? : A Lot 6 Click Score: 17    End of Session Equipment Utilized During Treatment: Gait belt Activity Tolerance: Patient tolerated treatment well Patient left: in chair;with call bell/phone within reach;with chair alarm set Nurse Communication: Mobility status PT Visit Diagnosis: Muscle weakness (generalized) (M62.81);Unsteadiness on feet (R26.81)     Time: 5697-9480 PT Time Calculation (min) (ACUTE ONLY): 24 min  Charges:  $Gait Training: 8-22 mins $Therapeutic Exercise: 8-22 mins                     Tresa Endo PT Acute Rehabilitation Services Pager [phone] Office [phone]    [name] 01/21/2022, 4:46 PM

## 2022-01-21 NOTE — Plan of Care (Signed)

## 2022-01-21 NOTE — TOC Transition Note (Signed)
Transition of Care [facility]) - CM/SW Discharge Note   Patient Details  Name: Daniel [name] MRN: [id number] Date of Birth: 30-Dec-1926  [facility] of [facility] CM/SW Contact:  Lennart Pall, LCSW Phone Number: 01/21/2022, 12:40 PM   Clinical Narrative:    Met with pt and daughter today and confirming pt has all needed DME at home. Plan for OPPT at MD office.  No further TOC needs.   Final next level of care: OP Rehab Barriers to Discharge: No Barriers Identified   Patient Goals and CMS Choice Patient states their goals for this hospitalization and ongoing recovery are:: return home      Discharge Placement                       Discharge Plan and Services                DME Arranged: N/A DME Agency: NA                  Social Determinants of Health (SDOH) Interventions     Readmission Risk Interventions No flowsheet data found.

## 2022-12-23 ENCOUNTER — Other Ambulatory Visit (INDEPENDENT_AMBULATORY_CARE_PROVIDER_SITE_OTHER): Payer: Medicare Other

## 2022-12-23 ENCOUNTER — Encounter: Payer: Self-pay | Admitting: Gastroenterology

## 2022-12-23 ENCOUNTER — Ambulatory Visit (INDEPENDENT_AMBULATORY_CARE_PROVIDER_SITE_OTHER)
Admission: RE | Admit: 2022-12-23 | Discharge: 2022-12-23 | Disposition: A | Discharge status: Home / Self Care | Payer: Medicare Other | Source: Ambulatory Visit | Attending: Gastroenterology | Admitting: Gastroenterology

## 2022-12-23 ENCOUNTER — Ambulatory Visit: Payer: Medicare Other | Admitting: Gastroenterology

## 2022-12-23 VITALS — BP 124/60 | HR 69 | Ht 65.5 in | Wt 138.8 lb

## 2022-12-23 DIAGNOSIS — I4891 Unspecified atrial fibrillation: Secondary | ICD-10-CM

## 2022-12-23 DIAGNOSIS — Z862 Personal history of diseases of the blood and blood-forming organs and certain disorders involving the immune mechanism: Secondary | ICD-10-CM

## 2022-12-23 DIAGNOSIS — R1310 Dysphagia, unspecified: Secondary | ICD-10-CM | POA: Diagnosis not present

## 2022-12-23 DIAGNOSIS — R634 Abnormal weight loss: Secondary | ICD-10-CM | POA: Diagnosis not present

## 2022-12-23 DIAGNOSIS — K219 Gastro-esophageal reflux disease without esophagitis: Secondary | ICD-10-CM | POA: Diagnosis not present

## 2022-12-23 DIAGNOSIS — K5909 Other constipation: Secondary | ICD-10-CM

## 2022-12-23 LAB — CBC
HCT: 41.5 % (ref 39.0–52.0)
Hemoglobin: 14 g/dL (ref 13.0–17.0)
MCHC: 33.7 g/dL (ref 30.0–36.0)
MCV: 103.5 fl — ABNORMAL HIGH (ref 78.0–100.0)
Platelets: 316 10*3/uL (ref 150.0–400.0)
RBC: 4.01 Mil/uL — ABNORMAL LOW (ref 4.22–5.81)
RDW: 12.9 % (ref 11.5–15.5)
WBC: 9.2 10*3/uL (ref 4.0–10.5)

## 2022-12-23 LAB — PROTIME-INR
INR: 1 ratio (ref 0.8–1.0)
Prothrombin Time: 11.1 s (ref 9.6–13.1)

## 2022-12-23 LAB — COMPREHENSIVE METABOLIC PANEL
ALT: 7 U/L (ref 0–53)
AST: 19 U/L (ref 0–37)
Albumin: 4.1 g/dL (ref 3.5–5.2)
Alkaline Phosphatase: 71 U/L (ref 39–117)
BUN: 25 mg/dL — ABNORMAL HIGH (ref 6–23)
CO2: 28 mEq/L (ref 19–32)
Calcium: 9.5 mg/dL (ref 8.4–10.5)
Chloride: 104 mEq/L (ref 96–112)
Creatinine, Ser: 1.9 mg/dL — ABNORMAL HIGH (ref 0.40–1.50)
GFR: 29.93 mL/min — ABNORMAL LOW (ref >60.00)
Glucose, Bld: 106 mg/dL — ABNORMAL HIGH (ref 70–99)
Potassium: 4.4 mEq/L (ref 3.5–5.1)
Sodium: 142 mEq/L (ref 135–145)
Total Bilirubin: 1 mg/dL (ref 0.2–1.2)
Total Protein: 6.6 g/dL (ref 6.0–8.3)

## 2022-12-23 LAB — IBC + FERRITIN
Ferritin: 160.5 ng/mL (ref 22.0–322.0)
Iron: 113 ug/dL (ref 42–165)
Saturation Ratios: 43.2 % (ref 20.0–50.0)
TIBC: 261.8 ug/dL (ref 250.0–450.0)
Transferrin: 187 mg/dL — ABNORMAL LOW (ref 212.0–360.0)

## 2022-12-23 LAB — VITAMIN B12: Vitamin B-12: 630 pg/mL (ref 211–911)

## 2022-12-23 LAB — MAGNESIUM: Magnesium: 2.2 mg/dL (ref 1.5–2.5)

## 2022-12-23 LAB — PHOSPHORUS: Phosphorus: 3.6 mg/dL (ref 2.3–4.6)

## 2022-12-23 LAB — LIPASE: Lipase: 20 U/L (ref 11.0–59.0)

## 2022-12-23 LAB — TSH: TSH: 0.36 u[IU]/mL (ref 0.35–5.50)

## 2022-12-23 LAB — FOLATE: Folate: >23.8 ng/mL (ref >5.9)

## 2022-12-23 MED ORDER — OMEPRAZOLE 40 MG PO CPDR: DELAYED_RELEASE_CAPSULE | ORAL | 2 refills | Status: DC

## 2022-12-23 NOTE — Progress Notes (Signed)
[facility] VISIT   Primary Care Provider Nelly Laurence, NP [place] [address] [phone]  Referring Provider Nelly Laurence, NP [address] [place],  [place] [address] [phone]  Patient Profile: Daniel [name] is a 89 y.o. 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  The patient presents to the [facility] for an evaluation and management of problem(s) noted below:  Problem List 1. Dysphagia, unspecified type   2. Unintentional weight loss   3. Gastroesophageal reflux disease, unspecified whether esophagitis present   4. History of anemia   5. Atrial fibrillation, unspecified type ([place])   6. Chronic constipation     History of Present Illness This is the patient's first visit to the outpatient [place] clinic.  Patient for the last 6 weeks to 8 weeks has been experiencing symptoms of difficulty swallowing.  At times drinking water has been difficult.  He experiences phlegm that is "coming up and choking him after refluxing".  He has had some chest discomfort as a result of this.  They have felt that this is potentially acid reflux related.  He has never actually had burning sensation or true pyrosis however.  Solid foods have been difficult to pass as well.  As a result of issues that have occurred he has lost nearly 10 pounds in the last 2 months.  Previously the patient had never undergone an upper endoscopy or had these types of symptoms before.  He had a colonoscopy more than 20 years ago.  He deals with chronic constipation.  He noticed that over the course the last year his bowel habits have changed even more.  Now he may only use the restroom once every 1 to 2 weeks.  He  however does not have significant bloating or distention or pain.  Once he has not gone for a while he will take MiraLAX and stool softeners and this will help move things along.  He does not take these medications on a regular basis.  He has not noted any blood in his stools (melena/hematochezia/maroon stools).  He previously had a history of what sounds to be atrial fibrillation during hospitalization in 2023.  His family reports being on anticoagulation for a few months and then that was stopped recently.  However patient was also on medication (possibly metoprolol) that he had been on for heart rate issues but that had also been stopped.  He has not followed up with a cardiologist as he had only seen them during his hospitalization.  He does not take nonsteroidals or BC/Goody powders on a regular basis.  GI Review of Systems Positive as above Negative for odynophagia, abdominal pain, incontinence  Review of Systems General: Denies fevers/chills HEENT: Denies oral lesions Cardiovascular: Denies current chest pain/palpitations Pulmonary: Denies current shortness of breath/cough Gastroenterological: See HPI Genitourinary: Denies darkened urine or hematuria Hematological: Denies easy bruising/bleeding Endocrine: Denies temperature intolerance Dermatological: Denies jaundice Psychological: Mood is stable  Medications Current Outpatient Medications  Medication Sig Dispense Refill   allopurinol (ZYLOPRIM) 100 MG tablet Take 100 mg by mouth every Monday, Wednesday, and Friday.     ALPRAZolam (XANAX) 0.5 MG tablet Take 0.5 mg by mouth 3 (three) times daily as needed for sleep (tremors).     amLODipine (NORVASC) 5 MG tablet Take 5 mg by mouth  daily.     cholecalciferol (VITAMIN D) 1000 units tablet Take 1,000 Units by mouth daily.     levothyroxine (SYNTHROID, LEVOTHROID) 100 MCG tablet Take 100 mcg by mouth daily before breakfast.     magnesium oxide (MAG-OX) 400 MG tablet Take 400 mg by mouth  daily.     omeprazole (PRILOSEC) 40 MG capsule Open 1 capsule and sprinkle in apple sauce twice daily. 60 capsule 2   apixaban (ELIQUIS) 2.5 MG TABS tablet Take 1 tablet (2.5 mg total) by mouth 2 (two) times daily. 180 tablet 3   metoprolol succinate (TOPROL XL) 25 MG 24 hr tablet Take 0.5 tablets (12.5 mg total) by mouth daily. 45 tablet 3   No current facility-administered medications for this visit.    Allergies No Known Allergies  Histories Past Medical History:  Diagnosis Date   Blood transfusion    CKD (chronic kidney disease) stage 4, GFR 15-29 ml/min (HCC)    DVT (deep venous thrombosis) (HCC)    from surgery, no longer on Eliquis   Gout    Hearing loss    Hypertension    Hypertriglyceridemia    Hypothyroid    Prediabetes    Prostate cancer [facility])    Pulmonary nodule    Tinnitus    Past Surgical History:  Procedure Laterality Date   CATARACT EXTRACTION Bilateral 2020   HERNIA REPAIR Right 2010   left inguinal hernia repair  06/29/2008   with mesh sail   PROSTATECTOMY  1998   prostate cancer   right ankle  1980   fracture   right knee  1980   torn meniscus   TOTAL KNEE ARTHROPLASTY Right 01/20/2022   Procedure: TOTAL KNEE ARTHROPLASTY;  Surgeon: Vickey Huger, MD;  Location: [facility];  Service: Orthopedics;  Laterality: Right;   Social History   Socioeconomic History   Marital status: Widowed    Spouse name: Not on file   Number of children: Not on file   Years of education: Not on file   Highest education level: Not on file  Occupational History   Occupation: retired Actor  Tobacco Use   Smoking status: Former   Smokeless tobacco: Not on Landscape architect Use: Never used  Substance and Sexual Activity   Alcohol use: Yes   Drug use: No   Sexual activity: Not on file  Other Topics Concern   Not on file  Social History Narrative   Not on file   Social Determinants of Health   Financial Resource Strain: Not on file  Food  Insecurity: Not on file  Transportation Needs: Not on file  Physical Activity: Not on file  Stress: Not on file  Social Connections: Not on file  Intimate Partner Violence: Not on file   Family History  Problem Relation Age of Onset   Heart disease Mother    Heart disease Father    Heart disease Brother    Prostate cancer Brother    Colon cancer Neg Hx    Esophageal cancer Neg Hx    Inflammatory bowel disease Neg Hx    Liver disease Neg Hx    Pancreatic cancer Neg Hx    Rectal cancer Neg Hx    Stomach cancer Neg Hx    I have reviewed his medical, social, and family history in detail and updated the electronic medical record as necessary.    PHYSICAL EXAMINATION  BP 124/60   Pulse 69   Ht 5' 5.5" (1.664  m)   Wt 138 lb 12.8 oz (63 kg)   BMI 22.75 kg/m  Wt Readings from Last 3 Encounters:  12/24/22 138 lb (62.6 kg)  12/23/22 138 lb 12.8 oz (63 kg)  01/20/22 153 lb (69.4 kg)  GEN: NAD, elderly in appearance, nontoxic, accompanied by family PSYCH: Cooperative, without pressured speech EYE: Conjunctivae pale-pink, sclerae anicteric ENT: MMM, without oral ulcers CV: Irregularly irregular RESP: CTAB posteriorly, without wheezing GI: NABS, soft, NT/ND, without rebound or guarding, no HSM appreciated MSK/EXT: Trace bilateral pedal edema SKIN: No jaundice NEURO:  Alert & Oriented x 3, no focal deficits   REVIEW OF DATA  I reviewed the following data at the time of this encounter:  GI Procedures and Studies  Reports outside colonoscopy greater than 20 years ago with polyps then stopped screening due to guidelines  Laboratory Studies  Reviewed those in epic and care everywhere  Imaging Studies  No relevant studies to review   ASSESSMENT  [name] is a 89 y.o. 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   The patient is seen today for evaluation and management of:  1. Dysphagia, unspecified type   2. Unintentional weight loss   3. Gastroesophageal reflux disease, unspecified whether esophagitis present   4. History of anemia   5. Atrial fibrillation, unspecified type ([place])   6. Chronic constipation    The patient is hemodynamically stable today.  It does look like he does have atrial fibrillation in office today but from a blood pressure standpoint he is doing well without any symptoms.  This is going to be important moving forward.  From a GI standpoint, he has pretty significant severe dysphagia symptoms.  He is unintentional weight loss is concerning as well.  A diagnostic endoscopy is recommended for this individual but I need to make sure that he is stable or healthy enough from a heart standpoint to undergo anesthesia.  In the interim as we get him scheduled for an EGD I need him to be evaluated by cardiology.  Should the patient need anticoagulation, then we will make decisions with cardiology as to how we manage at around an endoscopy which I hope to perform within the next 2 to 4 weeks.  This will have to be a hospital-based endoscopy due to patient's age and medical comorbidities and likely new diagnosis of atrial fibrillation (or paroxysmal atrial fibrillation).  In the interim I am going to start the patient on PPI therapy.  We are also going to have the patient undergo a barium swallow while we are waiting on optimization from a cardiology standpoint.  The risks and benefits of endoscopic evaluation were discussed with the patient; these include but are not limited to the risk of perforation, infection, bleeding, missed lesions, lack of diagnosis, severe illness requiring hospitalization, as well as anesthesia and sedation related illnesses.  The patient and/or family is agreeable to proceed.  From a constipation standpoint, I think  things will need to be further discussed based on what we find a reason/etiology for his symptoms from above.  I have asked the patient to initiate more fiber in his diet but as long as he is doing and feeling well, we will not need to do further workup.  Should the upper endoscopy evaluation be unremarkable he will need a CT/chest/abdomen/pelvis to be performed.  All patient  questions were answered to the best of my ability, and the patient agrees to the aforementioned plan of action with follow-up as indicated.   PLAN  Laboratories as outlined below KUB to be performed today Barium swallow to be performed Omeprazole 40 mg twice daily - Capsule open and placed sprinkles in applesauce to optimize absorption Proceed with scheduling diagnostic/therapeutic EGD with dilation Urgent referral to cardiology for likely paroxysmal atrial fibrillation recurrence and anticoagulation needs   Orders Placed This Encounter  Procedures   Procedural/ Surgical Case Request: ESOPHAGOGASTRODUODENOSCOPY (EGD) WITH PROPOFOL   DG ESOPHAGUS W SINGLE CM (SOL OR THIN BA)   DG Abd 2 Views   CBC   Comp Met (CMET)   Calcium, ionized   Lipase   TSH   IBC + Ferritin   B12   Folate   INR/PT   Magnesium   Phosphorus   Ambulatory referral to Gastroenterology   Ambulatory referral to Cardiology    New Prescriptions   APIXABAN (ELIQUIS) 2.5 MG TABS TABLET    Take 1 tablet (2.5 mg total) by mouth 2 (two) times daily.   METOPROLOL SUCCINATE (TOPROL XL) 25 MG 24 HR TABLET    Take 0.5 tablets (12.5 mg total) by mouth daily.   OMEPRAZOLE (PRILOSEC) 40 MG CAPSULE    Open 1 capsule and sprinkle in apple sauce twice daily.   Modified Medications   No medications on file    Planned Follow Up No follow-ups on file.   Total Time in Face-to-Face and in Coordination of Care for patient including independent/personal interpretation/review of prior testing, medical history, examination, medication adjustment,  communicating results with the patient directly, and documentation within the EHR is 45 minutes.   Justice Britain, MD [facility] Office # [phone]

## 2022-12-23 NOTE — Patient Instructions (Addendum)
You have been scheduled for a Barium Esophogram at [facility] (1st floor of the hospital) on 12/29/22 at 10:00 am . Please arrive 30 minutes prior to your appointment for registration. Make certain not to have anything to eat or drink 3 hours prior to your test. If you need to reschedule for any reason, please contact radiology at [phone] to do so. __________________________________________________________________ A barium swallow is an examination that concentrates on views of the esophagus. This tends to be a double contrast exam (barium and two liquids which, when combined, create a gas to distend the wall of the oesophagus) or single contrast (non-ionic iodine based). The study is usually tailored to your symptoms so a good history is essential. Attention is paid during the study to the form, structure and configuration of the esophagus, looking for functional disorders (such as aspiration, dysphagia, achalasia, motility and reflux) EXAMINATION You may be asked to change into a gown, depending on the type of swallow being performed. A radiologist and radiographer will perform the procedure. The radiologist will advise you of the type of contrast selected for your procedure and direct you during the exam. You will be asked to stand, sit or lie in several different positions and to hold a small amount of fluid in your mouth before being asked to swallow while the imaging is performed .In some instances you may be asked to swallow barium coated marshmallows to assess the motility of a solid food bolus. The exam can be recorded as a digital or video fluoroscopy procedure. POST PROCEDURE It will take 1-2 days for the barium to pass through your system. To facilitate this, it is important, unless otherwise directed, to increase your fluids for the next 24-48hrs and to resume your normal diet.  This test typically takes about 30 minutes to  perform. ______________________________________________________________________________   Dennis Bast have been scheduled for an endoscopy. Please follow written instructions given to you at your visit today. If you use inhalers (even only as needed), please bring them with you on the day of your procedure.   Your provider has requested that you go to the basement level for lab work before leaving today. Press "B" on the elevator. The lab is located at the first door on the left as you exit the elevator.  Urgent referral has been placed to Cardiology. If you have not heard from Cardiology within 1 week , please let us know.   We have sent the following medications to your pharmacy for you to pick up at your convenience: Omeprazole- Open 1 capsule and sprinkle in applesauce -twice daily.   Due to recent changes in healthcare laws, you may see the results of your imaging and laboratory studies on MyChart before your provider has had a chance to review them.  We understand that in some cases there may be results that are confusing or concerning to you. Not all laboratory results come back in the same time frame and the provider may be waiting for multiple results in order to interpret others.  Please give [place] 48 hours in order for your provider to thoroughly review all the results before contacting the office for clarification of your results.   Thank you for choosing me and [facility].  Dr. Rush Landmark

## 2022-12-23 NOTE — H&P (View-Only) (Signed)
[place] VISIT   Primary Care Provider Nelly Laurence, NP [place] [address] [phone]  Referring Provider Nelly Laurence, NP [address] [place],  [place] [address] [phone]  Patient Profile: Daniel [name] is a 89 y.o. 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  The patient presents to the [facility] for an evaluation and management of problem(s) noted below:  Problem List 1. Dysphagia, unspecified type   2. Unintentional weight loss   3. Gastroesophageal reflux disease, unspecified whether esophagitis present   4. History of anemia   5. Atrial fibrillation, unspecified type ([place])   6. Chronic constipation     History of Present Illness This is the patient's first visit to the outpatient [facility] clinic.  Patient for the last 6 weeks to 8 weeks has been experiencing symptoms of difficulty swallowing.  At times drinking water has been difficult.  He experiences phlegm that is "coming up and choking him after refluxing".  He has had some chest discomfort as a result of this.  They have felt that this is potentially acid reflux related.  He has never actually had burning sensation or true pyrosis however.  Solid foods have been difficult to pass as well.  As a result of issues that have occurred he has lost nearly 10 pounds in the last 2 months.  Previously the patient had never undergone an upper endoscopy or had these types of symptoms before.  He had a colonoscopy more than 20 years ago.  He deals with chronic constipation.  He noticed that over the course the last year his bowel habits have changed even more.  Now he may only use the restroom once every 1 to 2 weeks.  He  however does not have significant bloating or distention or pain.  Once he has not gone for a while he will take MiraLAX and stool softeners and this will help move things along.  He does not take these medications on a regular basis.  He has not noted any blood in his stools (melena/hematochezia/maroon stools).  He previously had a history of what sounds to be atrial fibrillation during hospitalization in 2023.  His family reports being on anticoagulation for a few months and then that was stopped recently.  However patient was also on medication (possibly metoprolol) that he had been on for heart rate issues but that had also been stopped.  He has not followed up with a cardiologist as he had only seen them during his hospitalization.  He does not take nonsteroidals or BC/Goody powders on a regular basis.  GI Review of Systems Positive as above Negative for odynophagia, abdominal pain, incontinence  Review of Systems General: Denies fevers/chills HEENT: Denies oral lesions Cardiovascular: Denies current chest pain/palpitations Pulmonary: Denies current shortness of breath/cough Gastroenterological: See HPI Genitourinary: Denies darkened urine or hematuria Hematological: Denies easy bruising/bleeding Endocrine: Denies temperature intolerance Dermatological: Denies jaundice Psychological: Mood is stable  Medications Current Outpatient Medications  Medication Sig Dispense Refill   allopurinol (ZYLOPRIM) 100 MG tablet Take 100 mg by mouth every Monday, Wednesday, and Friday.     ALPRAZolam (XANAX) 0.5 MG tablet Take 0.5 mg by mouth 3 (three) times daily as needed for sleep (tremors).     amLODipine (NORVASC) 5 MG tablet Take 5 mg by mouth  daily.     cholecalciferol (VITAMIN D) 1000 units tablet Take 1,000 Units by mouth daily.     levothyroxine (SYNTHROID, LEVOTHROID) 100 MCG tablet Take 100 mcg by mouth daily before breakfast.     magnesium oxide (MAG-OX) 400 MG tablet Take 400 mg by mouth  daily.     omeprazole (PRILOSEC) 40 MG capsule Open 1 capsule and sprinkle in apple sauce twice daily. 60 capsule 2   apixaban (ELIQUIS) 2.5 MG TABS tablet Take 1 tablet (2.5 mg total) by mouth 2 (two) times daily. 180 tablet 3   metoprolol succinate (TOPROL XL) 25 MG 24 hr tablet Take 0.5 tablets (12.5 mg total) by mouth daily. 45 tablet 3   No current facility-administered medications for this visit.    Allergies No Known Allergies  Histories Past Medical History:  Diagnosis Date   Blood transfusion    CKD (chronic kidney disease) stage 4, GFR 15-29 ml/min (HCC)    DVT (deep venous thrombosis) (HCC)    from surgery, no longer on Eliquis   Gout    Hearing loss    Hypertension    Hypertriglyceridemia    Hypothyroid    Prediabetes    Prostate cancer [facility])    Pulmonary nodule    Tinnitus    Past Surgical History:  Procedure Laterality Date   CATARACT EXTRACTION Bilateral 2020   HERNIA REPAIR Right 2010   left inguinal hernia repair  06/29/2008   with mesh sail   PROSTATECTOMY  1998   prostate cancer   right ankle  1980   fracture   right knee  1980   torn meniscus   TOTAL KNEE ARTHROPLASTY Right 01/20/2022   Procedure: TOTAL KNEE ARTHROPLASTY;  Surgeon: Vickey Huger, MD;  Location: [facility];  Service: Orthopedics;  Laterality: Right;   Social History   Socioeconomic History   Marital status: Widowed    Spouse name: Not on file   Number of children: Not on file   Years of education: Not on file   Highest education level: Not on file  Occupational History   Occupation: retired Actor  Tobacco Use   Smoking status: Former   Smokeless tobacco: Not on Landscape architect Use: Never used  Substance and Sexual Activity   Alcohol use: Yes   Drug use: No   Sexual activity: Not on file  Other Topics Concern   Not on file  Social History Narrative   Not on file   Social Determinants of Health   Financial Resource Strain: Not on file  Food  Insecurity: Not on file  Transportation Needs: Not on file  Physical Activity: Not on file  Stress: Not on file  Social Connections: Not on file  Intimate Partner Violence: Not on file   Family History  Problem Relation Age of Onset   Heart disease Mother    Heart disease Father    Heart disease Brother    Prostate cancer Brother    Colon cancer Neg Hx    Esophageal cancer Neg Hx    Inflammatory bowel disease Neg Hx    Liver disease Neg Hx    Pancreatic cancer Neg Hx    Rectal cancer Neg Hx    Stomach cancer Neg Hx    I have reviewed his medical, social, and family history in detail and updated the electronic medical record as necessary.    PHYSICAL EXAMINATION  BP 124/60   Pulse 69   Ht 5' 5.5" (1.664  m)   Wt 138 lb 12.8 oz (63 kg)   BMI 22.75 kg/m  Wt Readings from Last 3 Encounters:  12/24/22 138 lb (62.6 kg)  12/23/22 138 lb 12.8 oz (63 kg)  01/20/22 153 lb (69.4 kg)  GEN: NAD, elderly in appearance, nontoxic, accompanied by family PSYCH: Cooperative, without pressured speech EYE: Conjunctivae pale-pink, sclerae anicteric ENT: MMM, without oral ulcers CV: Irregularly irregular RESP: CTAB posteriorly, without wheezing GI: NABS, soft, NT/ND, without rebound or guarding, no HSM appreciated MSK/EXT: Trace bilateral pedal edema SKIN: No jaundice NEURO:  Alert & Oriented x 3, no focal deficits   REVIEW OF DATA  I reviewed the following data at the time of this encounter:  GI Procedures and Studies  Reports outside colonoscopy greater than 20 years ago with polyps then stopped screening due to guidelines  Laboratory Studies  Reviewed those in epic and care everywhere  Imaging Studies  No relevant studies to review   ASSESSMENT  [name] is a 89 y.o. 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   The patient is seen today for evaluation and management of:  1. Dysphagia, unspecified type   2. Unintentional weight loss   3. Gastroesophageal reflux disease, unspecified whether esophagitis present   4. History of anemia   5. Atrial fibrillation, unspecified type ([place])   6. Chronic constipation    The patient is hemodynamically stable today.  It does look like he does have atrial fibrillation in office today but from a blood pressure standpoint he is doing well without any symptoms.  This is going to be important moving forward.  From a GI standpoint, he has pretty significant severe dysphagia symptoms.  He is unintentional weight loss is concerning as well.  A diagnostic endoscopy is recommended for this individual but I need to make sure that he is stable or healthy enough from a heart standpoint to undergo anesthesia.  In the interim as we get him scheduled for an EGD I need him to be evaluated by cardiology.  Should the patient need anticoagulation, then we will make decisions with cardiology as to how we manage at around an endoscopy which I hope to perform within the next 2 to 4 weeks.  This will have to be a hospital-based endoscopy due to patient's age and medical comorbidities and likely new diagnosis of atrial fibrillation (or paroxysmal atrial fibrillation).  In the interim I am going to start the patient on PPI therapy.  We are also going to have the patient undergo a barium swallow while we are waiting on optimization from a cardiology standpoint.  The risks and benefits of endoscopic evaluation were discussed with the patient; these include but are not limited to the risk of perforation, infection, bleeding, missed lesions, lack of diagnosis, severe illness requiring hospitalization, as well as anesthesia and sedation related illnesses.  The patient and/or family is agreeable to proceed.  From a constipation standpoint, I think  things will need to be further discussed based on what we find a reason/etiology for his symptoms from above.  I have asked the patient to initiate more fiber in his diet but as long as he is doing and feeling well, we will not need to do further workup.  Should the upper endoscopy evaluation be unremarkable he will need a CT/chest/abdomen/pelvis to be performed.  All patient  questions were answered to the best of my ability, and the patient agrees to the aforementioned plan of action with follow-up as indicated.   PLAN  Laboratories as outlined below KUB to be performed today Barium swallow to be performed Omeprazole 40 mg twice daily - Capsule open and placed sprinkles in applesauce to optimize absorption Proceed with scheduling diagnostic/therapeutic EGD with dilation Urgent referral to cardiology for likely paroxysmal atrial fibrillation recurrence and anticoagulation needs   Orders Placed This Encounter  Procedures   Procedural/ Surgical Case Request: ESOPHAGOGASTRODUODENOSCOPY (EGD) WITH PROPOFOL   DG ESOPHAGUS W SINGLE CM (SOL OR THIN BA)   DG Abd 2 Views   CBC   Comp Met (CMET)   Calcium, ionized   Lipase   TSH   IBC + Ferritin   B12   Folate   INR/PT   Magnesium   Phosphorus   Ambulatory referral to Gastroenterology   Ambulatory referral to Cardiology    New Prescriptions   APIXABAN (ELIQUIS) 2.5 MG TABS TABLET    Take 1 tablet (2.5 mg total) by mouth 2 (two) times daily.   METOPROLOL SUCCINATE (TOPROL XL) 25 MG 24 HR TABLET    Take 0.5 tablets (12.5 mg total) by mouth daily.   OMEPRAZOLE (PRILOSEC) 40 MG CAPSULE    Open 1 capsule and sprinkle in apple sauce twice daily.   Modified Medications   No medications on file    Planned Follow Up No follow-ups on file.   Total Time in Face-to-Face and in Coordination of Care for patient including independent/personal interpretation/review of prior testing, medical history, examination, medication adjustment,  communicating results with the patient directly, and documentation within the EHR is 45 minutes.   Justice Britain, MD [facility] Advanced Endoscopy Office # [phone]

## 2022-12-24 ENCOUNTER — Telehealth: Payer: Self-pay

## 2022-12-24 ENCOUNTER — Ambulatory Visit: Payer: Medicare Other | Attending: Cardiology | Admitting: Cardiology

## 2022-12-24 ENCOUNTER — Encounter: Payer: Self-pay | Admitting: Gastroenterology

## 2022-12-24 ENCOUNTER — Encounter: Payer: Self-pay | Admitting: Cardiology

## 2022-12-24 ENCOUNTER — Ambulatory Visit: Payer: Medicare Other

## 2022-12-24 VITALS — BP 108/70 | HR 69 | Ht 65.5 in | Wt 138.0 lb

## 2022-12-24 DIAGNOSIS — I10 Essential (primary) hypertension: Secondary | ICD-10-CM

## 2022-12-24 DIAGNOSIS — I480 Paroxysmal atrial fibrillation: Secondary | ICD-10-CM

## 2022-12-24 DIAGNOSIS — Z862 Personal history of diseases of the blood and blood-forming organs and certain disorders involving the immune mechanism: Secondary | ICD-10-CM | POA: Insufficient documentation

## 2022-12-24 DIAGNOSIS — K219 Gastro-esophageal reflux disease without esophagitis: Secondary | ICD-10-CM | POA: Insufficient documentation

## 2022-12-24 DIAGNOSIS — I4891 Unspecified atrial fibrillation: Secondary | ICD-10-CM | POA: Insufficient documentation

## 2022-12-24 DIAGNOSIS — R1310 Dysphagia, unspecified: Secondary | ICD-10-CM | POA: Insufficient documentation

## 2022-12-24 DIAGNOSIS — R634 Abnormal weight loss: Secondary | ICD-10-CM | POA: Insufficient documentation

## 2022-12-24 LAB — CALCIUM, IONIZED: Calcium, Ion: 5 mg/dL (ref 4.5–5.5)

## 2022-12-24 MED ORDER — APIXABAN 2.5 MG PO TABS: 2.5000 mg | ORAL_TABLET | Freq: Two times a day (BID) | ORAL | 3 refills | Status: DC

## 2022-12-24 MED ORDER — METOPROLOL SUCCINATE ER 25 MG PO TB24: 12.5000 mg | ORAL_TABLET | Freq: Every day | ORAL | 3 refills | Status: DC

## 2022-12-24 NOTE — Progress Notes (Unsigned)
Enrolled for [employer] to mail a ZIO XT long term holter monitor to the patients address on file.  

## 2022-12-24 NOTE — Patient Instructions (Addendum)
Medication Instructions:  Your physician has recommended you make the following change in your medication:  START: Eliquis 2.'5mg'$  twice daily  START: Toprol 12.'5mg'$  daily  *If you need a refill on your cardiac medications before your next appointment, please call your pharmacy*   Lab Work: NONE If you have labs (blood work) drawn today and your tests are completely normal, you will receive your results only by: [place] (if you have MyChart) OR A paper copy in the mail If you have any lab test that is abnormal or we need to change your treatment, we will call you to review the results.   Testing/Procedures: Your physician has requested that you have an echocardiogram. Echocardiography is a painless test that uses sound waves to create images of your heart. It provides your doctor with information about the size and shape of your heart and how well your heart's chambers and valves are working. This procedure takes approximately one hour. There are no restrictions for this procedure. Please do NOT wear cologne, perfume, aftershave, or lotions (deodorant is allowed). Please arrive 15 minutes prior to your appointment time.  ZIO XT- Long Term Monitor Instructions  Your physician has requested you wear a ZIO patch monitor for 7 days.  This is a single patch monitor. [employer] supplies one patch monitor per enrollment. Additional stickers are not available. Please do not apply patch if you will be having a Nuclear Stress Test,  Echocardiogram, Cardiac CT, MRI, or Chest Xray during the period you would be wearing the  monitor. The patch cannot be worn during these tests. You cannot remove and re-apply the  ZIO XT patch monitor.  Your ZIO patch monitor will be mailed 3 day USPS to your address on file. It may take 3-5 days  to receive your monitor after you have been enrolled.  Once you have received your monitor, please review the enclosed instructions. Your monitor  has already been  registered assigning a specific monitor serial # to you.  Billing and Patient Assistance Program Information  We have supplied [employer] with any of your insurance information on file for billing purposes. [employer] offers a sliding scale Patient Assistance Program for patients that do not have  insurance, or whose insurance does not completely cover the cost of the ZIO monitor.  You must apply for the Patient Assistance Program to qualify for this discounted rate.  To apply, please call [employer] at [phone], select option 4, select option 2, ask to apply for  Patient Assistance Program. Theodore Demark will ask your household income, and how many people  are in your household. They will quote your out-of-pocket cost based on that information.  [employer] will also be able to set up a 62-month interest-free payment plan if needed.  Applying the monitor Shave hair from upper left chest.  Hold abrader disc by orange tab. Rub abrader in 40 strokes over the upper left chest as  indicated in your monitor instructions.  Clean area with 4 enclosed alcohol pads. Let dry.  Apply patch as indicated in monitor instructions. Patch will be placed under collarbone on left  side of chest with arrow pointing upward.  Rub patch adhesive wings for 2 minutes. Remove white label marked "1". Remove the white  label marked "2". Rub patch adhesive wings for 2 additional minutes.  While looking in a mirror, press and release button in center of patch. A small green light will  flash 3-4 times. This will be your only indicator that the monitor  has been turned on.  Do not shower for the first 24 hours. You may shower after the first 24 hours.  Press the button if you feel a symptom. You will hear a small click. Record Date, Time and  Symptom in the Patient Logbook.  When you are ready to remove the patch, follow instructions on the last 2 pages of Patient  Logbook. Stick patch monitor onto the last page of Patient Logbook.   Place Patient Logbook in the blue and white box. Use locking tab on box and tape box closed  securely. The blue and white box has prepaid postage on it. Please place it in the mailbox as  soon as possible. Your physician should have your test results approximately 7 days after the  monitor has been mailed back to [facility]  Call [place] at [phone] if you have questions regarding  your ZIO XT patch monitor. Call them immediately if you see an orange light blinking on your  monitor.  If your monitor falls off in less than 4 days, contact our Monitor department at [phone].  If your monitor becomes loose or falls off after 4 days call [employer] at [phone] for  suggestions on securing your monitor    Follow-Up: At [facility], you and your health needs are our priority.  As part of our continuing mission to provide you with exceptional heart care, we have created designated Provider Care Teams.  These Care Teams include your primary Cardiologist (physician) and Advanced Practice Providers (APPs -  Physician Assistants and Nurse Practitioners) who all work together to provide you with the care you need, when you need it.  We recommend signing up for the patient portal called "MyChart".  Sign up information is provided on this After Visit Summary.  MyChart is used to connect with patients for Virtual Visits (Telemedicine).  Patients are able to view lab/test results, encounter notes, upcoming appointments, etc.  Non-urgent messages can be sent to your provider as well.   To learn more about what you can do with MyChart, go to [email or web address].    Your next appointment:   6 month(s)  The format for your next appointment:   In Person  Provider:   Berniece Salines, DO

## 2022-12-24 NOTE — Progress Notes (Signed)
Cardiology Office Note:    Date:  12/25/2022   ID:  Daniel [name], DOB 10/26/1927, MRN [id number]  PCP:  Nelly Laurence, NP  Cardiologist:  Berniece Salines, DO  Electrophysiologist:  None   Referring MD: Irving Copas.*   " I am ok"  History of Present Illness:    Daniel [name] is a 89 y.o. male with a hx of CAD, hypertension, hypertriglyceridemia, history of DVT with previously on Eliquis.   His recent endoscopy was canceled due to being in atrial fibrillation rapid ventricular rate.  He is here today with his wife and daughter.  He offers no complaints at this time.  Past Medical History:  Diagnosis Date   Blood transfusion    CKD (chronic kidney disease) stage 4, GFR 15-29 ml/min (HCC)    DVT (deep venous thrombosis) (HCC)    from surgery, no longer on Eliquis   Gout    Hearing loss    Hypertension    Hypertriglyceridemia    Hypothyroid    Prediabetes    Prostate cancer [facility])    Pulmonary nodule    Tinnitus     Past Surgical History:  Procedure Laterality Date   CATARACT EXTRACTION Bilateral 2020   HERNIA REPAIR Right 2010   left inguinal hernia repair  06/29/2008   with mesh sail   PROSTATECTOMY  1998   prostate cancer   right ankle  1980   fracture   right knee  1980   torn meniscus   TOTAL KNEE ARTHROPLASTY Right 01/20/2022   Procedure: TOTAL KNEE ARTHROPLASTY;  Surgeon: Vickey Huger, MD;  Location: [facility];  Service: Orthopedics;  Laterality: Right;    Current Medications: Current Meds  Medication Sig   allopurinol (ZYLOPRIM) 100 MG tablet Take 100 mg by mouth every Monday, Wednesday, and Friday.   ALPRAZolam (XANAX) 0.5 MG tablet Take 0.5 mg by mouth 3 (three) times daily as needed for sleep (tremors).   amLODipine (NORVASC) 5 MG tablet Take 5 mg by mouth daily.   apixaban (ELIQUIS) 2.5 MG TABS tablet Take 1 tablet (2.5 mg total) by mouth 2 (two) times daily.   cholecalciferol (VITAMIN D) 1000 units tablet Take 1,000 Units by mouth daily.    levothyroxine (SYNTHROID, LEVOTHROID) 100 MCG tablet Take 100 mcg by mouth daily before breakfast.   magnesium oxide (MAG-OX) 400 MG tablet Take 400 mg by mouth daily.   metoprolol succinate (TOPROL XL) 25 MG 24 hr tablet Take 0.5 tablets (12.5 mg total) by mouth daily.   omeprazole (PRILOSEC) 40 MG capsule Open 1 capsule and sprinkle in apple sauce twice daily.     Allergies:   Patient has no known allergies.   Social History   Socioeconomic History   Marital status: Widowed    Spouse name: Not on file   Number of children: Not on file   Years of education: Not on file   Highest education level: Not on file  Occupational History   Occupation: retired Actor  Tobacco Use   Smoking status: Former   Smokeless tobacco: Not on Landscape architect Use: Never used  Substance and Sexual Activity   Alcohol use: Yes   Drug use: No   Sexual activity: Not on file  Other Topics Concern   Not on file  Social History Narrative   Not on file   Social Determinants of Health   Financial Resource Strain: Not on file  Food Insecurity: Not on file  Transportation Needs: Not  on file  Physical Activity: Not on file  Stress: Not on file  Social Connections: Not on file     Family History: The patient's family history includes Heart disease in his brother, father, and mother; Prostate cancer in his brother. There is no history of Colon cancer, Esophageal cancer, Inflammatory bowel disease, Liver disease, Pancreatic cancer, Rectal cancer, or Stomach cancer.  ROS:   Review of Systems  Constitution: Negative for decreased appetite, fever and weight gain.  HENT: Negative for congestion, ear discharge, hoarse voice and sore throat.   Eyes: Negative for discharge, redness, vision loss in right eye and visual halos.  Cardiovascular: Negative for chest pain, dyspnea on exertion, leg swelling, orthopnea and palpitations.  Respiratory: Negative for cough, hemoptysis, shortness of  breath and snoring.   Endocrine: Negative for heat intolerance and polyphagia.  Hematologic/Lymphatic: Negative for bleeding problem. Does not bruise/bleed easily.  Skin: Negative for flushing, nail changes, rash and suspicious lesions.  Musculoskeletal: Negative for arthritis, joint pain, muscle cramps, myalgias, neck pain and stiffness.  Gastrointestinal: Negative for abdominal pain, bowel incontinence, diarrhea and excessive appetite.  Genitourinary: Negative for decreased libido, genital sores and incomplete emptying.  Neurological: Negative for brief paralysis, focal weakness, headaches and loss of balance.  Psychiatric/Behavioral: Negative for altered mental status, depression and suicidal ideas.  Allergic/Immunologic: Negative for HIV exposure and persistent infections.    EKGs/Labs/Other Studies Reviewed:    The following studies were reviewed today:   EKG:  The ekg ordered today demonstrates normal sinus rhythm.   Recent Labs: 12/23/2022: ALT 7; BUN 25; Creatinine, Ser 1.90; Hemoglobin 14.0; Magnesium 2.2; Platelets 316.0; Potassium 4.4; Sodium 142; TSH 0.36  Recent Lipid Panel No results found for: "CHOL", "TRIG", "HDL", "CHOLHDL", "VLDL", "LDLCALC", "LDLDIRECT"  Physical Exam:    VS:  BP 108/70   Pulse 69   Ht 5' 5.5" (1.664 m)   Wt 62.6 kg   SpO2 98%   BMI 22.62 kg/m     Wt Readings from Last 3 Encounters:  12/24/22 62.6 kg  12/23/22 63 kg  01/20/22 69.4 kg     GEN: Well nourished, well developed in no acute distress HEENT: Normal NECK: No JVD; No carotid bruits LYMPHATICS: No lymphadenopathy CARDIAC: S1S2 noted,RRR, no murmurs, rubs, gallops RESPIRATORY:  Clear to auscultation without rales, wheezing or rhonchi  ABDOMEN: Soft, non-tender, non-distended, +bowel sounds, no guarding. EXTREMITIES: No edema, No cyanosis, no clubbing MUSCULOSKELETAL:  No deformity  SKIN: Warm and dry NEUROLOGIC:  Alert and oriented x 3, non-focal PSYCHIATRIC:  Normal affect,  good insight  ASSESSMENT:    1. PAF (paroxysmal atrial fibrillation) ([place])   2. Primary hypertension    PLAN:     He is in sinus rhythm today.  However with his known persistent atrial fibrillation I like to start the patient on Eliquis 2.5 mg twice daily.  And Toprol-XL 12.5 mg daily. Will get an echocardiogram to assess his LV function.  I like to understand his A-fib burden so I am going to place a monitor on the patient for 7 days. I discussed with his wife and daughter they are in agreement.  If he needs to proceed with his GI procedure it is okay to hold his Eliquis for 4 doses prior to the procedure and start back at least 12 hours after the procedure at the discretion of the physician performing the procedure.  The patient is in agreement with the above plan. The patient left the office in stable condition.  The patient  will follow up in 6 months or sooner if needed.   Medication Adjustments/Labs and Tests Ordered: Current medicines are reviewed at length with the patient today.  Concerns regarding medicines are outlined above.  Orders Placed This Encounter  Procedures   LONG TERM MONITOR (3-14 DAYS)   EKG 12-Lead   ECHOCARDIOGRAM COMPLETE   Meds ordered this encounter  Medications   apixaban (ELIQUIS) 2.5 MG TABS tablet    Sig: Take 1 tablet (2.5 mg total) by mouth 2 (two) times daily.    Dispense:  180 tablet    Refill:  3   metoprolol succinate (TOPROL XL) 25 MG 24 hr tablet    Sig: Take 0.5 tablets (12.5 mg total) by mouth daily.    Dispense:  45 tablet    Refill:  3    Patient Instructions  Medication Instructions:  Your physician has recommended you make the following change in your medication:  START: Eliquis 2.'5mg'$  twice daily  START: Toprol 12.'5mg'$  daily  *If you need a refill on your cardiac medications before your next appointment, please call your pharmacy*   Lab Work: NONE If you have labs (blood work) drawn today and your tests are completely  normal, you will receive your results only by: [place] (if you have MyChart) OR A paper copy in the mail If you have any lab test that is abnormal or we need to change your treatment, we will call you to review the results.   Testing/Procedures: Your physician has requested that you have an echocardiogram. Echocardiography is a painless test that uses sound waves to create images of your heart. It provides your doctor with information about the size and shape of your heart and how well your heart's chambers and valves are working. This procedure takes approximately one hour. There are no restrictions for this procedure. Please do NOT wear cologne, perfume, aftershave, or lotions (deodorant is allowed). Please arrive 15 minutes prior to your appointment time.  ZIO XT- Long Term Monitor Instructions  Your physician has requested you wear a ZIO patch monitor for 7 days.  This is a single patch monitor. [employer] supplies one patch monitor per enrollment. Additional stickers are not available. Please do not apply patch if you will be having a Nuclear Stress Test,  Echocardiogram, Cardiac CT, MRI, or Chest Xray during the period you would be wearing the  monitor. The patch cannot be worn during these tests. You cannot remove and re-apply the  ZIO XT patch monitor.  Your ZIO patch monitor will be mailed 3 day USPS to your address on file. It may take 3-5 days  to receive your monitor after you have been enrolled.  Once you have received your monitor, please review the enclosed instructions. Your monitor  has already been registered assigning a specific monitor serial # to you.  Billing and Patient Assistance Program Information  We have supplied [employer] with any of your insurance information on file for billing purposes. [employer] offers a sliding scale Patient Assistance Program for patients that do not have  insurance, or whose insurance does not completely cover the cost of the ZIO  monitor.  You must apply for the Patient Assistance Program to qualify for this discounted rate.  To apply, please call [employer] at [phone], select option 4, select option 2, ask to apply for  Patient Assistance Program. Theodore Demark will ask your household income, and how many people  are in your household. They will quote your out-of-pocket cost based on that  information.  [employer] will also be able to set up a 28-month interest-free payment plan if needed.  Applying the monitor Shave hair from upper left chest.  Hold abrader disc by orange tab. Rub abrader in 40 strokes over the upper left chest as  indicated in your monitor instructions.  Clean area with 4 enclosed alcohol pads. Let dry.  Apply patch as indicated in monitor instructions. Patch will be placed under collarbone on left  side of chest with arrow pointing upward.  Rub patch adhesive wings for 2 minutes. Remove white label marked "1". Remove the white  label marked "2". Rub patch adhesive wings for 2 additional minutes.  While looking in a mirror, press and release button in center of patch. A small green light will  flash 3-4 times. This will be your only indicator that the monitor has been turned on.  Do not shower for the first 24 hours. You may shower after the first 24 hours.  Press the button if you feel a symptom. You will hear a small click. Record Date, Time and  Symptom in the Patient Logbook.  When you are ready to remove the patch, follow instructions on the last 2 pages of Patient  Logbook. Stick patch monitor onto the last page of Patient Logbook.  Place Patient Logbook in the blue and white box. Use locking tab on box and tape box closed  securely. The blue and white box has prepaid postage on it. Please place it in the mailbox as  soon as possible. Your physician should have your test results approximately 7 days after the  monitor has been [facility]  Call [place]  [phone] you have questions regarding  your ZIO XT patch monitor. Call them immediately if you see an orange light blinking on your  monitor.  If your monitor falls off in less than 4 days, contact our Monitor department at [phone]  If your monitor becomes loose or falls off after 4 days call [employer] at [phone]  suggestions on securing your monitor    Follow-Up: [facility] and your [facility] needs are our [facility].  As part of our continuing mission to provide you with exceptional heart care, we have created designated Provider Care Teams.  These Care Teams include your primary Cardiologist (physician) and Advanced Practice Providers (APPs -  Physician Assistants and Nurse Practitioners) who all work together to provide you with the care you need, when you need it.  We recommend signing up for the patient portal called "MyChart".  Sign up information is provided on this After Visit Summary.  MyChart is used to connect with patients for Virtual Visits (Telemedicine).  Patients are able to view lab/test results, encounter notes, upcoming appointments, etc.  Non-urgent messages can be sent to your provider as well.   To learn more about what you can do with MyChart, go to [email or web address]    Your next appointment:   6 month(s)  The format for your next appointment:   In Person  Provider:   KBerniece Salines DO     Adopting a Healthy Lifestyle.  Know what a healthy weight is for you (roughly BMI <25) and aim to maintain this   Aim for 7+ servings of fruits and vegetables daily   65-80+ fluid ounces of water or unsweet tea for healthy kidneys   Limit to max 1 drink of alcohol per day; avoid smoking/tobacco   Limit animal fats in  diet for cholesterol and heart health - choose grass fed whenever available   Avoid highly processed foods, and foods high in saturated/trans fats   Aim for low stress - take time to unwind and care for your mental  health   Aim for 150 min of moderate intensity exercise weekly for heart health, and weights twice weekly for bone health   Aim for 7-9 hours of sleep daily   When it comes to diets, agreement about the perfect plan isnt easy to find, even among the experts. Experts at the [employer] developed an idea known as the Healthy Eating Plate. Just imagine a plate divided into logical, healthy portions.   The emphasis is on diet quality:   Load up on vegetables and fruits - one-half of your plate: Aim for color and variety, and remember that potatoes dont count.   Go for whole grains - one-quarter of your plate: Whole wheat, barley, wheat berries, quinoa, oats, brown rice, and foods made with them. If you want pasta, go with whole wheat pasta.   Protein power - one-quarter of your plate: Fish, chicken, beans, and nuts are all healthy, versatile protein sources. Limit red meat.   The diet, however, does go beyond the plate, offering a few other suggestions.   Use healthy plant oils, such as olive, canola, soy, corn, sunflower and peanut. Check the labels, and avoid partially hydrogenated oil, which have unhealthy trans fats.   If youre thirsty, drink water. Coffee and tea are good in moderation, but skip sugary drinks and limit milk and dairy products to one or two daily servings.   The type of carbohydrate in the diet is more important than the amount. Some sources of carbohydrates, such as vegetables, fruits, whole grains, and beans-are healthier than others.   Finally, stay active  Signed, Berniece Salines, DO  12/25/2022 8:10 AM    [place]

## 2022-12-24 NOTE — Telephone Encounter (Signed)
Request for surgical clearance:     Endoscopy Procedure  What type of surgery is being performed?     EGD with Dil   When is this surgery scheduled?     01/29/2023  What type of clearance is required ?   Pharmacy  Are there any medications that need to be held prior to surgery and how long? Eliquis x2 prior to procedure.   Practice name and name of physician performing surgery?      [facility]-Dr.Mansouraty /Daniel [name] -CMA  What is your office phone and fax number?      Phone- [phone]  [phone]  Anesthesia type (None, local, MAC, general) ?       MAC

## 2022-12-25 NOTE — Telephone Encounter (Signed)
Seen by Dr. Harriet Masson on 12/24/22, awaiting completion of note. Echo has been ordered, likely needed prior to providing clearance.

## 2022-12-26 DIAGNOSIS — K5909 Other constipation: Secondary | ICD-10-CM | POA: Insufficient documentation

## 2022-12-26 NOTE — Telephone Encounter (Signed)
   Primary Cardiologist: Berniece Salines, DO  Chart reviewed as part of pre-operative protocol coverage. Given past medical history and time since last visit, based on ACC/AHA guidelines, Daniel [name] would be at acceptable risk for the planned procedure without further cardiovascular testing.   Patient was advised that if he develops new symptoms prior to surgery to contact our office to arrange a follow-up appointment.  He verbalized understanding.  Per Dr. Harriet Masson, he may hold Eliquis for 2 days prior to procedure and should resume the day following.   I will route this recommendation to the requesting party via Epic fax function and remove from pre-op pool.  Please call with questions.  Emmaline Life, NP-C  12/26/2022, 10:28 AM [address], [address] Office [phone] Fax [phone]

## 2022-12-29 ENCOUNTER — Telehealth: Payer: Self-pay

## 2022-12-29 ENCOUNTER — Ambulatory Visit (HOSPITAL_COMMUNITY)
Admission: RE | Admit: 2022-12-29 | Discharge: 2022-12-29 | Disposition: A | Discharge status: Home / Self Care | Payer: Medicare Other | Source: Ambulatory Visit | Attending: Gastroenterology | Admitting: Gastroenterology

## 2022-12-29 DIAGNOSIS — K219 Gastro-esophageal reflux disease without esophagitis: Secondary | ICD-10-CM | POA: Diagnosis present

## 2022-12-29 DIAGNOSIS — Z862 Personal history of diseases of the blood and blood-forming organs and certain disorders involving the immune mechanism: Secondary | ICD-10-CM

## 2022-12-29 DIAGNOSIS — R1310 Dysphagia, unspecified: Secondary | ICD-10-CM | POA: Diagnosis present

## 2022-12-29 DIAGNOSIS — R634 Abnormal weight loss: Secondary | ICD-10-CM

## 2022-12-29 DIAGNOSIS — I4891 Unspecified atrial fibrillation: Secondary | ICD-10-CM

## 2022-12-29 NOTE — Telephone Encounter (Signed)
I called and spoke with the patient's daughter and went over the results of the barium swallow. They are aware of our concern that a potential underlying malignancy may be at play more than just stricturing disease from acid reflux changes. We are going to work on trying to expedite this EGD hopefully for next week based on my availability and anesthesia availability at the hospital. We will have a better understanding of availability for next Monday versus next Thursday within the next 24 hours. Thanks. GM

## 2022-12-29 NOTE — Telephone Encounter (Signed)
Received call from [facility] with [facility] stating patient's swallow study has resulted & MD will need to review impression #1. Will route to MD.

## 2022-12-30 ENCOUNTER — Other Ambulatory Visit: Payer: Self-pay

## 2022-12-30 DIAGNOSIS — K219 Gastro-esophageal reflux disease without esophagitis: Secondary | ICD-10-CM

## 2022-12-30 DIAGNOSIS — R1310 Dysphagia, unspecified: Secondary | ICD-10-CM

## 2022-12-30 DIAGNOSIS — R634 Abnormal weight loss: Secondary | ICD-10-CM

## 2022-12-30 NOTE — Telephone Encounter (Signed)
Thanks team

## 2022-12-30 NOTE — Telephone Encounter (Signed)
Daniel [name], Daniel [name] and I had discussed this.  I think we are going to look towards next Thursday as my last case after some changes in the times for other procedures that day.  Thanks. GM

## 2022-12-30 NOTE — Telephone Encounter (Signed)
Dr Rush Landmark do you have a time frame you would like this pt scheduled? Monday or Thursday?

## 2022-12-30 NOTE — Telephone Encounter (Signed)
Patient has been scheduled for 01/08/23 @ [facility].

## 2023-01-01 NOTE — Progress Notes (Signed)
See alternate phone note dated 1/18

## 2023-01-01 NOTE — Telephone Encounter (Signed)
The pt has been advised of the hold

## 2023-01-01 NOTE — Telephone Encounter (Signed)
Mansouraty, Telford Nab., MD  Berniece Salines, DO; [place], [place], CMA KT, Thank so much for seeing him so quickly. We will put the notation in our records in regards to the timing of Eliquis hold. We will look forward to seeing what the echocardiogram shows as well. GM  FYI [place], we only need to hold Eliquis for 4 total doses prior to an EGD. Thanks. GM

## 2023-01-05 ENCOUNTER — Other Ambulatory Visit: Payer: Self-pay

## 2023-01-05 ENCOUNTER — Other Ambulatory Visit: Payer: Self-pay | Admitting: Gastroenterology

## 2023-01-05 ENCOUNTER — Ambulatory Visit (HOSPITAL_COMMUNITY): Payer: Medicare Other | Admitting: Anesthesiology

## 2023-01-05 ENCOUNTER — Encounter (HOSPITAL_COMMUNITY)
Admission: RE | Disposition: A | Discharge status: Home / Self Care | Payer: Self-pay | Source: Home / Self Care | Attending: Gastroenterology

## 2023-01-05 ENCOUNTER — Encounter (HOSPITAL_COMMUNITY): Payer: Self-pay | Admitting: Gastroenterology

## 2023-01-05 ENCOUNTER — Ambulatory Visit (HOSPITAL_BASED_OUTPATIENT_CLINIC_OR_DEPARTMENT_OTHER): Payer: Medicare Other | Admitting: Anesthesiology

## 2023-01-05 ENCOUNTER — Telehealth: Payer: Self-pay

## 2023-01-05 ENCOUNTER — Ambulatory Visit (HOSPITAL_COMMUNITY)
Admission: RE | Admit: 2023-01-05 | Discharge: 2023-01-05 | Disposition: A | Discharge status: Home / Self Care | Payer: Medicare Other | Attending: Gastroenterology | Admitting: Gastroenterology

## 2023-01-05 DIAGNOSIS — J449 Chronic obstructive pulmonary disease, unspecified: Secondary | ICD-10-CM | POA: Insufficient documentation

## 2023-01-05 DIAGNOSIS — K2289 Other specified disease of esophagus: Secondary | ICD-10-CM

## 2023-01-05 DIAGNOSIS — R933 Abnormal findings on diagnostic imaging of other parts of digestive tract: Secondary | ICD-10-CM | POA: Diagnosis not present

## 2023-01-05 DIAGNOSIS — W44F3XA Food entering into or through a natural orifice, initial encounter: Secondary | ICD-10-CM | POA: Insufficient documentation

## 2023-01-05 DIAGNOSIS — Z87891 Personal history of nicotine dependence: Secondary | ICD-10-CM

## 2023-01-05 DIAGNOSIS — I129 Hypertensive chronic kidney disease with stage 1 through stage 4 chronic kidney disease, or unspecified chronic kidney disease: Secondary | ICD-10-CM

## 2023-01-05 DIAGNOSIS — N184 Chronic kidney disease, stage 4 (severe): Secondary | ICD-10-CM | POA: Diagnosis not present

## 2023-01-05 DIAGNOSIS — I4891 Unspecified atrial fibrillation: Secondary | ICD-10-CM

## 2023-01-05 DIAGNOSIS — K449 Diaphragmatic hernia without obstruction or gangrene: Secondary | ICD-10-CM

## 2023-01-05 DIAGNOSIS — D490 Neoplasm of unspecified behavior of digestive system: Secondary | ICD-10-CM

## 2023-01-05 DIAGNOSIS — T18128A Food in esophagus causing other injury, initial encounter: Secondary | ICD-10-CM | POA: Insufficient documentation

## 2023-01-05 DIAGNOSIS — R7303 Prediabetes: Secondary | ICD-10-CM | POA: Diagnosis not present

## 2023-01-05 DIAGNOSIS — E039 Hypothyroidism, unspecified: Secondary | ICD-10-CM | POA: Diagnosis not present

## 2023-01-05 DIAGNOSIS — Z8546 Personal history of malignant neoplasm of prostate: Secondary | ICD-10-CM | POA: Insufficient documentation

## 2023-01-05 DIAGNOSIS — K5909 Other constipation: Secondary | ICD-10-CM | POA: Diagnosis not present

## 2023-01-05 DIAGNOSIS — R634 Abnormal weight loss: Secondary | ICD-10-CM

## 2023-01-05 DIAGNOSIS — Z862 Personal history of diseases of the blood and blood-forming organs and certain disorders involving the immune mechanism: Secondary | ICD-10-CM

## 2023-01-05 DIAGNOSIS — R1310 Dysphagia, unspecified: Secondary | ICD-10-CM

## 2023-01-05 DIAGNOSIS — K219 Gastro-esophageal reflux disease without esophagitis: Secondary | ICD-10-CM | POA: Diagnosis not present

## 2023-01-05 DIAGNOSIS — I480 Paroxysmal atrial fibrillation: Secondary | ICD-10-CM | POA: Diagnosis not present

## 2023-01-05 DIAGNOSIS — E781 Pure hyperglyceridemia: Secondary | ICD-10-CM | POA: Insufficient documentation

## 2023-01-05 DIAGNOSIS — Z8249 Family history of ischemic heart disease and other diseases of the circulatory system: Secondary | ICD-10-CM | POA: Diagnosis not present

## 2023-01-05 DIAGNOSIS — K2970 Gastritis, unspecified, without bleeding: Secondary | ICD-10-CM

## 2023-01-05 DIAGNOSIS — K2950 Unspecified chronic gastritis without bleeding: Secondary | ICD-10-CM | POA: Diagnosis not present

## 2023-01-05 DIAGNOSIS — N189 Chronic kidney disease, unspecified: Secondary | ICD-10-CM

## 2023-01-05 HISTORY — PX: FOREIGN BODY REMOVAL: SHX962

## 2023-01-05 HISTORY — PX: ESOPHAGOGASTRODUODENOSCOPY (EGD) WITH PROPOFOL: SHX5813

## 2023-01-05 HISTORY — PX: BIOPSY: SHX5522

## 2023-01-05 SURGERY — ESOPHAGOGASTRODUODENOSCOPY (EGD) WITH PROPOFOL
Anesthesia: Monitor Anesthesia Care

## 2023-01-05 MED ORDER — PROPOFOL 500 MG/50ML IV EMUL
INTRAVENOUS | Status: AC
  Filled 2023-01-05: qty 50

## 2023-01-05 MED ORDER — APIXABAN 2.5 MG PO TABS: 2.5000 mg | ORAL_TABLET | Freq: Two times a day (BID) | ORAL | 3 refills | Status: DC

## 2023-01-05 MED ORDER — PROPOFOL 500 MG/50ML IV EMUL
INTRAVENOUS | Status: DC | PRN
  Administered 2023-01-05: 100 ug/kg/min via INTRAVENOUS

## 2023-01-05 MED ORDER — SUCRALFATE 1 GM/10ML PO SUSP: 1.0000 g | Freq: Two times a day (BID) | ORAL | 1 refills | Status: DC

## 2023-01-05 MED ORDER — PHENYLEPHRINE 80 MCG/ML (10ML) SYRINGE FOR IV PUSH (FOR BLOOD PRESSURE SUPPORT)
PREFILLED_SYRINGE | INTRAVENOUS | Status: DC | PRN
  Administered 2023-01-05: 160 ug via INTRAVENOUS
  Administered 2023-01-05: 80 ug via INTRAVENOUS

## 2023-01-05 MED ORDER — SODIUM CHLORIDE 0.9 % IV SOLN: INTRAVENOUS | Status: DC

## 2023-01-05 MED ORDER — PROPOFOL 10 MG/ML IV BOLUS
INTRAVENOUS | Status: DC | PRN
  Administered 2023-01-05 (×2): 20 mg via INTRAVENOUS

## 2023-01-05 SURGICAL SUPPLY — 15 items

## 2023-01-05 NOTE — Op Note (Signed)
[facility] Patient Name: Daniel [name] Procedure Date: 01/05/2023 MRN: [id number] Attending MD: Justice Britain , MD, [id number] Date of Birth: 1927/03/22 CSN: [id number] Age: 89 Admit Type: Outpatient Procedure:                Upper GI endoscopy Indications:              Dysphagia, Abnormal cine-esophagram, Nausea with                            vomiting, Regurgitation, Weight loss Providers:                Justice Britain, MD, Fanny Skates RN, RN,                            William Dalton, Technician Referring MD:             Nelly Laurence Medicines:                Monitored Anesthesia Care Complications:            No immediate complications. Estimated Blood Loss:     Estimated blood loss was minimal. Procedure:                Pre-Anesthesia Assessment:                           - Prior to the procedure, a History and Physical                            was performed, and patient medications and                            allergies were reviewed. The patient's tolerance of                            previous anesthesia was also reviewed. The risks                            and benefits of the procedure and the sedation                            options and risks were discussed with the patient.                            All questions were answered, and informed consent                            was obtained. Prior Anticoagulants: The patient has                            taken Eliquis (apixaban), last dose was 2 days                            prior to procedure. ASA Grade Assessment: III - A  patient with severe systemic disease. After                            reviewing the risks and benefits, the patient was                            deemed in satisfactory condition to undergo the                            procedure.                           After obtaining informed consent, the endoscope was                             passed under direct vision. Throughout the                            procedure, the patient's blood pressure, pulse, and                            oxygen saturations were monitored continuously. The                            GIF-H190 ([id number]) Olympus endoscope was introduced                            through the mouth, and advanced to the lower third                            of esophagus. The GIF-[id number] ([id number]) Olympus slim                            endoscope was introduced through the mouth, and                            advanced to the second part of duodenum. The upper                            GI endoscopy was accomplished without difficulty.                            The patient tolerated the procedure. Scope In: Scope Out: Findings:      Food was found in the entire esophagus. Removal was accomplished with a       Roth net.      A medium-sized, fungating and ulcerating mass with bleeding and stigmata       of recent bleeding was found in the distal esophagus, 38 cm from the       incisors. The mass was completely obstructing and circumferential       (involving 100% of the lumen circumference). We were only able to       traverse the mass/stenosis by transitioning to the neonatal endoscope       (which still required some gentle pressure to traverse the  strictured       area; thus approximately 5 to 6 mm in size for the overall narrowing).       Biopsies were taken with a cold forceps for histology.      A medium-sized hiatal hernia was present.      Patchy moderate inflammation characterized by erosions, erythema and       granularity was found in the entire examined stomach. Biopsies were       taken with a cold forceps for histology and Helicobacter pylori testing.      No gross lesions were noted in the duodenal bulb, in the first portion       of the duodenum and in the second portion of the duodenum. Impression:               - Food in the esophagus. Removal was  successful.                           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                            Biopsied.                           - Medium-sized hiatal hernia.                           - Gastritis. Biopsied.                           - No gross lesions in the duodenal bulb, in the                            first portion of the duodenum and in the second                            portion of the duodenum. Moderate Sedation:      Not Applicable - Patient had care per Anesthesia. Recommendation:           - The patient will be observed post-procedure,                            until all discharge criteria are met.                           - Discharge patient to home.                           - Patient has a contact number available for                            emergencies.  The signs and symptoms of potential                            delayed complications were discussed with the                            patient. Return to normal activities tomorrow.                            Written discharge instructions were provided to the                            patient.                           - Full liquid diet, will be the only things that we                            will likely be able to pass the 5 to 6 mm stenosis.                           - Continue Omeprazole twice daily as you are doing                            by opening up the capsules and putting in                            applesauce.                           - Carafate twice daily liquid slurry.                           - Observe patient's clinical course.                           - Await pathology results.                           - Proceed with ordering CT chest/abdomen/pelvis                            with IV and oral contrast if able to  tolerate in                            setting of likely underlying esophageal malignancy.                           - Based on what is likely an underlying malignancy,                            expedited referral to oncology and radiation                            oncology  will be made as soon as pathology is back.                           - At patient's age and other medical comorbidities,                            not clear that he would be a surgical candidate                            even if this was surgically resectable. The                            inability to traverse the area, makes this most                            likely a T3 lesion (pending pathology).                           - This patient's underlying nutritional status will                            best be served with a PEG tube being placed for                            nutritional support if interventions of chemo and                            radiation are not to be planned soon. Potential for                            stenting may be present pending patient's overall                            clinical status (query 15 mm length AXIOS LAMS                            versus typical PCSEMS vs FCSEMS esophageal stent)                            pending timing of interventions or if treatment                            does not help dysphagia.                           - May restart Eliquis in 2 days (01/07/23 PM) to                            decrease post interventional bleeding risk.                           - The findings and recommendations were discussed  with the patient.                           - The findings and recommendations were discussed                            with the patient's family. Procedure Code(s):        --- Professional ---                           [phone], Esophagogastroduodenoscopy, flexible,                            transoral; with removal of  foreign body(s)                           43239, Esophagogastroduodenoscopy, flexible,                            transoral; with biopsy, single or multiple Diagnosis Code(s):        --- Professional ---                           Z99.357S, Food in esophagus causing other injury,                            initial encounter                           D49.0, Neoplasm of unspecified behavior of                            digestive system                           K44.9, Diaphragmatic hernia without obstruction or                            gangrene                           K29.70, Gastritis, unspecified, without bleeding                           R13.10, Dysphagia, unspecified                           R11.2, Nausea with vomiting, unspecified                           R11.10, Vomiting, unspecified                           R63.4, Abnormal weight loss                           R93.3, Abnormal findings on diagnostic imaging of  other parts of digestive tract CPT copyright 2022 American [employer] Association. All rights reserved. The codes documented in this report are preliminary and upon coder review may  be revised to meet current compliance requirements. Justice Britain, MD 01/05/2023 2:52:47 PM Number of Addenda: 0

## 2023-01-05 NOTE — Telephone Encounter (Signed)
You have been scheduled for a CT scan of the abdomen, pelvis  and chest at [facility]. Check in at [facility]. You are scheduled on 01/07/23 at 11:00 am . You should arrive at 8:45 am for registration and to drink contrast.     Please follow the written instructions below on the day of your exam:   1) Do not eat anything after midnight the night prior to your CT scan.   You may take any medications as prescribed with a small amount of water, if necessary. If you take any of the following medications: METFORMIN, GLUCOPHAGE, GLUCOVANCE, AVANDAMET, RIOMET, FORTAMET, [place] MET, JANUMET, GLUMETZA or METAGLIP, you MAY be asked to HOLD this medication 48 hours AFTER the exam.   The purpose of you drinking the oral contrast is to aid in the visualization of your intestinal tract. The contrast solution may cause some diarrhea. Depending on your individual set of symptoms, you may also receive an intravenous injection of x-ray contrast/dye. Plan on being at [facility] for 45 minutes or longer, depending on the type of exam you are having performed.   If you have any questions regarding your exam or if you need to reschedule, you may call [facility] at [phone] between the hours of 8:00 am and 5:00 pm, Monday-Friday.

## 2023-01-05 NOTE — Anesthesia Postprocedure Evaluation (Signed)
Anesthesia Post Note  Patient: Daniel [name]  Procedure(s) Performed: ESOPHAGOGASTRODUODENOSCOPY (EGD) WITH PROPOFOL BIOPSY FOREIGN BODY REMOVAL     Patient location during evaluation: Endoscopy Anesthesia Type: MAC Level of consciousness: awake and alert Pain management: pain level controlled Vital Signs Assessment: post-procedure vital signs reviewed and stable Respiratory status: spontaneous breathing and respiratory function stable Cardiovascular status: stable Postop Assessment: no apparent nausea or vomiting Anesthetic complications: no  No notable events documented.  Last Vitals:  Vitals:   01/05/23 1510 01/05/23 1520  BP: (!) 154/59 (!) 140/63  Pulse: 66 71  Resp: (!) 21 14  Temp:    SpO2: 100% 100%    Last Pain:  Vitals:   01/05/23 1520  TempSrc:   PainSc: 0-No pain                 [name]

## 2023-01-05 NOTE — Interval H&P Note (Signed)
History and Physical Interval Note:  01/05/2023 1:19 PM  [name]  has presented today for surgery, with the diagnosis of Dysphagia, Weight Loss, Daniel [name].  The various methods of treatment have been discussed with the patient and family. After consideration of risks, benefits and other options for treatment, the patient has consented to  Procedure(s): ESOPHAGOGASTRODUODENOSCOPY (EGD) WITH PROPOFOL (N/A) as a surgical intervention.  The patient's history has been reviewed, patient examined, no change in status, stable for surgery.  I have reviewed the patient's chart and labs.  Questions were answered to the patient's satisfaction.     [employer]

## 2023-01-05 NOTE — Anesthesia Preprocedure Evaluation (Signed)
Anesthesia Evaluation  Patient identified by MRN, date of birth, ID band Patient awake    Reviewed: Allergy & Precautions, NPO status , Patient's Chart, lab work & pertinent test results  Airway Mallampati: II  TM Distance: >3 FB     Dental   Pulmonary COPD, former smoker   breath sounds clear to auscultation       Cardiovascular hypertension, Pt. on medications and Pt. on home beta blockers + dysrhythmias Atrial Fibrillation  Rhythm:Regular Rate:Normal     Neuro/Psych negative neurological ROS     GI/Hepatic Neg liver ROS,GERD  ,,  Endo/Other  Hypothyroidism    Renal/GU CRFRenal disease     Musculoskeletal   Abdominal   Peds  Hematology negative hematology ROS (+)   Anesthesia Other Findings   Reproductive/Obstetrics                             Anesthesia Physical Anesthesia Plan  ASA: 3  Anesthesia Plan: MAC   Post-op Pain Management:    Induction:   PONV Risk Score and Plan: 1 and Propofol infusion  Airway Management Planned: Natural Airway and Nasal Cannula  Additional Equipment:   Intra-op Plan:   Post-operative Plan:   Informed Consent: I have reviewed the patients History and Physical, chart, labs and discussed the procedure including the risks, benefits and alternatives for the proposed anesthesia with the patient or authorized representative who has indicated his/her understanding and acceptance.       Plan Discussed with:   Anesthesia Plan Comments:        Anesthesia Quick Evaluation

## 2023-01-05 NOTE — Discharge Instructions (Signed)
YOU HAD AN ENDOSCOPIC PROCEDURE TODAY: Refer to the procedure report and other information in the discharge instructions given to you for any specific questions about what was found during the examination. If this information does not answer your questions, please call [facility] office at [phone] to clarify.  ° °YOU SHOULD EXPECT: Some feelings of bloating in the abdomen. Passage of more gas than usual. Walking can help get rid of the air that was put into your GI tract during the procedure and reduce the bloating. If you had a lower endoscopy (such as a colonoscopy or flexible sigmoidoscopy) you may notice spotting of blood in your stool or on the toilet paper. Some abdominal soreness may be present for a day or two, also. ° °DIET: Your first meal following the procedure should be a light meal and then it is ok to progress to your normal diet. A half-sandwich or bowl of soup is an example of a good first meal. Heavy or fried foods are harder to digest and may make you feel nauseous or bloated. Drink plenty of fluids but you should avoid alcoholic beverages for 24 hours. If you had a esophageal dilation, please see attached instructions for diet.   ° °ACTIVITY: Your care partner should take you home directly after the procedure. You should plan to take it easy, moving slowly for the rest of the day. You can resume normal activity the day after the procedure however YOU SHOULD NOT DRIVE, use power tools, machinery or perform tasks that involve climbing or major physical exertion for 24 hours (because of the sedation medicines used during the test).  ° °SYMPTOMS TO REPORT IMMEDIATELY: °A gastroenterologist can be reached at any hour. Please call [phone]  for any of the following symptoms:  °Following lower endoscopy (colonoscopy, flexible sigmoidoscopy) °Excessive amounts of blood in the stool  °Significant tenderness, worsening of abdominal pains  °Swelling of the abdomen that is new, acute  °Fever of 100° or  higher  °Following upper endoscopy (EGD, EUS, ERCP, esophageal dilation) °Vomiting of blood or coffee ground material  °New, significant abdominal pain  °New, significant chest pain or pain under the shoulder blades  °Painful or persistently difficult swallowing  °New shortness of breath  °Black, tarry-looking or red, bloody stools ° °FOLLOW UP:  °If any biopsies were taken you will be contacted by phone or by letter within the next 1-3 weeks. Call [phone]  if you have not heard about the biopsies in 3 weeks.  °Please also call with any specific questions about appointments or follow up tests. ° °

## 2023-01-05 NOTE — Transfer of Care (Signed)
Immediate Anesthesia Transfer of Care Note  Patient: Daniel [name]  Procedure(s) Performed: ESOPHAGOGASTRODUODENOSCOPY (EGD) WITH PROPOFOL BIOPSY FOREIGN BODY REMOVAL  Patient Location: PACU and Endoscopy Unit  Anesthesia Type:MAC  Level of Consciousness: awake and alert   Airway & Oxygen Therapy: Patient Spontanous Breathing and Patient connected to nasal cannula oxygen  Post-op Assessment: Report given to RN, Post -op Vital signs reviewed and stable, and BP 128/104  Post vital signs: Reviewed and stable  Last Vitals:  Vitals Value Taken Time  BP    Temp    Pulse 75 01/05/23 1448  Resp 16 01/05/23 1448  SpO2 97 % 01/05/23 1448  Vitals shown include unvalidated device data.  Last Pain:  Vitals:   01/05/23 1225  TempSrc: Temporal  PainSc: 0-No pain         Complications: No notable events documented.

## 2023-01-05 NOTE — Anesthesia Procedure Notes (Signed)
Procedure Name: MAC Date/Time: 01/05/2023 2:07 PM  Performed by: Lollie Sails, CRNAPre-anesthesia Checklist: Patient identified, Emergency Drugs available, Suction available, Patient being monitored and Timeout performed Oxygen Delivery Method: Nasal cannula Placement Confirmation: positive ETCO2

## 2023-01-06 ENCOUNTER — Encounter (HOSPITAL_COMMUNITY): Payer: Self-pay | Admitting: Gastroenterology

## 2023-01-07 ENCOUNTER — Other Ambulatory Visit: Payer: Self-pay | Admitting: Gastroenterology

## 2023-01-07 ENCOUNTER — Other Ambulatory Visit: Payer: Self-pay

## 2023-01-07 ENCOUNTER — Other Ambulatory Visit: Payer: Medicare Other

## 2023-01-07 ENCOUNTER — Ambulatory Visit (HOSPITAL_COMMUNITY)
Admission: RE | Admit: 2023-01-07 | Discharge: 2023-01-07 | Disposition: A | Discharge status: Home / Self Care | Payer: Medicare Other | Source: Ambulatory Visit | Attending: Gastroenterology | Admitting: Gastroenterology

## 2023-01-07 DIAGNOSIS — C159 Malignant neoplasm of esophagus, unspecified: Secondary | ICD-10-CM

## 2023-01-07 DIAGNOSIS — K222 Esophageal obstruction: Secondary | ICD-10-CM | POA: Diagnosis not present

## 2023-01-07 DIAGNOSIS — R911 Solitary pulmonary nodule: Secondary | ICD-10-CM | POA: Diagnosis not present

## 2023-01-07 DIAGNOSIS — N200 Calculus of kidney: Secondary | ICD-10-CM | POA: Diagnosis not present

## 2023-01-07 DIAGNOSIS — I700 Atherosclerosis of aorta: Secondary | ICD-10-CM | POA: Insufficient documentation

## 2023-01-07 DIAGNOSIS — K449 Diaphragmatic hernia without obstruction or gangrene: Secondary | ICD-10-CM | POA: Diagnosis not present

## 2023-01-07 DIAGNOSIS — I2510 Atherosclerotic heart disease of native coronary artery without angina pectoris: Secondary | ICD-10-CM | POA: Diagnosis not present

## 2023-01-07 DIAGNOSIS — K2289 Other specified disease of esophagus: Secondary | ICD-10-CM | POA: Diagnosis present

## 2023-01-07 DIAGNOSIS — R1310 Dysphagia, unspecified: Secondary | ICD-10-CM

## 2023-01-08 ENCOUNTER — Other Ambulatory Visit: Payer: Self-pay

## 2023-01-08 DIAGNOSIS — C159 Malignant neoplasm of esophagus, unspecified: Secondary | ICD-10-CM

## 2023-01-08 NOTE — Progress Notes (Signed)
I spoke with Daniel [name] daughter, Daniel [name].  I offered consult appts with Dr Burr Medico either 1/25 at 1530 or 1/26 at 1530.  She said her father is having some work done at his house today and is unsure if he can come to either of those appts.  She will call me after she speaks with him.  I told her we are concerned regarding his nutritional status and he may need a feeding tube.  Daniel [name] states she doesn't think he will want a feeding tube.  She is not sure he will want any treatment.  They thought he could just have surgery.  I explained standard care is for concurrent chemotherapy and radiation therapy not surgery. I told her we should still see him and discuss his options. I told her if he does decide he doesn't want treatment we can still provide him care via palliative care and referral to hospice.  All questions were answered.  She verbalized understanding.

## 2023-01-09 ENCOUNTER — Other Ambulatory Visit: Payer: Self-pay

## 2023-01-09 ENCOUNTER — Inpatient Hospital Stay (HOSPITAL_BASED_OUTPATIENT_CLINIC_OR_DEPARTMENT_OTHER): Payer: Medicare Other | Admitting: Hematology

## 2023-01-09 ENCOUNTER — Inpatient Hospital Stay: Payer: Medicare Other | Attending: Physician Assistant

## 2023-01-09 ENCOUNTER — Encounter: Payer: Self-pay | Admitting: Hematology

## 2023-01-09 ENCOUNTER — Encounter: Payer: Self-pay | Admitting: Gastroenterology

## 2023-01-09 VITALS — Temp 98.6°F | Resp 17 | Wt 130.7 lb

## 2023-01-09 DIAGNOSIS — I129 Hypertensive chronic kidney disease with stage 1 through stage 4 chronic kidney disease, or unspecified chronic kidney disease: Secondary | ICD-10-CM | POA: Diagnosis not present

## 2023-01-09 DIAGNOSIS — Z87891 Personal history of nicotine dependence: Secondary | ICD-10-CM | POA: Diagnosis not present

## 2023-01-09 DIAGNOSIS — M47816 Spondylosis without myelopathy or radiculopathy, lumbar region: Secondary | ICD-10-CM | POA: Insufficient documentation

## 2023-01-09 DIAGNOSIS — K222 Esophageal obstruction: Secondary | ICD-10-CM | POA: Insufficient documentation

## 2023-01-09 DIAGNOSIS — Z7901 Long term (current) use of anticoagulants: Secondary | ICD-10-CM | POA: Diagnosis not present

## 2023-01-09 DIAGNOSIS — C155 Malignant neoplasm of lower third of esophagus: Secondary | ICD-10-CM | POA: Diagnosis not present

## 2023-01-09 DIAGNOSIS — R5383 Other fatigue: Secondary | ICD-10-CM | POA: Insufficient documentation

## 2023-01-09 DIAGNOSIS — Z923 Personal history of irradiation: Secondary | ICD-10-CM | POA: Diagnosis not present

## 2023-01-09 DIAGNOSIS — N200 Calculus of kidney: Secondary | ICD-10-CM | POA: Diagnosis not present

## 2023-01-09 DIAGNOSIS — K828 Other specified diseases of gallbladder: Secondary | ICD-10-CM | POA: Insufficient documentation

## 2023-01-09 DIAGNOSIS — R1319 Other dysphagia: Secondary | ICD-10-CM | POA: Diagnosis not present

## 2023-01-09 DIAGNOSIS — C159 Malignant neoplasm of esophagus, unspecified: Secondary | ICD-10-CM

## 2023-01-09 DIAGNOSIS — N184 Chronic kidney disease, stage 4 (severe): Secondary | ICD-10-CM

## 2023-01-09 DIAGNOSIS — M5136 Other intervertebral disc degeneration, lumbar region: Secondary | ICD-10-CM | POA: Diagnosis not present

## 2023-01-09 DIAGNOSIS — K449 Diaphragmatic hernia without obstruction or gangrene: Secondary | ICD-10-CM | POA: Insufficient documentation

## 2023-01-09 DIAGNOSIS — E440 Moderate protein-calorie malnutrition: Secondary | ICD-10-CM | POA: Diagnosis not present

## 2023-01-09 DIAGNOSIS — E039 Hypothyroidism, unspecified: Secondary | ICD-10-CM | POA: Diagnosis not present

## 2023-01-09 DIAGNOSIS — E781 Pure hyperglyceridemia: Secondary | ICD-10-CM | POA: Diagnosis not present

## 2023-01-09 DIAGNOSIS — Z8546 Personal history of malignant neoplasm of prostate: Secondary | ICD-10-CM

## 2023-01-09 DIAGNOSIS — Z803 Family history of malignant neoplasm of breast: Secondary | ICD-10-CM | POA: Insufficient documentation

## 2023-01-09 DIAGNOSIS — Z79899 Other long term (current) drug therapy: Secondary | ICD-10-CM | POA: Insufficient documentation

## 2023-01-09 DIAGNOSIS — C9000 Multiple myeloma not having achieved remission: Secondary | ICD-10-CM | POA: Insufficient documentation

## 2023-01-09 DIAGNOSIS — K219 Gastro-esophageal reflux disease without esophagitis: Secondary | ICD-10-CM | POA: Insufficient documentation

## 2023-01-09 LAB — CMP (CANCER CENTER ONLY)
ALT: 13 U/L (ref 0–44)
AST: 23 U/L (ref 15–41)
Albumin: 3.5 g/dL (ref 3.5–5.0)
Alkaline Phosphatase: 74 U/L (ref 38–126)
Anion gap: 5 (ref 5–15)
BUN: 42 mg/dL — ABNORMAL HIGH (ref 8–23)
CO2: 29 mmol/L (ref 22–32)
Calcium: 9.6 mg/dL (ref 8.9–10.3)
Chloride: 107 mmol/L (ref 98–111)
Creatinine: 2.03 mg/dL — ABNORMAL HIGH (ref 0.61–1.24)
GFR, Estimated: 30 mL/min — ABNORMAL LOW (ref >60)
Glucose, Bld: 164 mg/dL — ABNORMAL HIGH (ref 70–99)
Potassium: 4.7 mmol/L (ref 3.5–5.1)
Sodium: 141 mmol/L (ref 135–145)
Total Bilirubin: 0.5 mg/dL (ref 0.3–1.2)
Total Protein: 5.8 g/dL — ABNORMAL LOW (ref 6.5–8.1)

## 2023-01-09 LAB — CBC WITH DIFFERENTIAL (CANCER CENTER ONLY)
Abs Immature Granulocytes: 0.03 10*3/uL (ref 0.00–0.07)
Basophils Absolute: 0 10*3/uL (ref 0.0–0.1)
Basophils Relative: 0 %
Eosinophils Absolute: 0.2 10*3/uL (ref 0.0–0.5)
Eosinophils Relative: 2 %
HCT: 39.6 % (ref 39.0–52.0)
Hemoglobin: 13.9 g/dL (ref 13.0–17.0)
Immature Granulocytes: 0 %
Lymphocytes Relative: 21 %
Lymphs Abs: 1.9 10*3/uL (ref 0.7–4.0)
MCH: 35.4 pg — ABNORMAL HIGH (ref 26.0–34.0)
MCHC: 35.1 g/dL (ref 30.0–36.0)
MCV: 100.8 fL — ABNORMAL HIGH (ref 80.0–100.0)
Monocytes Absolute: 0.6 10*3/uL (ref 0.1–1.0)
Monocytes Relative: 6 %
Neutro Abs: 6.3 10*3/uL (ref 1.7–7.7)
Neutrophils Relative %: 71 %
Platelet Count: 217 10*3/uL (ref 150–400)
RBC: 3.93 MIL/uL — ABNORMAL LOW (ref 4.22–5.81)
RDW: 12.3 % (ref 11.5–15.5)
WBC Count: 9 10*3/uL (ref 4.0–10.5)
nRBC: 0 % (ref 0.0–0.2)

## 2023-01-09 NOTE — Assessment & Plan Note (Signed)
-  Secondary to esophageal cancer -I recommend gastric feeding tube placement -Will reach out to her to Dr. Rush Landmark to see if we can place it, if not we will ask IR to do it

## 2023-01-09 NOTE — Progress Notes (Signed)
I met with Daniel [name], his daughter, and his son after  his consultation with Dr Burr Medico.  I explained my role as a nurse navigator and provided my contact information. I explained the services provided at [facility] and provided written information.  I explained the alight grant and let  them know one of the financial advisors will reach out to him.   All questions were answered. They verbalized understanding.

## 2023-01-09 NOTE — Assessment & Plan Note (Signed)
-  Presented with worsening dysphagia, he is on liquid diet only now. -I reviewed his EGD findings and the biopsy results.  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 -Staging CT showed no evidence of distant metastasis, I personally reviewed and discussed with patient and his children, including incidental findings. -I recommend a PET scan for staging and radiation planning. -I discussed treatment options, including radiation, chemotherapy, and surgery.  Given his advanced age, I do not think he is a good candidate for chemotherapy or surgery.  I recommend him to consider radiation, with the goal to improve his quality of life and hopefully he can eat food again. Pt agree with the plan -I will request MMR, HER2 and PD-L1 testing on his biopsy, to see if he is a candidate for immunotherapy or targeted therapy. -Will present his case in our GI conference next week.

## 2023-01-09 NOTE — Progress Notes (Signed)
[place]   [phone] Fax:([phone]) [place] Note   Patient Care Team: Nelly Laurence, NP as PCP - General (Family Medicine) Berniece Salines, DO as PCP - Cardiology (Cardiology)  Date of Service:  01/09/2023   CHIEF COMPLAINTS/PURPOSE OF CONSULTATION:  Esophageal Cancer  REFERRING PHYSICIAN:  Mansouraty, Valarie Merino Jr.,MD  ASSESSMENT & PLAN:  Daniel [name] is a 89 y.o.  male with a history of Esophageal Cancer  Esophageal cancer ([place]) -Presented with worsening dysphagia, he is on liquid diet only now. -I reviewed his EGD findings and the biopsy results.  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 -Staging CT showed no evidence of distant metastasis, I personally reviewed and discussed with patient and his children, including incidental findings. -I recommend a PET scan for staging and radiation planning. -I discussed treatment options, including radiation, chemotherapy, and surgery.  Given his advanced age, I do not think he is a good candidate for chemotherapy or surgery.  I recommend him to consider radiation, with the goal to improve his quality of life and hopefully he can eat food again. Pt agree with the plan -I will request MMR, HER2 and PD-L1 testing on his biopsy, to see if he is a candidate for immunotherapy or targeted therapy. -Will present his case in our GI conference next week.  Dysphagia -Secondary to esophageal cancer -I recommend gastric feeding tube placement -Will reach out to her to Dr. Rush Landmark to see if we can place it, if not we will ask IR to do it  Moderate protein and calorie malnutrition -Secondary to dysphagia, he is on boost 3 bottles a day -I recommend a feeding tube placement  PLAN:  - Discuss the CT scan and EGD, and the biopsy results - Discuss treatment option. -I recommend radiation therapy alone  - Discuss Feeding tube with Pt, he  will think about it -He is scheduled to see radiation oncologist Dr. 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 Beta-2 micro: 5.9 Normal immunoglogulin heavy chains Hgb 11.4 Creatinine 2.08 No hypercalcemia   09/09/2018 Imaging   Metastatic skeletal survey negative   09/13/2018 Bone Marrow Biopsy   Morphology (Accession: [id number]): Bone Marrow, Aspirate,Biopsy, and Clot - HYPERCELLULAR BONE MARROW WITH PLASMA CELL NEOPLASM (12% cells, IHC showed lambda light chain restriction) - TRILINEAGE HEMATOPOIESIS. - SEE COMMENT.  Flow cytometry (Accession: [id number]):  - NO MONOCLONAL B-CELL POPULATION IDENTIFIED. - PREDOMINANCE OF T LYMPHOCYTES AND NATURAL KILLER CELLS.   01/05/2023 Pathology Results    FINAL MICROSCOPIC DIAGNOSIS:   A. STOMACH, BIOPSY:  - Gastric mucosa with focally active chronic gastritis, see comment  - Multifocal intestinal metaplasia, negative for dysplasia   B. GE JUNCTION MASS, BIOPSY:  - Invasive moderately differentiated adenocarcinoma    01/07/2023 Imaging  IMPRESSION: 1. Distal esophageal stricture extending over about a 3.9 cm vertical excursion. No findings of metastatic disease to the chest, abdomen, or pelvis. 2. 0.5 cm sub solid nodule in the right middle lobe. Fleischner guidelines do not apply due to  history of malignancy. Surveillance of the context of the patient's follow up oncology imaging is suggested. 3. Nonspecific lobulation of the left mid kidney. This could be incidental or due to CT equivocal end of a dromedary hump but I cannot exclude an underlying left mid kidney mass. This could be surveilled in the context of the patient's follow up oncology imaging, or if clinically warranted, renal MRI with and without contrast could be utilized for further characterization. 4. L1 compression fracture with 90% anterior compression and 6 mm of posterior bony retropulsion, likely late subacute chronicity. 5. Wall thickening in the proximal colon extending to the transverse colon, cannot exclude proximal colitis. 6. Other imaging findings of potential clinical significance: Coronary, aortic arch, and branch vessel atherosclerotic disease. Mitral and aortic valve calcification. Small type 1 hiatal hernia along the lower margin of the mass. Central airway thickening is present, suggesting bronchitis or reactive airways disease. Fatty deposition in the wall of the ascending colon may be incidental but which can have a weak association with inflammatory bowel disease. Lumbar spondylosis and degenerative disc disease contributing to multilevel impingement. 6 mm left kidney lower pole nonobstructive renal calculus.   Esophageal cancer ([place])  01/05/2023 Pathology Results    FINAL MICROSCOPIC DIAGNOSIS:   A. STOMACH, BIOPSY:  - Gastric mucosa with focally active chronic gastritis, see comment  - Multifocal intestinal metaplasia, negative for dysplasia   B. GE JUNCTION MASS, BIOPSY:  - Invasive moderately differentiated adenocarcinoma    01/07/2023 Imaging    IMPRESSION: 1. Distal esophageal stricture extending over about a 3.9 cm vertical excursion. No findings of metastatic disease to the chest, abdomen, or pelvis. 2. 0.5 cm sub solid nodule in the right middle lobe.  Fleischner guidelines do not apply due to history of malignancy. Surveillance of the context of the patient's follow up oncology imaging is suggested. 3. Nonspecific lobulation of the left mid kidney. This could be incidental or due to CT equivocal end of a dromedary hump but I cannot exclude an underlying left mid kidney mass. This could be surveilled in the context of the patient's follow up oncology imaging, or if clinically warranted, renal MRI with and without contrast could be utilized for further characterization. 4. L1 compression fracture with 90% anterior compression and 6 mm of posterior bony retropulsion, likely late subacute chronicity. 5. Wall thickening in the proximal colon extending to the transverse colon, cannot exclude proximal colitis. 6. Other imaging findings of potential clinical significance: Coronary, aortic arch, and branch vessel atherosclerotic disease. Mitral and aortic valve calcification. Small type 1 hiatal hernia along the lower margin of the mass. Central airway thickening is present, suggesting bronchitis or reactive airways disease. Fatty deposition in the wall of the ascending colon may be incidental but which can have a weak association with inflammatory bowel disease. Lumbar spondylosis and degenerative disc disease contributing to multilevel impingement. 6 mm left kidney lower pole nonobstructive renal calculus.     01/09/2023 Initial Diagnosis   Esophageal cancer ([place])      HISTORY OF PRESENTING ILLNESS:  Daniel [name] 89 y.o. male is a here because of  Esophageal Cancer chain The patient was referred by Mansouraty, Telford Nab.,MD. The patient presents to the clinic today accompanied by  by daughter  and son.   Pt developed worsening dysphagia around Thanksgiving last year.  He had dysphagia with solid foods initially, with regurgitation and mucus spitting.  He has changed his diet gradually, he is on liquid only now, drinks boost 3 bottles  a day, which is a maximum he can tolerate.  He has lost about 20 to 30 pounds since then.  No significant nausea, vomiting, or change of bowel habits.  He was referred to GI, saw Dr. Rush Landmark on December 23, 2022.  He underwent EGD on January 05, 2023, which showed a large circumferential mass in the distal esophagus, extending to GE junction.  The mass has caused a significant narrowing of his esophagus, the regular EGD scope was not able to pass.  Biopsy showed adenocarcinoma.  Restaging CT scan showed no definitive evidence of distant metastasis.  Today the patient notes moderate fatigue, he is still able to do most of self-cares.  Before he developed dysphagia, pt was physically active, was able to do activities like mowing the yard, etc. Pt does live alone and is able to take care of himself. Pt had a history of blood clots and is on Eliquis 2.64m bid.    He has a PMHx of Lumbar Back Pain, Chronic Kidney Disease, Hypertension,Pre Diabetic, Prostate cancer(surgery),DVT, Heart Disease, he has family history of malignancy  2 Sister-Breast 1 Sister-Cervical 1- daughter -Breast  Socially... Widow, [place], 5 children, 1 decease, Former Smoker    REVIEW OF SYSTEMS:    Constitutional: Denies fevers, chills or abnormal night sweats, (+) fatigue and weight loss  Eyes: Denies blurriness of vision, double vision or watery eyes Ears, nose, mouth, throat, and face: Denies mucositis or sore throat Respiratory: Denies cough, dyspnea or wheezes Cardiovascular: Denies palpitation, chest discomfort or lower extremity swelling Gastrointestinal: see HPI  Skin: Denies abnormal skin rashes Lymphatics: Denies new lymphadenopathy or easy bruising Neurological:Denies numbness, tingling or new weaknesses Behavioral/Psych:Anxiety    All other systems were reviewed with the patient and are negative.   MEDICAL HISTORY:  Past Medical History:  Diagnosis Date   Blood transfusion    CKD (chronic kidney  disease) stage 4, GFR 15-29 ml/min (HCC)    DVT (deep venous thrombosis) (HCC)    from surgery, no longer on Eliquis   Gout    Hearing loss    Hypertension    Hypertriglyceridemia    Hypothyroid    Prediabetes    [place]    Pulmonary nodule    Tinnitus     SURGICAL HISTORY: Past Surgical History:  Procedure Laterality Date   BIOPSY  01/05/2023   Procedure: BIOPSY;  Surgeon: MIrving Copas, MD;  Location: [facility];  Service: Gastroenterology;;   CATARACT EXTRACTION Bilateral 2020   ESOPHAGOGASTRODUODENOSCOPY (EGD) WITH PROPOFOL N/A 01/05/2023   Procedure: ESOPHAGOGASTRODUODENOSCOPY (EGD) WITH PROPOFOL;  Surgeon: MIrving Copas, MD;  [facility];  Service: Gastroenterology;  Laterality: N/A;   FOREIGN BODY REMOVAL  01/05/2023   Procedure: FOREIGN BODY REMOVAL;  Surgeon: MIrving Copas, MD;  Location: [facility];  Service: Gastroenterology;;   HERNIA REPAIR Right 2010   left inguinal hernia repair  06/29/2008   with mesh sail   PROSTATECTOMY  1998   prostate cancer   right ankle  1980   fracture   right knee  1980   torn meniscus   TOTAL KNEE ARTHROPLASTY Right 01/20/2022   Procedure: TOTAL KNEE ARTHROPLASTY;  Surgeon: LVickey Huger MD;  Location: [facility];  Service: Orthopedics;  Laterality: Right;    SOCIAL HISTORY: Social History   Socioeconomic History   Marital status: Widowed    Spouse name: Not on file   Number of children: 5   Years of education: Not on file   Highest education level: Not on file  Occupational History   Occupation: retired Actor  Tobacco Use   Smoking status: Former    Packs/day: 1.00    Years: 20.00    Total pack years: 20.00    Types: Cigarettes   Smokeless tobacco: Not on file  Vaping Use   Vaping Use: Never used  Substance and Sexual Activity   Alcohol use: Yes    Comment: used to drink moderately,   Drug use: No   Sexual activity: Not on file  Other Topics Concern    Not on file  Social History Narrative   Not on file   Social Determinants of Health   Financial Resource Strain: Not on file  Food Insecurity: Not on file  Transportation Needs: Not on file  Physical Activity: Not on file  Stress: Not on file  Social Connections: Not on file  Intimate Partner Violence: Not on file    FAMILY HISTORY: Family History  Problem Relation Age of Onset   Heart disease Mother    Heart disease Father    Cancer Sister        breast cancer   Heart disease Brother    Prostate cancer Brother    Colon cancer Neg Hx    Esophageal cancer Neg Hx    Inflammatory bowel disease Neg Hx    Liver disease Neg Hx    Pancreatic cancer Neg Hx    Rectal cancer Neg Hx    Stomach cancer Neg Hx     ALLERGIES:  has No Known Allergies.  MEDICATIONS:  Current Outpatient Medications  Medication Sig Dispense Refill   allopurinol (ZYLOPRIM) 100 MG tablet Take 100 mg by mouth every Monday, Wednesday, and Friday.     ALPRAZolam (XANAX) 0.5 MG tablet Take 0.5 mg by mouth 3 (three) times daily as needed for sleep (tremors).     amLODipine (NORVASC) 5 MG tablet Take 5 mg by mouth daily.     apixaban (ELIQUIS) 2.5 MG TABS tablet Take 1 tablet (2.5 mg total) by mouth 2 (two) times daily. 180 tablet 3   cholecalciferol (VITAMIN D) 1000 units tablet Take 1,000 Units by mouth daily.     levothyroxine (SYNTHROID, LEVOTHROID) 100 MCG tablet Take 100 mcg by mouth daily before breakfast.     metoprolol succinate (TOPROL XL) 25 MG 24 hr tablet Take 0.5 tablets (12.5 mg total) by mouth daily. 45 tablet 3   omeprazole (PRILOSEC) 40 MG capsule Open 1 capsule and sprinkle in apple sauce twice daily. 60 capsule 2   sucralfate (CARAFATE) 1 GM/10ML suspension Take 10 mLs (1 g total) by mouth 2 (two) times daily. 420 mL 1   No current facility-administered medications for this visit.    PHYSICAL EXAMINATION: ECOG PERFORMANCE STATUS: 2 - Symptomatic, <50% confined to bed  Vitals:    01/09/23 1452  Resp: 17  Temp: 98.6 F (37 C)  SpO2: 98%   Filed Weights   01/09/23 1452  Weight: 130 lb 11.2 oz (59.3 kg)     GENERAL:alert, no distress and comfortable SKIN: skin color normal, no rashes or significant lesions EYES: normal, Conjunctiva are pink and non-injected, sclera clear  NEURO: alert & oriented x 3 with fluent speech NECK:(-) supple, thyroid  normal size, non-tender, without nodularity LYMPH:  (-) no palpable lymphadenopathy in the cervical, axillary  LUNGS: (-) clear to auscultation and percussion with normal breathing effort HEART: (-) regular rate & rhythm and no murmurs and no (-) lower extremity edema ABDOMEN: (-) abdomen soft, little tender and normal bowel sounds LABORATORY DATA:  I have reviewed the data as listed    Latest Ref Rng & Units 01/09/2023    2:33 PM 12/23/2022   10:36 AM 01/21/2022    3:30 AM  CBC  WBC 4.0 - 10.5 K/uL 9.0  9.2  13.5   Hemoglobin 13.0 - 17.0 g/dL 13.9  14.0  10.3   Hematocrit 39.0 - 52.0 % 39.6  41.5  30.5   Platelets 150 - 400 K/uL 217  316.0  240        Latest Ref Rng & Units 01/09/2023    2:33 PM 12/23/2022   10:36 AM 01/21/2022    3:30 AM  CMP  Glucose 70 - 99 mg/dL 164  106  132   BUN 8 - 23 mg/dL 42  25  33   Creatinine 0.61 - 1.24 mg/dL 2.03  1.90  2.14   Sodium 135 - 145 mmol/L 141  142  138   Potassium 3.5 - 5.1 mmol/L 4.7  4.4  4.4   Chloride 98 - 111 mmol/L 107  104  107   CO2 22 - 32 mmol/L _0 Calcium 8.9 - 10.3 mg/dL 9.6  9.5  8.4   Total Protein 6.5 - 8.1 g/dL 5.8  6.6    Total Bilirubin 0.3 - 1.2 mg/dL 0.5  1.0    Alkaline Phos 38 - 126 U/L 74  71    AST 15 - 41 U/L 23  19    ALT 0 - 44 U/L 13  7       RADIOGRAPHIC STUDIES: I have personally reviewed the radiological images as listed and agreed with the findings in the report. CT CHEST ABDOMEN PELVIS WO CONTRAST  Result Date: 01/07/2023 CLINICAL DATA:  Distal esophageal adenocarcinoma, staging workup * Tracking Code: BO * EXAM: CT  CHEST, ABDOMEN AND PELVIS WITHOUT CONTRAST TECHNIQUE: Multidetector CT imaging of the chest, abdomen and pelvis was performed following the standard protocol without IV contrast. RADIATION DOSE REDUCTION: This exam was performed according to the departmental dose-optimization program which includes automated exposure control, adjustment of the mA and/or kV according to patient size and/or use of iterative reconstruction technique. COMPARISON:  Esophagram 12/29/2022 FINDINGS: CT CHEST FINDINGS Cardiovascular: Coronary, aortic arch, and branch vessel atherosclerotic vascular disease. Mitral and aortic valve calcification. Mediastinum/Nodes: Dilated and contrast filled esophagus extending down to the distal esophageal mass which extends over about a 3.9 cm vertical excursion, with resulting stricturing as shown on image 67 series 7. Small type 1 hiatal hernia along the lower margin of the mass. Small mediastinal lymph nodes are not pathologically enlarged. Lungs/Pleura: Central airway thickening. 0.5 cm sub solid nodule in the right middle lobe on image 121 series 5. Subpleural reticulation along portions of the lower lobes. Musculoskeletal: Markedly severe degenerative glenohumeral arthropathy on the right with proliferative bone spurring noted along the articulation of the below the coracoid. Moderate degenerative glenohumeral arthropathy on the left. Thoracic and lower cervical spondylosis. CT ABDOMEN PELVIS FINDINGS Hepatobiliary: The borderline gallbladder wall thickening. No hepatic parenchymal lesion identified, lack of IV contrast can lower sensitivity. Pancreas: Unremarkable Spleen: Unremarkable Adrenals/Urinary Tract: 6 mm left kidney lower pole nonobstructive  renal calculus. Punctate calcifications along the right renal hilum are likely vascular. No hydronephrosis or hydroureter. Nonspecific lobulation of the left mid kidney is shown on image 70 of series 3 this could be incidental or due to CT equivocal end  of a dromedary hump but I cannot exclude an underlying left mid kidney mass. Stomach/Bowel: There is fat deposition in the wall of the ascending colon which may be incidental but which can have a weak association with inflammatory bowel disease. There is potential wall thickening in the ascending colon extending to the mid transverse colon which could be inflammatory although a component may be from nondistention. The remainder of the colon appears unremarkable. No discrete small bowel abnormality observed. Vascular/Lymphatic: Atherosclerosis is present, including aortoiliac atherosclerotic disease. There is atheromatous plaque at the origin of the celiac artery and substantial atheromatous plaque proximally in the superior mesenteric artery, vascular patency not assessed on today's noncontrast exam. No discrete pathologic adenopathy identified. Prior pelvic lymph node dissection Reproductive: Prostatectomy. Other: No supplemental non-categorized findings. Musculoskeletal: L1 compression fracture with 90% anterior compression and up to 6 mm of posterior bony retropulsion, likely late subacute chronicity. Grade 1 degenerative retrolisthesis at L 3 4, with 5 mm of grade 1 anterolisthesis at L4-5. Lumbar spondylosis and degenerative disc disease contributing to multilevel impingement. IMPRESSION: 1. Distal esophageal stricture extending over about a 3.9 cm vertical excursion. No findings of metastatic disease to the chest, abdomen, or pelvis. 2. 0.5 cm sub solid nodule in the right middle lobe. Fleischner guidelines do not apply due to history of malignancy. Surveillance of the context of the patient's follow up oncology imaging is suggested. 3. Nonspecific lobulation of the left mid kidney. This could be incidental or due to CT equivocal end of a dromedary hump but I cannot exclude an underlying left mid kidney mass. This could be surveilled in the context of the patient's follow up oncology imaging, or if clinically  warranted, renal MRI with and without contrast could be utilized for further characterization. 4. L1 compression fracture with 90% anterior compression and 6 mm of posterior bony retropulsion, likely late subacute chronicity. 5. Wall thickening in the proximal colon extending to the transverse colon, cannot exclude proximal colitis. 6. Other imaging findings of potential clinical significance: Coronary, aortic arch, and branch vessel atherosclerotic disease. Mitral and aortic valve calcification. Small type 1 hiatal hernia along the lower margin of the mass. Central airway thickening is present, suggesting bronchitis or reactive airways disease. Fatty deposition in the wall of the ascending colon may be incidental but which can have a weak association with inflammatory bowel disease. Lumbar spondylosis and degenerative disc disease contributing to multilevel impingement. 6 mm left kidney lower pole nonobstructive renal calculus. Aortic Atherosclerosis (ICD10-I70.0). Electronically Signed   By: Van Clines M.D.   On: 01/07/2023 11:55   DG ESOPHAGUS W SINGLE CM (SOL OR THIN BA)  Result Date: 12/29/2022 CLINICAL DATA:  Dysphagia, unspecified type. Unintentional weight loss. History of anemia. Gastroesophageal reflux disease, unspecified whether esophagitis present. Atrial fibrillation, unspecified type. GERD. EXAM: ESOPHAGUS/BARIUM SWALLOW/TABLET STUDY TECHNIQUE: A single contrast examination was performed using thin liquid barium. The exam was performed by Narda Rutherford, NP, and was supervised and interpreted by Dr. Kellie Simmering. FLUOROSCOPY: Fluoroscopy time: 1 minute, 30 seconds (14.2 mGy). COMPARISON:  Abdominal radiographs 12/23/2022. FINDINGS: There is a segment of marked narrowing within the distal esophagus with shouldered and somewhat irregular margins. There is delayed and intermittent passage of contrast at this site (with incomplete esophageal  clearance). Moderate intermittent esophageal dysmotility  with tertiary contractions. Small sliding hiatal hernia. Incomplete esophageal clearance limits evaluation for gastroesophageal reflux. Impression #1 will be called to the ordering clinician or representative by the Radiologist Assistant, and communication documented in the PACS or [employer]. IMPRESSION: 1. Segment of high-grade narrowing within the distal esophagus with shouldered and somewhat irregular margins. This is compatible with an esophageal stricture, and the constellation of findings is highly suspicious for an esophageal malignancy. Endoscopy and/or a contrast-enhanced chest CT should be considered for further evaluation. Delayed and intermittent passage of contrast at this site (with incomplete esophageal clearance). 2. Moderate esophageal dysmotility with tertiary contractions. 3. Small sliding hiatal hernia. Electronically Signed   By: Kellie Simmering D.O.   On: 12/29/2022 11:17   DG Abd 2 Views  Result Date: 12/23/2022 CLINICAL DATA:  Weight loss, GERD, and dysphagia. EXAM: ABDOMEN - 2 VIEW COMPARISON:  None Available. FINDINGS: Possible small stone in the lower pole of the left kidney versus bowel contents or overlying soft tissue calcification. No other renal or ureteral stones identified. Surgical clips are identified in the pelvis. No free air, portal venous gas, or pneumatosis. Moderate fecal loading in the ascending colon. Vascular calcifications in the lower extremities. No other bony or soft tissue abnormalities. IMPRESSION: 1. Possible small stone in the lower pole of the left kidney versus bowel contents or overlying soft tissue calcification. 2. Moderate fecal loading in the ascending colon. 3. Vascular calcifications. Electronically Signed   By: Dorise Bullion III M.D.   On: 12/23/2022 18:23     Orders Placed This Encounter  Procedures   NM PET Image Initial (PI) Skull Base To Thigh    Standing Status:   Future    Standing Expiration Date:   01/09/2024    Order Specific  Question:   If indicated for the ordered procedure, I authorize the administration of a radiopharmaceutical per Radiology protocol    Answer:   Yes    Order Specific Question:   Preferred imaging location?    Answer:   [facility]   Ambulatory Referral to [facility]    Referral Priority:   Urgent    Referral Type:   Consultation    Referral Reason:   Specialty Services Required    Number of Visits Requested:   1    All questions were answered. The patient knows to call the clinic with any problems, questions or concerns. The total time spent in the appointment was 60 minutes.     Truitt Merle, MD 01/09/2023   Felicity Coyer am acting as scribe for Truitt Merle, MD.   I have reviewed the above documentation for accuracy and completeness, and I agree with the above.

## 2023-01-09 NOTE — Progress Notes (Signed)
I provided instruction on PEG tube site care with Daniel [name], his daughter and son.  I will meet with them again to review this prior to PEG placement.  I have placed a nutrition referral for tube feeding recommendations and management.

## 2023-01-10 ENCOUNTER — Inpatient Hospital Stay: Payer: Medicare Other

## 2023-01-10 VITALS — BP 125/66 | HR 60 | Temp 98.0°F | Resp 16

## 2023-01-10 DIAGNOSIS — C159 Malignant neoplasm of esophagus, unspecified: Secondary | ICD-10-CM

## 2023-01-10 DIAGNOSIS — C155 Malignant neoplasm of lower third of esophagus: Secondary | ICD-10-CM | POA: Diagnosis not present

## 2023-01-10 MED ORDER — SODIUM CHLORIDE 0.9 % IV SOLN: INTRAVENOUS | Status: DC

## 2023-01-10 NOTE — Patient Instructions (Signed)

## 2023-01-11 ENCOUNTER — Encounter: Payer: Self-pay | Admitting: Hematology

## 2023-01-11 NOTE — Addendum Note (Signed)
Addended by: Truitt Merle on: 01/11/2023 12:15 PM   Modules accepted: Orders

## 2023-01-12 ENCOUNTER — Inpatient Hospital Stay: Payer: Medicare Other | Admitting: Physician Assistant

## 2023-01-12 ENCOUNTER — Inpatient Hospital Stay: Payer: Medicare Other

## 2023-01-12 ENCOUNTER — Inpatient Hospital Stay: Payer: Medicare Other | Admitting: Licensed Clinical Social Worker

## 2023-01-12 ENCOUNTER — Other Ambulatory Visit: Payer: Self-pay

## 2023-01-12 ENCOUNTER — Telehealth: Payer: Self-pay | Admitting: Hematology

## 2023-01-12 DIAGNOSIS — C159 Malignant neoplasm of esophagus, unspecified: Secondary | ICD-10-CM

## 2023-01-12 DIAGNOSIS — R1319 Other dysphagia: Secondary | ICD-10-CM

## 2023-01-12 LAB — CEA (ACCESS): CEA (CHCC): 1.63 ng/mL (ref 0.00–5.00)

## 2023-01-12 NOTE — Progress Notes (Signed)
I spoke with Ms gibbs and let her know I have scheduled Pet scan for her father.  I gave her the appt location, date, arrival time, and instructions.  All questions were answered.  Sh verbalized understanding.

## 2023-01-12 NOTE — Progress Notes (Signed)
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 A diagnosis of Malignant neoplasm of lower third of esophagus (HCC) was also pertinent to this visit.   HISTORY OF PRESENT ILLNESS: Daniel [name] is a 89 y.o. male seen at the request of Dr. Burr Medico for a newly diagnosed adenocarcinoma of the distal esophagus. The patient has a history of Multiple myeloma that appears to have been followed expectantly.  This was diagnosed in 2019.  She presented recently however with worsening dysphagia and weight loss.  He underwent endoscopy on 01/05/2022, and food bolus was noted in the entire esophagus as well as a medium sized fungating ulcerative mass with stigmata of recent bleeding 38 cm from the incisors completely obstructing and circumferential, he was only able to traverse the stenosis and mass by transitioning to a neonatal endoscope.  Biopsies were obtained and were consistent with adenocarcinoma.  Biopsy of his stomach also showed focally active gastritis with multifocal intestinal metaplasia but negative for dysplastic findings.  The patient met with Dr. Burr Medico after a CT scan had also been performed of the chest abdomen pelvis on 01/07/2023 showing distal esophageal stricture extending over 3.9 cm with no evidence of metastatic disease.  A 5 mm right middle lobe nodule was noted but felt to be benign in appearance.  Nonspecific lobulation of the left mid kidney was noted, and L1 compression fracture with 90% anterior compression and posterior bony retropulsion was noted felt to be subacute, wall thickening the proximal colon could not exclude colitis, and evidence of central airway thickening, and atherosclerotic disease and degenerative spine  disease was noted.  A 6 mm left kidney lower pole of nonobstructive renal calculus was also seen.  A PET scan is scheduled tomorrow morning.  Given his age and comorbidities he is not felt to be a good candidate for surgery or for systemic chemotherapy, he is seen to discuss radiotherapy options.  Of note he is also getting set up for a feeding tube on 01/20/23.    PREVIOUS RADIATION THERAPY: No   PAST MEDICAL HISTORY:  Past Medical History:  Diagnosis Date   Blood transfusion    CKD (chronic kidney disease) stage 4, GFR 15-29 ml/min (HCC)    DVT (deep venous thrombosis) (HCC)    from surgery, no longer on Eliquis   Gout    Hearing loss    Hypertension    Hypertriglyceridemia    Hypothyroid    Prediabetes    Prostate cancer ([place])    Pulmonary nodule    Tinnitus        PAST SURGICAL HISTORY: Past Surgical History:  Procedure Laterality Date   BIOPSY  01/05/2023   Procedure: BIOPSY;  Surgeon: Irving Copas., MD;  Location: [facility];  Service: Gastroenterology;;   CATARACT EXTRACTION Bilateral 2020   ESOPHAGOGASTRODUODENOSCOPY (EGD) WITH PROPOFOL N/A 01/05/2023   Procedure: ESOPHAGOGASTRODUODENOSCOPY (EGD) WITH PROPOFOL;  Surgeon: Irving Copas., MD;  Location: [facility];  Service: Gastroenterology;  Laterality: N/A;   FOREIGN BODY REMOVAL  01/05/2023   Procedure: FOREIGN BODY REMOVAL;  Surgeon: Rush Landmark Telford Nab., MD;  Location: [facility];  Service: Gastroenterology;;   HERNIA REPAIR Right 2010   left inguinal hernia  repair  06/29/2008   with mesh sail   PROSTATECTOMY  1998   prostate cancer   right ankle  1980   fracture   right knee  1980   torn meniscus   TOTAL KNEE ARTHROPLASTY Right 01/20/2022   Procedure: TOTAL KNEE ARTHROPLASTY;  Surgeon: Vickey Huger, MD;  Location: [facility];  Service: Orthopedics;  Laterality: Right;     FAMILY HISTORY:  Family History  Problem Relation Age of Onset   Heart disease Mother    Heart disease Father     Cancer Sister        breast cancer   Heart disease Brother    Prostate cancer Brother    Colon cancer Neg Hx    Esophageal cancer Neg Hx    Inflammatory bowel disease Neg Hx    Liver disease Neg Hx    Pancreatic cancer Neg Hx    Rectal cancer Neg Hx    Stomach cancer Neg Hx      SOCIAL HISTORY:  reports that he has quit smoking. His smoking use included cigarettes. He has a 20.00 pack-year smoking history. He does not have any smokeless tobacco history on file. He reports current alcohol use. He reports that he does not use drugs.  The patient is widowed and lives in [place].  He is accompanied by his daughter [name]. He lives alone and prior to his most recent difficulties in the last few weeks, he was driving, and living independently.    ALLERGIES: Patient has no known allergies.   MEDICATIONS:  Current Outpatient Medications  Medication Sig Dispense Refill   allopurinol (ZYLOPRIM) 100 MG tablet Take 100 mg by mouth every Monday, Wednesday, and Friday.     ALPRAZolam (XANAX) 0.5 MG tablet Take 0.5 mg by mouth 3 (three) times daily as needed for sleep (tremors).     amLODipine (NORVASC) 5 MG tablet Take 5 mg by mouth daily.     apixaban (ELIQUIS) 2.5 MG TABS tablet Take 1 tablet (2.5 mg total) by mouth 2 (two) times daily. 180 tablet 3   cholecalciferol (VITAMIN D) 1000 units tablet Take 1,000 Units by mouth daily.     levothyroxine (SYNTHROID, LEVOTHROID) 100 MCG tablet Take 100 mcg by mouth daily before breakfast.     metoprolol succinate (TOPROL XL) 25 MG 24 hr tablet Take 0.5 tablets (12.5 mg total) by mouth daily. 45 tablet 3   omeprazole (PRILOSEC) 40 MG capsule Open 1 capsule and sprinkle in apple sauce twice daily. 60 capsule 2   sucralfate (CARAFATE) 1 GM/10ML suspension Take 10 mLs (1 g total) by mouth 2 (two) times daily. 420 mL 1   No current facility-administered medications for this visit.     REVIEW OF SYSTEMS: On review of systems, the patient reports  that he is doing okay. He reports he has been having difficulty with swallowing off and on since Thanksgiving last year, and has lost about 50 pounds of weight. He has discomfort that is like a sharp shooting pain when he tries to swallow more than liquids. No other complaints are verbalized.      PHYSICAL EXAM:  Wt Readings from Last 3 Encounters:  01/09/23 130 lb 11.2 oz (59.3 kg)  01/05/23 138 lb 0.1 oz (62.6 kg)  12/24/22 138 lb (62.6 kg)   Temp Readings from Last 3 Encounters:  01/10/23 98 F (36.7 C) (Oral)  01/09/23 98.6 F (37 C) (Oral)  01/05/23 97.6 F (36.4 C) (Temporal)   BP Readings from Last  3 Encounters:  01/10/23 125/66  01/05/23 (!) 140/63  12/24/22 108/70   Pulse Readings from Last 3 Encounters:  01/10/23 60  01/05/23 71  12/24/22 69    /10  In general this is a thin  appearing Caucasian male in no acute distress.  He's alert and oriented x4 and appropriate throughout the examination. Cardiopulmonary assessment is negative for acute distress and he exhibits normal effort.     ECOG = 1  0 - Asymptomatic (Fully active, able to carry on all predisease activities without restriction)  1 - Symptomatic but completely ambulatory (Restricted in physically strenuous activity but ambulatory and able to carry out work of a light or sedentary nature. For example, light housework, office work)  2 - Symptomatic, <50% in bed during the day (Ambulatory and capable of all self care but unable to carry out any work activities. Up and about more than 50% of waking hours)  3 - Symptomatic, >50% in bed, but not bedbound (Capable of only limited self-care, confined to bed or chair 50% or more of waking hours)  4 - Bedbound (Completely disabled. Cannot carry on any self-care. Totally confined to bed or chair)  5 - Death   Eustace Pen MM, Creech RH, Tormey DC, et al. [phone]). "Toxicity and response criteria of the [facility]". [place] Oncol. 5 (6):  649-55    LABORATORY DATA:  Lab Results  Component Value Date   WBC 9.0 01/09/2023   HGB 13.9 01/09/2023   HCT 39.6 01/09/2023   MCV 100.8 (H) 01/09/2023   PLT 217 01/09/2023   Lab Results  Component Value Date   NA 141 01/09/2023   K 4.7 01/09/2023   CL 107 01/09/2023   CO2 29 01/09/2023   Lab Results  Component Value Date   ALT 13 01/09/2023   AST 23 01/09/2023   ALKPHOS 74 01/09/2023   BILITOT 0.5 01/09/2023      RADIOGRAPHY: CT CHEST ABDOMEN PELVIS WO CONTRAST  Result Date: 01/07/2023 CLINICAL DATA:  Distal esophageal adenocarcinoma, staging workup * Tracking Code: BO * EXAM: CT CHEST, ABDOMEN AND PELVIS WITHOUT CONTRAST TECHNIQUE: Multidetector CT imaging of the chest, abdomen and pelvis was performed following the standard protocol without IV contrast. RADIATION DOSE REDUCTION: This exam was performed according to the departmental dose-optimization program which includes automated exposure control, adjustment of the mA and/or kV according to patient size and/or use of iterative reconstruction technique. COMPARISON:  Esophagram 12/29/2022 FINDINGS: CT CHEST FINDINGS Cardiovascular: Coronary, aortic arch, and branch vessel atherosclerotic vascular disease. Mitral and aortic valve calcification. Mediastinum/Nodes: Dilated and contrast filled esophagus extending down to the distal esophageal mass which extends over about a 3.9 cm vertical excursion, with resulting stricturing as shown on image 67 series 7. Small type 1 hiatal hernia along the lower margin of the mass. Small mediastinal lymph nodes are not pathologically enlarged. Lungs/Pleura: Central airway thickening. 0.5 cm sub solid nodule in the right middle lobe on image 121 series 5. Subpleural reticulation along portions of the lower lobes. Musculoskeletal: Markedly severe degenerative glenohumeral arthropathy on the right with proliferative bone spurring noted along the articulation of the below the coracoid. Moderate  degenerative glenohumeral arthropathy on the left. Thoracic and lower cervical spondylosis. CT ABDOMEN PELVIS FINDINGS Hepatobiliary: The borderline gallbladder wall thickening. No hepatic parenchymal lesion identified, lack of IV contrast can lower sensitivity. Pancreas: Unremarkable Spleen: Unremarkable Adrenals/Urinary Tract: 6 mm left kidney lower pole nonobstructive renal calculus. Punctate calcifications along the right renal hilum  are likely vascular. No hydronephrosis or hydroureter. Nonspecific lobulation of the left mid kidney is shown on image 70 of series 3 this could be incidental or due to CT equivocal end of a dromedary hump but I cannot exclude an underlying left mid kidney mass. Stomach/Bowel: There is fat deposition in the wall of the ascending colon which may be incidental but which can have a weak association with inflammatory bowel disease. There is potential wall thickening in the ascending colon extending to the mid transverse colon which could be inflammatory although a component may be from nondistention. The remainder of the colon appears unremarkable. No discrete small bowel abnormality observed. Vascular/Lymphatic: Atherosclerosis is present, including aortoiliac atherosclerotic disease. There is atheromatous plaque at the origin of the celiac artery and substantial atheromatous plaque proximally in the superior mesenteric artery, vascular patency not assessed on today's noncontrast exam. No discrete pathologic adenopathy identified. Prior pelvic lymph node dissection Reproductive: Prostatectomy. Other: No supplemental non-categorized findings. Musculoskeletal: L1 compression fracture with 90% anterior compression and up to 6 mm of posterior bony retropulsion, likely late subacute chronicity. Grade 1 degenerative retrolisthesis at L 3 4, with 5 mm of grade 1 anterolisthesis at L4-5. Lumbar spondylosis and degenerative disc disease contributing to multilevel impingement. IMPRESSION: 1.  Distal esophageal stricture extending over about a 3.9 cm vertical excursion. No findings of metastatic disease to the chest, abdomen, or pelvis. 2. 0.5 cm sub solid nodule in the right middle lobe. Fleischner guidelines do not apply due to history of malignancy. Surveillance of the context of the patient's follow up oncology imaging is suggested. 3. Nonspecific lobulation of the left mid kidney. This could be incidental or due to CT equivocal end of a dromedary hump but I cannot exclude an underlying left mid kidney mass. This could be surveilled in the context of the patient's follow up oncology imaging, or if clinically warranted, renal MRI with and without contrast could be utilized for further characterization. 4. L1 compression fracture with 90% anterior compression and 6 mm of posterior bony retropulsion, likely late subacute chronicity. 5. Wall thickening in the proximal colon extending to the transverse colon, cannot exclude proximal colitis. 6. Other imaging findings of potential clinical significance: Coronary, aortic arch, and branch vessel atherosclerotic disease. Mitral and aortic valve calcification. Small type 1 hiatal hernia along the lower margin of the mass. Central airway thickening is present, suggesting bronchitis or reactive airways disease. Fatty deposition in the wall of the ascending colon may be incidental but which can have a weak association with inflammatory bowel disease. Lumbar spondylosis and degenerative disc disease contributing to multilevel impingement. 6 mm left kidney lower pole nonobstructive renal calculus. Aortic Atherosclerosis (ICD10-I70.0). Electronically Signed   By: Van Clines M.D.   On: 01/07/2023 11:55   DG ESOPHAGUS W SINGLE CM (SOL OR THIN BA)  Result Date: 12/29/2022 CLINICAL DATA:  Dysphagia, unspecified type. Unintentional weight loss. History of anemia. Gastroesophageal reflux disease, unspecified whether esophagitis present. Atrial fibrillation,  unspecified type. GERD. EXAM: ESOPHAGUS/BARIUM SWALLOW/TABLET STUDY TECHNIQUE: A single contrast examination was performed using thin liquid barium. The exam was performed by Narda Rutherford, NP, and was supervised and interpreted by Dr. Kellie Simmering. FLUOROSCOPY: Fluoroscopy time: 1 minute, 30 seconds (14.2 mGy). COMPARISON:  Abdominal radiographs 12/23/2022. FINDINGS: There is a segment of marked narrowing within the distal esophagus with shouldered and somewhat irregular margins. There is delayed and intermittent passage of contrast at this site (with incomplete esophageal clearance). Moderate intermittent esophageal dysmotility with tertiary contractions. Small  sliding hiatal hernia. Incomplete esophageal clearance limits evaluation for gastroesophageal reflux. Impression #1 will be called to the ordering clinician or representative by the Radiologist Assistant, and communication documented in the PACS or [employer]. IMPRESSION: 1. Segment of high-grade narrowing within the distal esophagus with shouldered and somewhat irregular margins. This is compatible with an esophageal stricture, and the constellation of findings is highly suspicious for an esophageal malignancy. Endoscopy and/or a contrast-enhanced chest CT should be considered for further evaluation. Delayed and intermittent passage of contrast at this site (with incomplete esophageal clearance). 2. Moderate esophageal dysmotility with tertiary contractions. 3. Small sliding hiatal hernia. Electronically Signed   By: Kellie Simmering D.O.   On: 12/29/2022 11:17   DG Abd 2 Views  Result Date: 12/23/2022 CLINICAL DATA:  Weight loss, GERD, and dysphagia. EXAM: ABDOMEN - 2 VIEW COMPARISON:  None Available. FINDINGS: Possible small stone in the lower pole of the left kidney versus bowel contents or overlying soft tissue calcification. No other renal or ureteral stones identified. Surgical clips are identified in the pelvis. No free air, portal venous gas, or  pneumatosis. Moderate fecal loading in the ascending colon. Vascular calcifications in the lower extremities. No other bony or soft tissue abnormalities. IMPRESSION: 1. Possible small stone in the lower pole of the left kidney versus bowel contents or overlying soft tissue calcification. 2. Moderate fecal loading in the ascending colon. 3. Vascular calcifications. Electronically Signed   By: Dorise Bullion III M.D.   On: 12/23/2022 18:23       IMPRESSION/PLAN: 1. Adenocarcinoma of the distal esophagus. Dr. Lisbeth Renshaw discusses the pathology findings and reviews the nature of esophageal cancer. We discussed the consideration of radiotherapy alone since Dr. Burr Medico does not recommend systemic chemotherapy. He is aware that surgery would not be recommended either given his age and comorbidities. Dr. Lisbeth Renshaw outlines options of more definitive therapy with radiation versus palliative options. The patient is open to being aggressive if appropriate and attempting the definitive course of treatment.  We discussed the risks, benefits, short, and long term effects of radiotherapy, as well as the differences in intent, and the patient is interested in proceeding with attempting curative dose radiation. Dr. Lisbeth Renshaw discusses the delivery and logistics of radiotherapy and anticipates a course of 5 1/2 weeks of radiotherapy to the esophagus. Written consent is obtained and placed in the chart, a copy was provided to the patient. The patient will simulate tomorrow after his PET scan.   In a visit lasting 60 minutes, greater than 50% of the time was spent face to face discussing the patient's condition, in preparation for the discussion, and coordinating the patient's care.   The above documentation reflects my direct findings during this shared patient visit. Please see the separate note by Dr. Lisbeth Renshaw on this date for the remainder of the patient's plan of care.    Carola Rhine, [facility]   **Disclaimer: This note was dictated  with voice recognition software. Similar sounding words can inadvertently be transcribed and this note may contain transcription errors which may not have been corrected upon publication of note.**

## 2023-01-12 NOTE — Telephone Encounter (Signed)
Contacted patient to scheduled appointments. Patient is aware of appointments that are scheduled.   

## 2023-01-12 NOTE — Progress Notes (Signed)
GI Location of Tumor / Histology: Esophagus- Distal  Clint Martinique presented with worsening dysphagia and weight loss.  PET 01/21/2023:  CT CAP 01/07/2023: Distal esophageal stricture extending over about a 3.9 cm vertical excursion. No findings of metastatic disease to the chest, abdomen, or pelvis.  0.5 cm sub solid nodule in the right middle lobe.  Endoscopy 01/05/2023: Food bolus was noted in the entire esophagus as well as a medium sized fungating ulcerative mass with stigmata of recent bleeding 38 cm from the incisors completely obstructing and circumferential.    Biopsies of [place] Mass 01/05/2023     Past/Anticipated interventions by surgeon, if any:    Past/Anticipated interventions by medical oncology, if any:  Dr. Burr Medico 01/09/2023 -I recommend a PET scan for staging and radiation planning. -I discussed treatment options, including radiation, chemotherapy, and surgery.  Given his advanced age, I do not think he is a good candidate for chemotherapy or surgery.  I recommend him to consider radiation, with the goal to improve his quality of life and hopefully he can eat food again. Pt agree with the plan.   Weight changes, if any:   Bowel/Bladder complaints, if any:   Nausea / Vomiting, if any:   Pain issues, if any:    Appetite: Liquid diet   SAFETY ISSUES: Prior radiation?  Pacemaker/ICD?  Possible current pregnancy? N/a Is the patient on methotrexate?   Current Complaints/Details:

## 2023-01-12 NOTE — Progress Notes (Signed)
[place] Work  Initial Assessment   Daniel [name] is a 89 y.o. year old male contacted by phone. Clinical Social Work was referred by medical provider for assessment of psychosocial needs.   SDOH (Social Determinants of Health) assessments performed: Yes   SDOH Screenings   Tobacco Use: Medium Risk (01/09/2023)     Distress Screen completed: No     No data to display            Family/Social Information:  Housing Arrangement: patient lives alone.  Per daughter pt has been independent in ADLs, continuing to do his own      Family members/support persons in your life? Pt has 3 daughters who all live no more than 15 minutes away.  At this time pt's daughters are taking turn checking in on patient and driving him to all appointments.   Transportation concerns: no  Employment: Retired .  Income source: Paediatric nurse concerns: No Type of concern: None Food access concerns: no Religious or spiritual practice: Hydrographic surveyor Currently in place:  none  Coping/ Adjustment to diagnosis: Patient understands treatment plan and what happens next? yes Concerns about diagnosis and/or treatment: Quality of life Patient reported stressors: Adjusting to my illness and Physical issues Hopes and/or priorities: Pt's priority is to start palliative treatment w/ the hope of an improved quality of life. Patient enjoys time with family/ friends Current coping skills/ strengths: Motivation for treatment/growth  and Supportive family/friends     SUMMARY: Current SDOH Barriers:  No barriers identified  Clinical Social Work Clinical Goal(s):  No clinical social work goals at this time  Interventions: Discussed common feeling and emotions when being diagnosed with cancer, and the importance of support during treatment Informed patient of the support team roles and support services at [facility] Provided [place] contact information and encouraged patient to call  with any questions or concerns    Follow Up Plan: Patient will contact CSW with any support or resource needs Patient verbalizes understanding of plan: Yes    Henriette Combs, LCSW

## 2023-01-13 ENCOUNTER — Other Ambulatory Visit: Payer: Self-pay

## 2023-01-13 ENCOUNTER — Ambulatory Visit
Admission: RE | Admit: 2023-01-13 | Discharge: 2023-01-13 | Disposition: A | Discharge status: Home / Self Care | Payer: Medicare Other | Source: Ambulatory Visit | Attending: Radiation Oncology | Admitting: Radiation Oncology

## 2023-01-13 ENCOUNTER — Telehealth (HOSPITAL_COMMUNITY): Payer: Self-pay | Admitting: Cardiology

## 2023-01-13 ENCOUNTER — Inpatient Hospital Stay: Payer: Medicare Other

## 2023-01-13 VITALS — BP 134/66 | HR 65 | Temp 97.5°F | Resp 20 | Ht 65.5 in | Wt 135.2 lb

## 2023-01-13 DIAGNOSIS — N200 Calculus of kidney: Secondary | ICD-10-CM | POA: Insufficient documentation

## 2023-01-13 DIAGNOSIS — Z8546 Personal history of malignant neoplasm of prostate: Secondary | ICD-10-CM | POA: Insufficient documentation

## 2023-01-13 DIAGNOSIS — Z923 Personal history of irradiation: Secondary | ICD-10-CM | POA: Insufficient documentation

## 2023-01-13 DIAGNOSIS — K222 Esophageal obstruction: Secondary | ICD-10-CM | POA: Insufficient documentation

## 2023-01-13 DIAGNOSIS — M5136 Other intervertebral disc degeneration, lumbar region: Secondary | ICD-10-CM | POA: Insufficient documentation

## 2023-01-13 DIAGNOSIS — Z87891 Personal history of nicotine dependence: Secondary | ICD-10-CM | POA: Insufficient documentation

## 2023-01-13 DIAGNOSIS — E039 Hypothyroidism, unspecified: Secondary | ICD-10-CM | POA: Insufficient documentation

## 2023-01-13 DIAGNOSIS — Z803 Family history of malignant neoplasm of breast: Secondary | ICD-10-CM | POA: Insufficient documentation

## 2023-01-13 DIAGNOSIS — M47816 Spondylosis without myelopathy or radiculopathy, lumbar region: Secondary | ICD-10-CM | POA: Insufficient documentation

## 2023-01-13 DIAGNOSIS — Z7901 Long term (current) use of anticoagulants: Secondary | ICD-10-CM | POA: Insufficient documentation

## 2023-01-13 DIAGNOSIS — N184 Chronic kidney disease, stage 4 (severe): Secondary | ICD-10-CM | POA: Insufficient documentation

## 2023-01-13 DIAGNOSIS — K449 Diaphragmatic hernia without obstruction or gangrene: Secondary | ICD-10-CM | POA: Insufficient documentation

## 2023-01-13 DIAGNOSIS — K219 Gastro-esophageal reflux disease without esophagitis: Secondary | ICD-10-CM | POA: Insufficient documentation

## 2023-01-13 DIAGNOSIS — C9000 Multiple myeloma not having achieved remission: Secondary | ICD-10-CM

## 2023-01-13 DIAGNOSIS — E781 Pure hyperglyceridemia: Secondary | ICD-10-CM | POA: Insufficient documentation

## 2023-01-13 DIAGNOSIS — Z79899 Other long term (current) drug therapy: Secondary | ICD-10-CM | POA: Insufficient documentation

## 2023-01-13 DIAGNOSIS — K828 Other specified diseases of gallbladder: Secondary | ICD-10-CM | POA: Insufficient documentation

## 2023-01-13 DIAGNOSIS — C155 Malignant neoplasm of lower third of esophagus: Secondary | ICD-10-CM | POA: Diagnosis not present

## 2023-01-13 DIAGNOSIS — I129 Hypertensive chronic kidney disease with stage 1 through stage 4 chronic kidney disease, or unspecified chronic kidney disease: Secondary | ICD-10-CM | POA: Insufficient documentation

## 2023-01-13 DIAGNOSIS — D539 Nutritional anemia, unspecified: Secondary | ICD-10-CM

## 2023-01-13 MED ORDER — SODIUM CHLORIDE 0.9 % IV SOLN: Freq: Once | INTRAVENOUS | Status: AC

## 2023-01-13 NOTE — Progress Notes (Signed)
I spoke with Daniel [name] daughter, [name].  I gave the date time location and instructions for PEG placement.  He is to stop eliquis 2/4 last dose 2/3 pm.  NPO after midnight  He is to drink barium the night before starting at 6pm

## 2023-01-13 NOTE — Patient Instructions (Signed)
Dehydration, Adult Dehydration is condition in which there is not enough water or other fluids in the body. This happens when a person loses more fluids than he or she takes in. Important body parts cannot work right without the right amount of fluids. Any loss of fluids from the body can cause dehydration. Dehydration can be mild, worse, or very bad. It should be treated right away to keep it from getting very bad. What are the causes? This condition may be caused by: Conditions that cause loss of water or other fluids, such as: Watery poop (diarrhea). Vomiting. Sweating a lot. Peeing (urinating) a lot. Not drinking enough fluids, especially when you: Are ill. Are doing things that take a lot of energy to do. Other illnesses and conditions, such as fever or infection. Certain medicines, such as medicines that take extra fluid out of the body (diuretics). Lack of safe drinking water. Not being able to get enough water and food. What increases the risk? The following factors may make you more likely to develop this condition: Having a long-term (chronic) illness that has not been treated the right way, such as: Diabetes. Heart disease. Kidney disease. Being 65 years of age or older. Having a disability. Living in a place that is high above the ground or sea (high in altitude). The thinner, dried air causes more fluid loss. Doing exercises that put stress on your body for a long time. What are the signs or symptoms? Symptoms of dehydration depend on how bad it is. Mild or worse dehydration Thirst. Dry lips or dry mouth. Feeling dizzy or light-headed, especially when you stand up from sitting. Muscle cramps. Your body making: Dark pee (urine). Pee may be the color of tea. Less pee than normal. Less tears than normal. Headache. Very bad dehydration Changes in skin. Skin may: Be cold to the touch (clammy). Be blotchy or pale. Not go back to normal right after you lightly pinch  it and let it go. Little or no tears, pee, or sweat. Changes in vital signs, such as: Fast breathing. Low blood pressure. Weak pulse. Pulse that is more than 100 beats a minute when you are sitting still. Other changes, such as: Feeling very thirsty. Eyes that look hollow (sunken). Cold hands and feet. Being mixed up (confused). Being very tired (lethargic) or having trouble waking from sleep. Short-term weight loss. Loss of consciousness. How is this treated? Treatment for this condition depends on how bad it is. Treatment should start right away. Do not wait until your condition gets very bad. Very bad dehydration is an emergency. You will need to go to a hospital. Mild or worse dehydration can be treated at home. You may be asked to: Drink more fluids. Drink an oral rehydration solution (ORS). This drink helps get the right amounts of fluids and salts and minerals in the blood (electrolytes). Very bad dehydration can be treated: With fluids through an IV tube. By getting normal levels of salts and minerals in your blood. This is often done by giving salts and minerals through a tube. The tube is passed through your nose and into your stomach. By treating the root cause. Follow these instructions at home: Oral rehydration solution If told by your doctor, drink an ORS: Make an ORS. Use instructions on the package. Start by drinking small amounts, about  cup (120 mL) every 5-10 minutes. Slowly drink more until you have had the amount that your doctor said to have. Eating and drinking          Drink enough clear fluid to keep your pee pale yellow. If you were told to drink an ORS, finish the ORS first. Then, start slowly drinking other clear fluids. Drink fluids such as: Water. Do not drink only water. Doing that can make the salt (sodium) level in your body get too low. Water from ice chips you suck on. Fruit juice that you have added water to (diluted). Low-calorie sports  drinks. Eat foods that have the right amounts of salts and minerals, such as: Bananas. Oranges. Potatoes. Tomatoes. Spinach. Do not drink alcohol. Avoid: Drinks that have a lot of sugar. These include: High-calorie sports drinks. Fruit juice that you did not add water to. Soda. Caffeine. Foods that are greasy or have a lot of fat or sugar. General instructions Take over-the-counter and prescription medicines only as told by your doctor. Do not take salt tablets. Doing that can make the salt level in your body get too high. Return to your normal activities as told by your doctor. Ask your doctor what activities are safe for you. Keep all follow-up visits as told by your doctor. This is important. Contact a doctor if: You have pain in your belly (abdomen) and the pain: Gets worse. Stays in one place. You have a rash. You have a stiff neck. You get angry or annoyed (irritable) more easily than normal. You are more tired or have a harder time waking than normal. You feel: Weak or dizzy. Very thirsty. Get help right away if you have: Any symptoms of very bad dehydration. Symptoms of vomiting, such as: You cannot eat or drink without vomiting. Your vomiting gets worse or does not go away. Your vomit has blood or green stuff in it. Symptoms that get worse with treatment. A fever. A very bad headache. Problems with peeing or pooping (having a bowel movement), such as: Watery poop that gets worse or does not go away. Blood in your poop (stool). This may cause poop to look black and tarry. Not peeing in 6-8 hours. Peeing only a small amount of very dark pee in 6-8 hours. Trouble breathing. These symptoms may be an emergency. Do not wait to see if the symptoms will go away. Get medical help right away. Call your local emergency services (911 in the [place] Do not drive yourself to the hospital. Summary Dehydration is a condition in which there is not enough water or other fluids  in the body. This happens when a person loses more fluids than he or she takes in. Treatment for this condition depends on how bad it is. Treatment should be started right away. Do not wait until your condition gets very bad. Drink enough clear fluid to keep your pee pale yellow. If you were told to drink an oral rehydration solution (ORS), finish the ORS first. Then, start slowly drinking other clear fluids. Take over-the-counter and prescription medicines only as told by your doctor. Get help right away if you have any symptoms of very bad dehydration. This information is not intended to replace advice given to you by your health care provider. Make sure you discuss any questions you have with your health care provider. Document Revised: 04/09/2022 Document Reviewed: 07/14/2019 [employer] Patient Education  2023 [employer]  

## 2023-01-13 NOTE — Telephone Encounter (Signed)
Daughter called and cancelled echocardiogram due to patient has esophageal cancer and will begin radiation on Monday.  They have not wore the monitor and will be sending back. They will follow up when father is well.

## 2023-01-13 NOTE — Telephone Encounter (Signed)
Will route to primary cards to make aware.  Thanks!

## 2023-01-14 ENCOUNTER — Encounter (HOSPITAL_COMMUNITY)
Admission: RE | Admit: 2023-01-14 | Discharge: 2023-01-14 | Disposition: A | Discharge status: Home / Self Care | Payer: Medicare Other | Source: Ambulatory Visit | Attending: Hematology | Admitting: Hematology

## 2023-01-14 ENCOUNTER — Ambulatory Visit
Admission: RE | Admit: 2023-01-14 | Discharge: 2023-01-14 | Disposition: A | Discharge status: Home / Self Care | Payer: Medicare Other | Source: Ambulatory Visit | Attending: Radiation Oncology | Admitting: Radiation Oncology

## 2023-01-14 ENCOUNTER — Inpatient Hospital Stay: Payer: Medicare Other | Admitting: Dietician

## 2023-01-14 ENCOUNTER — Other Ambulatory Visit: Payer: Self-pay

## 2023-01-14 DIAGNOSIS — C155 Malignant neoplasm of lower third of esophagus: Secondary | ICD-10-CM | POA: Insufficient documentation

## 2023-01-14 LAB — GLUCOSE, CAPILLARY: Glucose-Capillary: 106 mg/dL — ABNORMAL HIGH (ref 70–99)

## 2023-01-14 MED ORDER — FLUDEOXYGLUCOSE F - 18 (FDG) INJECTION
7.0000 | Freq: Once | INTRAVENOUS | Status: AC
  Administered 2023-01-14: 6.75 via INTRAVENOUS

## 2023-01-14 NOTE — Progress Notes (Signed)
I met with Mr Martinique and his daughter, Ennis Forts.  I provided hands on instruction in the use of the PEG tube.  We reviewed administration of bolus feedings and water flushes.  He is scheduled for PEG to placement on 2/7.  I will meet with them again on 2/8 so that [name] can demonstrate use and care of the PEG tube

## 2023-01-14 NOTE — Progress Notes (Signed)
I met with Mr Daniel [name] and his daughter, Daniel [name].  I provided hands on instruction in the use of the PEG tube. She demonstrated clamping the tube and attaching syringe. We reviewed administration of bolus feedings and water flushes.  He is scheduled for PEG to placement on 2/7.  I will meet with them again on 2/8 so that Daniel [name] can demonstrate use and care of the PEG tube

## 2023-01-14 NOTE — Progress Notes (Signed)
The proposed treatment discussed in conference is for discussion purpose only and is not a binding recommendation.  The patients have not been physically examined, or presented with their treatment options.  Therefore, final treatment plans cannot be decided.  

## 2023-01-16 ENCOUNTER — Telehealth: Payer: Self-pay | Admitting: Dietician

## 2023-01-16 ENCOUNTER — Inpatient Hospital Stay: Payer: Medicare Other | Admitting: Dietician

## 2023-01-16 DIAGNOSIS — Z931 Gastrostomy status: Secondary | ICD-10-CM | POA: Insufficient documentation

## 2023-01-16 DIAGNOSIS — C9000 Multiple myeloma not having achieved remission: Secondary | ICD-10-CM | POA: Insufficient documentation

## 2023-01-16 DIAGNOSIS — Z23 Encounter for immunization: Secondary | ICD-10-CM | POA: Insufficient documentation

## 2023-01-16 DIAGNOSIS — C155 Malignant neoplasm of lower third of esophagus: Secondary | ICD-10-CM | POA: Insufficient documentation

## 2023-01-16 DIAGNOSIS — D539 Nutritional anemia, unspecified: Secondary | ICD-10-CM | POA: Insufficient documentation

## 2023-01-16 DIAGNOSIS — Z5189 Encounter for other specified aftercare: Secondary | ICD-10-CM | POA: Insufficient documentation

## 2023-01-16 DIAGNOSIS — R1319 Other dysphagia: Secondary | ICD-10-CM | POA: Insufficient documentation

## 2023-01-16 DIAGNOSIS — R1310 Dysphagia, unspecified: Secondary | ICD-10-CM | POA: Insufficient documentation

## 2023-01-16 DIAGNOSIS — I4891 Unspecified atrial fibrillation: Secondary | ICD-10-CM | POA: Insufficient documentation

## 2023-01-16 DIAGNOSIS — Z510 Encounter for antineoplastic radiation therapy: Secondary | ICD-10-CM | POA: Insufficient documentation

## 2023-01-16 DIAGNOSIS — I129 Hypertensive chronic kidney disease with stage 1 through stage 4 chronic kidney disease, or unspecified chronic kidney disease: Secondary | ICD-10-CM | POA: Insufficient documentation

## 2023-01-16 DIAGNOSIS — N184 Chronic kidney disease, stage 4 (severe): Secondary | ICD-10-CM | POA: Insufficient documentation

## 2023-01-16 NOTE — Telephone Encounter (Signed)
Nutrition Assessment   Reason for Assessment: HNC   ASSESSMENT: 89 year old with esophageal cancer. He is pending start of palliative radiation therapy under the care of Dr. Lisbeth Renshaw (first treatment scheduled 2/2).   PEG planned 2/6  Past medical history includes CKD stage 4, gout, hearing loss, HTN, hypothyroid, prostate cancer, tinnitus, DVT   Spoke with daughters of patient via telephone. Patient is not present for call today. Daughters report pt was drinking 3 Ensure Plus, but only able to get down 2 the last couple of days. This takes him all day. Daughter reports the opening to his stomach is half the size of an M&M. He is unable tolerate shakes, ice cream, creamy soups. Milk is reported to be too thick in the last week per daughter. She is making him bone broth this weekend. Patient is eating ice cubes for hydration. He is not drinking water.  Patient is constipated. Daughter reports it was recommended patient have colonoscopy per recent EGD. Recalls pt having bowel movement a week ago. Prior to that, pt went a month without a bowel movement.   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, dietary recall meeting <50% of estimated needs   INTERVENTION:  Suggested pouring Ensure over ice to thin texture  Recommend trying Boost VHC thinned to tolerable consistency to better meet nutrition needs - samples provided Encouraged pt to remain  upright while drinking and avoid laying down for 30 minutes after intake Samples of Unjury protein powder, Prostat, Boost Breeze provided PEG planned for 2/6 -  TF education with pt and daughter will be provided Patient is at risk for refeeding, recommend monitoring Mg/K/Phos - MD to replace as indicated  Contact information given    MONITORING, EVALUATION, GOAL: Patient will tolerate adequate calories and protein to promote stable weight    Next Visit: Thursday February 8 with Pamala Hurry for bolus education/PEG care

## 2023-01-18 ENCOUNTER — Ambulatory Visit
Admission: RE | Admit: 2023-01-18 | Discharge: 2023-01-18 | Disposition: A | Discharge status: Home / Self Care | Payer: Medicare Other | Source: Ambulatory Visit | Attending: Radiation Oncology | Admitting: Radiation Oncology

## 2023-01-18 DIAGNOSIS — I4891 Unspecified atrial fibrillation: Secondary | ICD-10-CM | POA: Diagnosis not present

## 2023-01-18 DIAGNOSIS — C9000 Multiple myeloma not having achieved remission: Secondary | ICD-10-CM | POA: Diagnosis not present

## 2023-01-18 DIAGNOSIS — Z931 Gastrostomy status: Secondary | ICD-10-CM | POA: Diagnosis not present

## 2023-01-18 DIAGNOSIS — D539 Nutritional anemia, unspecified: Secondary | ICD-10-CM | POA: Diagnosis not present

## 2023-01-18 DIAGNOSIS — C155 Malignant neoplasm of lower third of esophagus: Secondary | ICD-10-CM | POA: Insufficient documentation

## 2023-01-18 DIAGNOSIS — R1319 Other dysphagia: Secondary | ICD-10-CM | POA: Diagnosis not present

## 2023-01-18 DIAGNOSIS — Z23 Encounter for immunization: Secondary | ICD-10-CM | POA: Diagnosis not present

## 2023-01-18 DIAGNOSIS — Z510 Encounter for antineoplastic radiation therapy: Secondary | ICD-10-CM | POA: Diagnosis not present

## 2023-01-18 DIAGNOSIS — N184 Chronic kidney disease, stage 4 (severe): Secondary | ICD-10-CM | POA: Diagnosis not present

## 2023-01-18 DIAGNOSIS — I129 Hypertensive chronic kidney disease with stage 1 through stage 4 chronic kidney disease, or unspecified chronic kidney disease: Secondary | ICD-10-CM | POA: Diagnosis not present

## 2023-01-19 ENCOUNTER — Other Ambulatory Visit: Payer: Self-pay

## 2023-01-19 ENCOUNTER — Other Ambulatory Visit: Payer: Self-pay | Admitting: Radiology

## 2023-01-19 DIAGNOSIS — C159 Malignant neoplasm of esophagus, unspecified: Secondary | ICD-10-CM

## 2023-01-19 DIAGNOSIS — Z510 Encounter for antineoplastic radiation therapy: Secondary | ICD-10-CM | POA: Diagnosis not present

## 2023-01-19 LAB — RAD ONC ARIA SESSION SUMMARY
Course Elapsed Days: 0
Plan Fractions Treated to Date: 1
Plan Prescribed Dose Per Fraction: 1.8 Gy
Plan Total Fractions Prescribed: 25
Plan Total Prescribed Dose: 45 Gy
Reference Point Dosage Given to Date: 1.8 Gy
Reference Point Session Dosage Given: 1.8 Gy
Session Number: 1

## 2023-01-19 NOTE — H&P (Signed)
Chief Complaint: Patient was seen in consultation today for esophageal cancer; gastrostomy tube placement.   Referring Physician(s): Feng,Yan  Supervising Physician: {Supervising Physician:[id number]}  Patient Status: [facility] - Out-pt  History of Present Illness: Daniel [name] is a 89 y.o. 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 He was referred to GI for dysphagia and unintentional weight loss. Imaging including an EGD revealed a completely obstructing and circumferential mass in the distal esophagus. Pathology returned positive for adenocarcinoma.   His oncology team is preparing him for palliative radiation therapy with the goal to improve his quality of life and enable him to eat food again. He has been on a liquid only diet for the past few months and is suffering from moderate protein and calorie  malnutrition.   Interventional Radiology has been asked to evaluate this patient for an image-guided gastrostomy tube to facilitate his nutritional requirements.   Past Medical History:  Diagnosis Date   Blood transfusion    CKD (chronic kidney disease) stage 4, GFR 15-29 ml/min (HCC)    DVT (deep venous thrombosis) (HCC)    from surgery, no longer on Eliquis   Gout    Hearing loss    Hypertension    Hypertriglyceridemia    Hypothyroid    Prediabetes    Prostate cancer [facility])    Pulmonary nodule    Tinnitus     Past Surgical History:  Procedure Laterality Date   BIOPSY  01/05/2023   Procedure: BIOPSY;  Surgeon: Irving Copas., MD;  Location: [facility];  Service: Gastroenterology;;   CATARACT EXTRACTION Bilateral 2020   ESOPHAGOGASTRODUODENOSCOPY (EGD) WITH PROPOFOL N/A 01/05/2023   Procedure: ESOPHAGOGASTRODUODENOSCOPY (EGD) WITH PROPOFOL;  Surgeon: Irving Copas., MD;  Location: [facility];  Service: Gastroenterology;   Laterality: N/A;   FOREIGN BODY REMOVAL  01/05/2023   Procedure: FOREIGN BODY REMOVAL;  Surgeon: Irving Copas., MD;  Location: [facility];  Service: Gastroenterology;;   HERNIA REPAIR Right 2010   left inguinal hernia repair  06/29/2008   with mesh sail   PROSTATECTOMY  1998   prostate cancer   right ankle  1980   fracture   right knee  1980   torn meniscus   TOTAL KNEE ARTHROPLASTY Right 01/20/2022   Procedure: TOTAL KNEE ARTHROPLASTY;  Surgeon: Vickey Huger, MD;  Location: [facility];  Service: Orthopedics;  Laterality: Right;    Allergies: Patient has no known allergies.  Medications: Prior to Admission medications   Medication Sig Start Date End Date Taking? Authorizing Provider  allopurinol (ZYLOPRIM) 100 MG tablet Take 100 mg by mouth every Monday, Wednesday, and Friday. 12/02/21   [provider]  ALPRAZolam Duanne Moron) 0.5 MG tablet Take 0.5 mg by mouth 3 (three) times daily as needed for sleep (tremors).    [provider]  amLODipine (NORVASC) 5 MG tablet Take 5 mg by mouth daily. 12/02/21   [provider]  apixaban (ELIQUIS) 2.5 MG TABS tablet Take 1 tablet (2.5 mg total) by mouth 2 (two) times daily. 01/07/23   Mansouraty, Telford Nab., MD  cholecalciferol (VITAMIN D) 1000 units tablet Take 1,000 Units by mouth daily.    [provider]  levothyroxine (SYNTHROID, LEVOTHROID) 100 MCG tablet Take 100 mcg by mouth daily before breakfast. 08/17/18   [provider]  metoprolol succinate (TOPROL XL) 25 MG 24 hr tablet Take 0.5 tablets (12.5 mg total) by mouth daily.  12/24/22   Tobb, Kardie, DO  omeprazole (PRILOSEC) 40 MG capsule Open 1 capsule and sprinkle in apple sauce twice daily. 12/23/22   Mansouraty, Telford Nab., MD  sucralfate (CARAFATE) 1 GM/10ML suspension Take 10 mLs (1 g total) by mouth 2 (two) times daily. 01/05/23 01/05/24  Mansouraty, Telford Nab., MD     Family History  Problem Relation Age of Onset   Heart disease  Mother    Heart disease Father    Cancer Sister        breast cancer   Heart disease Brother    Prostate cancer Brother    Colon cancer Neg Hx    Esophageal cancer Neg Hx    Inflammatory bowel disease Neg Hx    Liver disease Neg Hx    Pancreatic cancer Neg Hx    Rectal cancer Neg Hx    Stomach cancer Neg Hx     Social History   Socioeconomic History   Marital status: Widowed    Spouse name: Not on file   Number of children: 5   Years of education: Not on file   Highest education level: Not on file  Occupational History   Occupation: retired Actor  Tobacco Use   Smoking status: Former    Packs/day: 1.00    Years: 20.00    Total pack years: 20.00    Types: Cigarettes   Smokeless tobacco: Not on file  Vaping Use   Vaping Use: Never used  Substance and Sexual Activity   Alcohol use: Yes    Comment: used to drink moderately,   Drug use: No   Sexual activity: Not on file  Other Topics Concern   Not on file  Social History Narrative   Not on file   Social Determinants of Health   Financial Resource Strain: Not on file  Food Insecurity: Not on file  Transportation Needs: Not on file  Physical Activity: Not on file  Stress: Not on file  Social Connections: Not on file    Review of Systems: A 12 point ROS discussed and pertinent positives are indicated in the HPI above.  All other systems are negative.  Review of Systems  Vital Signs: There were no vitals taken for this visit.  Physical Exam  Imaging: NM PET Image Initial (PI) Skull Base To Thigh  Result Date: 01/14/2023 CLINICAL DATA:  Initial treatment strategy for esophageal cancer. EXAM: NUCLEAR MEDICINE PET SKULL BASE TO THIGH TECHNIQUE: 6.75 mCi F-18 FDG was injected intravenously. Full-ring PET imaging was performed from the skull base to thigh after the radiotracer. CT data was obtained and used for attenuation correction and anatomic localization. Fasting blood glucose: 106 mg/dl COMPARISON:   CT chest, abdomen and pelvis dated January 07 2023 FINDINGS: Mediastinal blood pool activity: SUV max 2.5 Liver activity: SUV max NA NECK: No hypermetabolic lymph nodes in the neck. Incidental CT findings: None. CHEST: Focal thickening of the distal esophagus with associated hypermetabolic uptake, SUV max of 9.8. Hypermetabolic mediastinal and bilateral hilar lymph nodes. Reference right hilar lymph node measuring 1.1 cm in short axis on series 4, image 76 with SUV max of 3.7. Reference subcarinal lymph node measuring 1.0 cm in short axis on series 4, image 71 with SUV max of 4.7. Incidental CT findings: Scattered small pulmonary nodules which are unchanged when compared with prior exam and below resolution for PET-CT. Reference nodule of the right middle lobe measuring 5 mm on series 7, image 48. Severe coronary artery calcifications. Normal caliber  thoracic aorta with moderate calcified plaque. ABDOMEN/PELVIS: More intense focal uptake seen in the in the distal right colon on series 604, image 124 with SUV max of 6.5. No abnormal hypermetabolic activity within the liver, pancreas, adrenal glands, or spleen. No hypermetabolic lymph nodes in the abdomen or pelvis. Incidental CT findings: Area of lobulation involving the mid kidney with similar uptake to background renal activity, possibly dromedary hump, although underlying renal mass can not be excluded. Normal caliber abdominal aorta with moderate to severe calcified plaque. Prior prostatectomy. SKELETON: Focal hypermetabolic uptake seen at the right pubic bone with no definite CT abnormality, SUV max of 7.1. Small sclerotic lesion of the anterior left sixth rib with SUV max of 3.0. Incidental CT findings: Advanced degenerative changes of the right shoulder. Severe L1 compression deformity with no associated hypermetabolic activity. IMPRESSION: 1. Focal thickening of the distal esophagus with associated hypermetabolic uptake, compatible with primary esophageal  malignancy. 2. Hypermetabolic mediastinal and bilateral hilar lymph nodes, possibly reactive, although metastatic disease is also a concern. 3. Focal hypermetabolic uptake seen at the right pubic bone and anterior left sixth rib, concerning for osseous metastatic disease. 4. Focal uptake seen in the distal right colon, possibly physiologic, although colonic malignancy could also have this appearance. Recommend colonoscopy for further evaluation. 5. Area of lobulation involving the mid right kidney with similar uptake to background renal activity, possibly a dromedary hump, although underlying renal mass can not be excluded. Recommend renal ultrasound for further evaluation. 6. Aortic Atherosclerosis (ICD10-I70.0). Electronically Signed   By: Yetta Glassman M.D.   On: 01/14/2023 14:23   CT CHEST ABDOMEN PELVIS WO CONTRAST  Result Date: 01/07/2023 CLINICAL DATA:  Distal esophageal adenocarcinoma, staging workup * Tracking Code: BO * EXAM: CT CHEST, ABDOMEN AND PELVIS WITHOUT CONTRAST TECHNIQUE: Multidetector CT imaging of the chest, abdomen and pelvis was performed following the standard protocol without IV contrast. RADIATION DOSE REDUCTION: This exam was performed according to the departmental dose-optimization program which includes automated exposure control, adjustment of the mA and/or kV according to patient size and/or use of iterative reconstruction technique. COMPARISON:  Esophagram 12/29/2022 FINDINGS: CT CHEST FINDINGS Cardiovascular: Coronary, aortic arch, and branch vessel atherosclerotic vascular disease. Mitral and aortic valve calcification. Mediastinum/Nodes: Dilated and contrast filled esophagus extending down to the distal esophageal mass which extends over about a 3.9 cm vertical excursion, with resulting stricturing as shown on image 67 series 7. Small type 1 hiatal hernia along the lower margin of the mass. Small mediastinal lymph nodes are not pathologically enlarged. Lungs/Pleura: Central  airway thickening. 0.5 cm sub solid nodule in the right middle lobe on image 121 series 5. Subpleural reticulation along portions of the lower lobes. Musculoskeletal: Markedly severe degenerative glenohumeral arthropathy on the right with proliferative bone spurring noted along the articulation of the below the coracoid. Moderate degenerative glenohumeral arthropathy on the left. Thoracic and lower cervical spondylosis. CT ABDOMEN PELVIS FINDINGS Hepatobiliary: The borderline gallbladder wall thickening. No hepatic parenchymal lesion identified, lack of IV contrast can lower sensitivity. Pancreas: Unremarkable Spleen: Unremarkable Adrenals/Urinary Tract: 6 mm left kidney lower pole nonobstructive renal calculus. Punctate calcifications along the right renal hilum are likely vascular. No hydronephrosis or hydroureter. Nonspecific lobulation of the left mid kidney is shown on image 70 of series 3 this could be incidental or due to CT equivocal end of a dromedary hump but I cannot exclude an underlying left mid kidney mass. Stomach/Bowel: There is fat deposition in the wall of the ascending colon which may  be incidental but which can have a weak association with inflammatory bowel disease. There is potential wall thickening in the ascending colon extending to the mid transverse colon which could be inflammatory although a component may be from nondistention. The remainder of the colon appears unremarkable. No discrete small bowel abnormality observed. Vascular/Lymphatic: Atherosclerosis is present, including aortoiliac atherosclerotic disease. There is atheromatous plaque at the origin of the celiac artery and substantial atheromatous plaque proximally in the superior mesenteric artery, vascular patency not assessed on today's noncontrast exam. No discrete pathologic adenopathy identified. Prior pelvic lymph node dissection Reproductive: Prostatectomy. Other: No supplemental non-categorized findings. Musculoskeletal:  L1 compression fracture with 90% anterior compression and up to 6 mm of posterior bony retropulsion, likely late subacute chronicity. Grade 1 degenerative retrolisthesis at L 3 4, with 5 mm of grade 1 anterolisthesis at L4-5. Lumbar spondylosis and degenerative disc disease contributing to multilevel impingement. IMPRESSION: 1. Distal esophageal stricture extending over about a 3.9 cm vertical excursion. No findings of metastatic disease to the chest, abdomen, or pelvis. 2. 0.5 cm sub solid nodule in the right middle lobe. Fleischner guidelines do not apply due to history of malignancy. Surveillance of the context of the patient's follow up oncology imaging is suggested. 3. Nonspecific lobulation of the left mid kidney. This could be incidental or due to CT equivocal end of a dromedary hump but I cannot exclude an underlying left mid kidney mass. This could be surveilled in the context of the patient's follow up oncology imaging, or if clinically warranted, renal MRI with and without contrast could be utilized for further characterization. 4. L1 compression fracture with 90% anterior compression and 6 mm of posterior bony retropulsion, likely late subacute chronicity. 5. Wall thickening in the proximal colon extending to the transverse colon, cannot exclude proximal colitis. 6. Other imaging findings of potential clinical significance: Coronary, aortic arch, and branch vessel atherosclerotic disease. Mitral and aortic valve calcification. Small type 1 hiatal hernia along the lower margin of the mass. Central airway thickening is present, suggesting bronchitis or reactive airways disease. Fatty deposition in the wall of the ascending colon may be incidental but which can have a weak association with inflammatory bowel disease. Lumbar spondylosis and degenerative disc disease contributing to multilevel impingement. 6 mm left kidney lower pole nonobstructive renal calculus. Aortic Atherosclerosis (ICD10-I70.0).  Electronically Signed   By: Van Clines M.D.   On: 01/07/2023 11:55   DG ESOPHAGUS W SINGLE CM (SOL OR THIN BA)  Result Date: 12/29/2022 CLINICAL DATA:  Dysphagia, unspecified type. Unintentional weight loss. History of anemia. Gastroesophageal reflux disease, unspecified whether esophagitis present. Atrial fibrillation, unspecified type. GERD. EXAM: ESOPHAGUS/BARIUM SWALLOW/TABLET STUDY TECHNIQUE: A single contrast examination was performed using thin liquid barium. The exam was performed by Narda Rutherford, NP, and was supervised and interpreted by Dr. Kellie Simmering. FLUOROSCOPY: Fluoroscopy time: 1 minute, 30 seconds (14.2 mGy). COMPARISON:  Abdominal radiographs 12/23/2022. FINDINGS: There is a segment of marked narrowing within the distal esophagus with shouldered and somewhat irregular margins. There is delayed and intermittent passage of contrast at this site (with incomplete esophageal clearance). Moderate intermittent esophageal dysmotility with tertiary contractions. Small sliding hiatal hernia. Incomplete esophageal clearance limits evaluation for gastroesophageal reflux. Impression #1 will be called to the ordering clinician or representative by the Radiologist Assistant, and communication documented in the PACS or [employer]. IMPRESSION: 1. Segment of high-grade narrowing within the distal esophagus with shouldered and somewhat irregular margins. This is compatible with an esophageal stricture, and the  constellation of findings is highly suspicious for an esophageal malignancy. Endoscopy and/or a contrast-enhanced chest CT should be considered for further evaluation. Delayed and intermittent passage of contrast at this site (with incomplete esophageal clearance). 2. Moderate esophageal dysmotility with tertiary contractions. 3. Small sliding hiatal hernia. Electronically Signed   By: Kellie Simmering D.O.   On: 12/29/2022 11:17   DG Abd 2 Views  Result Date: 12/23/2022 CLINICAL DATA:  Weight  loss, GERD, and dysphagia. EXAM: ABDOMEN - 2 VIEW COMPARISON:  None Available. FINDINGS: Possible small stone in the lower pole of the left kidney versus bowel contents or overlying soft tissue calcification. No other renal or ureteral stones identified. Surgical clips are identified in the pelvis. No free air, portal venous gas, or pneumatosis. Moderate fecal loading in the ascending colon. Vascular calcifications in the lower extremities. No other bony or soft tissue abnormalities. IMPRESSION: 1. Possible small stone in the lower pole of the left kidney versus bowel contents or overlying soft tissue calcification. 2. Moderate fecal loading in the ascending colon. 3. Vascular calcifications. Electronically Signed   By: Dorise Bullion III M.D.   On: 12/23/2022 18:23    Labs:  CBC: Recent Labs    01/20/22 0640 01/21/22 0330 12/23/22 1036 01/09/23 1433  WBC 9.2 13.5* 9.2 9.0  HGB 12.3* 10.3* 14.0 13.9  HCT 35.7* 30.5* 41.5 39.6  PLT 277 240 316.0 217    COAGS: Recent Labs    12/23/22 1036  INR 1.0    BMP: Recent Labs    01/20/22 0640 01/21/22 0330 12/23/22 1036 01/09/23 1433  NA 138 138 142 141  K 3.8 4.4 4.4 4.7  CL 105 107 104 107  CO2 '23 23 28 29  '$ GLUCOSE 116* 132* 106* 164*  BUN 35* 33* 25* 42*  CALCIUM 9.0 8.4* 9.5 9.6  CREATININE 2.27* 2.14* 1.90* 2.03*  GFRNONAA 26* 28*  --  30*    LIVER FUNCTION TESTS: Recent Labs    01/20/22 0640 12/23/22 1036 01/09/23 1433  BILITOT 0.7 1.0 0.5  AST '21 19 23  '$ ALT '11 7 13  '$ ALKPHOS 70 71 74  PROT 6.2* 6.6 5.8*  ALBUMIN 3.6 4.1 3.5    TUMOR MARKERS: Recent Labs    01/09/23 1433  CEA 1.63    Assessment and Plan:  Obstructing esophageal adenocarcinoma; protein and calorie malnutrition: Daniel [name], 89 year old male, presents today to the [facility] department for an image-guided gastrostomy tube placement.  Risks and benefits image guided gastrostomy tube placement was discussed with  the patient including, but not limited to the need for a barium enema during the procedure, bleeding, infection, peritonitis and/or damage to adjacent structures.  All of the patient's questions were answered, patient is agreeable to proceed. He has been NPO. He drank the required amount of barium last night. His last dose of Eliquis was 01/17/23***  Consent signed and in chart.  Thank you for this interesting consult.  I greatly enjoyed meeting Daniel [name] and look forward to participating in their care.  A copy of this report was sent to the requesting provider on this date.  Electronically Signed: Soyla Dryer, AGACNP-BC [phone] 01/19/2023, 2:04 PM   I spent a total of  30 Minutes   in face to face in clinical consultation, greater than 50% of which was counseling/coordinating care for gastrostomy tube placement.

## 2023-01-20 ENCOUNTER — Encounter (HOSPITAL_COMMUNITY): Payer: Self-pay

## 2023-01-20 ENCOUNTER — Ambulatory Visit
Admission: RE | Admit: 2023-01-20 | Discharge: 2023-01-20 | Disposition: A | Discharge status: Home / Self Care | Payer: Medicare Other | Source: Ambulatory Visit | Attending: Radiation Oncology | Admitting: Radiation Oncology

## 2023-01-20 ENCOUNTER — Other Ambulatory Visit: Payer: Self-pay

## 2023-01-20 ENCOUNTER — Ambulatory Visit (HOSPITAL_COMMUNITY)
Admission: RE | Admit: 2023-01-20 | Discharge: 2023-01-20 | Disposition: A | Discharge status: Home / Self Care | Payer: Medicare Other | Source: Ambulatory Visit | Attending: Hematology | Admitting: Hematology

## 2023-01-20 ENCOUNTER — Other Ambulatory Visit (HOSPITAL_COMMUNITY): Payer: Medicare Other

## 2023-01-20 DIAGNOSIS — Z803 Family history of malignant neoplasm of breast: Secondary | ICD-10-CM | POA: Diagnosis not present

## 2023-01-20 DIAGNOSIS — R1319 Other dysphagia: Secondary | ICD-10-CM | POA: Insufficient documentation

## 2023-01-20 DIAGNOSIS — C159 Malignant neoplasm of esophagus, unspecified: Secondary | ICD-10-CM | POA: Insufficient documentation

## 2023-01-20 DIAGNOSIS — Z6820 Body mass index (BMI) 20.0-20.9, adult: Secondary | ICD-10-CM | POA: Diagnosis not present

## 2023-01-20 DIAGNOSIS — Z8042 Family history of malignant neoplasm of prostate: Secondary | ICD-10-CM | POA: Insufficient documentation

## 2023-01-20 DIAGNOSIS — Z510 Encounter for antineoplastic radiation therapy: Secondary | ICD-10-CM | POA: Diagnosis not present

## 2023-01-20 DIAGNOSIS — Z87891 Personal history of nicotine dependence: Secondary | ICD-10-CM | POA: Insufficient documentation

## 2023-01-20 DIAGNOSIS — E46 Unspecified protein-calorie malnutrition: Secondary | ICD-10-CM | POA: Insufficient documentation

## 2023-01-20 HISTORY — PX: IR NASO G TUBE PLC W/FL W/RAD: IMG2321

## 2023-01-20 HISTORY — PX: IR GASTROSTOMY TUBE MOD SED: IMG625

## 2023-01-20 LAB — RAD ONC ARIA SESSION SUMMARY
Course Elapsed Days: 1
Plan Fractions Treated to Date: 2
Plan Prescribed Dose Per Fraction: 1.8 Gy
Plan Total Fractions Prescribed: 25
Plan Total Prescribed Dose: 45 Gy
Reference Point Dosage Given to Date: 3.6 Gy
Reference Point Session Dosage Given: 1.8 Gy
Session Number: 2

## 2023-01-20 LAB — BASIC METABOLIC PANEL
Anion gap: 11 (ref 5–15)
BUN: 34 mg/dL — ABNORMAL HIGH (ref 8–23)
CO2: 23 mmol/L (ref 22–32)
Calcium: 9 mg/dL (ref 8.9–10.3)
Chloride: 106 mmol/L (ref 98–111)
Creatinine, Ser: 1.9 mg/dL — ABNORMAL HIGH (ref 0.61–1.24)
GFR, Estimated: 32 mL/min — ABNORMAL LOW (ref >60)
Glucose, Bld: 112 mg/dL — ABNORMAL HIGH (ref 70–99)
Potassium: 4.2 mmol/L (ref 3.5–5.1)
Sodium: 140 mmol/L (ref 135–145)

## 2023-01-20 LAB — CBC WITH DIFFERENTIAL/PLATELET
Abs Immature Granulocytes: 0.03 10*3/uL (ref 0.00–0.07)
Basophils Absolute: 0 10*3/uL (ref 0.0–0.1)
Basophils Relative: 1 %
Eosinophils Absolute: 0.2 10*3/uL (ref 0.0–0.5)
Eosinophils Relative: 3 %
HCT: 39.3 % (ref 39.0–52.0)
Hemoglobin: 13.3 g/dL (ref 13.0–17.0)
Immature Granulocytes: 0 %
Lymphocytes Relative: 16 %
Lymphs Abs: 1.1 10*3/uL (ref 0.7–4.0)
MCH: 34.6 pg — ABNORMAL HIGH (ref 26.0–34.0)
MCHC: 33.8 g/dL (ref 30.0–36.0)
MCV: 102.3 fL — ABNORMAL HIGH (ref 80.0–100.0)
Monocytes Absolute: 0.3 10*3/uL (ref 0.1–1.0)
Monocytes Relative: 5 %
Neutro Abs: 5.2 10*3/uL (ref 1.7–7.7)
Neutrophils Relative %: 75 %
Platelets: 232 10*3/uL (ref 150–400)
RBC: 3.84 MIL/uL — ABNORMAL LOW (ref 4.22–5.81)
RDW: 12.9 % (ref 11.5–15.5)
WBC: 6.9 10*3/uL (ref 4.0–10.5)
nRBC: 0 % (ref 0.0–0.2)

## 2023-01-20 LAB — PROTIME-INR
INR: 1.1 (ref 0.8–1.2)
Prothrombin Time: 14.4 seconds (ref 11.4–15.2)

## 2023-01-20 MED ORDER — FENTANYL CITRATE (PF) 100 MCG/2ML IJ SOLN
INTRAMUSCULAR | Status: AC
  Filled 2023-01-20: qty 2

## 2023-01-20 MED ORDER — LIDOCAINE-EPINEPHRINE 1 %-1:100000 IJ SOLN: 20.0000 mL | Freq: Once | INTRAMUSCULAR | Status: DC

## 2023-01-20 MED ORDER — OXYCODONE HCL 5 MG/5ML PO SOLN: 5.0000 mg | ORAL | Status: DC | PRN

## 2023-01-20 MED ORDER — CEFAZOLIN SODIUM-DEXTROSE 2-4 GM/100ML-% IV SOLN: 2.0000 g | INTRAVENOUS | Status: AC

## 2023-01-20 MED ORDER — LIDOCAINE-EPINEPHRINE 1 %-1:100000 IJ SOLN
INTRAMUSCULAR | Status: AC
  Administered 2023-01-20: 15 mL
  Filled 2023-01-20: qty 1

## 2023-01-20 MED ORDER — FENTANYL CITRATE (PF) 100 MCG/2ML IJ SOLN
INTRAMUSCULAR | Status: AC | PRN
  Administered 2023-01-20 (×2): 25 ug via INTRAVENOUS

## 2023-01-20 MED ORDER — CEFAZOLIN SODIUM-DEXTROSE 2-4 GM/100ML-% IV SOLN
INTRAVENOUS | Status: AC
  Administered 2023-01-20: 2 g via INTRAVENOUS
  Filled 2023-01-20: qty 100

## 2023-01-20 MED ORDER — OXYCODONE HCL 5 MG/5ML PO SOLN: 5.0000 mg | Freq: Three times a day (TID) | ORAL | 0 refills | Status: DC

## 2023-01-20 MED ORDER — MIDAZOLAM HCL 2 MG/2ML IJ SOLN
INTRAMUSCULAR | Status: AC
  Filled 2023-01-20: qty 2

## 2023-01-20 MED ORDER — MIDAZOLAM HCL 2 MG/2ML IJ SOLN
INTRAMUSCULAR | Status: AC | PRN
  Administered 2023-01-20 (×2): .5 mg via INTRAVENOUS

## 2023-01-20 MED ORDER — LIDOCAINE VISCOUS HCL 2 % MT SOLN: 15.0000 mL | Freq: Once | OROMUCOSAL | Status: AC

## 2023-01-20 MED ORDER — LIDOCAINE VISCOUS HCL 2 % MT SOLN
OROMUCOSAL | Status: AC
  Administered 2023-01-20: 10 mL via ORAL
  Filled 2023-01-20: qty 15

## 2023-01-20 MED ORDER — IOHEXOL 300 MG/ML  SOLN
50.0000 mL | Freq: Once | INTRAMUSCULAR | Status: AC | PRN
  Administered 2023-01-20: 15 mL

## 2023-01-20 MED ORDER — SODIUM CHLORIDE 0.9 % IV SOLN: INTRAVENOUS | Status: DC

## 2023-01-20 NOTE — Procedures (Signed)
Interventional Radiology Procedure:   Indications: Esophageal cancer  Procedure: Gastrostomy tube placement  Findings: 16 Fr Entuit balloon retention tube placed.    Complications: No immediate complications noted.     EBL: Minimal  Plan: May use gastrostomy tube for medications today.  May use for feeds tomorrow.     Daniel [name] R. Anselm Pancoast, MD  Pager: [phone]

## 2023-01-20 NOTE — Discharge Instructions (Addendum)
Please call Interventional Radiology clinic [phone] with any questions or concerns.  Also contact Oncology Nurse [place], RN or Dr. [facility]  [phone] with any questions or concerns.  Please follow clear liquid diet until 12:00 tomorrow, then advance diet as tolerated.   Gastrostomy Tube Home Guide, Adult A gastrostomy tube, or G-tube, is a tube that is inserted through the abdomen into the stomach. The tube is used to give feedings and medicines when a person cannot eat and drink enough on his or her own or take medicines by mouth. How to care for the insertion site  Supplies needed: Saline solution or clean, warm water and soap. Saline solution is made of salt and water. Cotton swab or gauze. Pre-cut gauze bandage (dressing) and tape, if needed. Instructions Follow these steps daily to clean the insertion site: Wash your hands with soap and water for at least 20 seconds. Remove the dressing (if there is one) that is between the person's skin and the tube. Check the area where the tube enters the skin. Check daily for problems such as: Redness, rash, or irritation. Swelling. Pus-like drainage. Extra skin growth. Moisten the cotton swab or gauze with the saline solution or with a soap-and-water mixture. Gently clean around the insertion site. Remove any drainage or crusted material. When the G-tube is first put in, a normal saline solution or water can be used to clean the skin. After the skin around the tube has healed, mild soap and water may be used. Apply a dressing (if there should be one) between the person's skin and the tube. How to flush a G-tube Flush the G-tube regularly to keep it from clogging. Flush it before and after feedings and as often as told by the health care provider. Supplies needed: Purified or germ-free (sterile) water, warmed. Container with lid for boiling water, if needed. 60 cc G-tube  syringe. Instructions Before you begin, decide whether to use sterile water or purified drinking water. Use only sterile water if: The person has a weak disease-fighting (immune) system. The person has trouble fighting off infections (is immunocompromised). You are unsure about the amount of chemical contaminants in purified or drinking water. Use purified drinking water in all other cases. To purify drinking water by boiling: Boil water for at least 1 minute. Keep lid over water while it boils. Let water cool to room temperature before using. Follow these steps to flush the G-tube: Wash your hands with soap and water for at least 20 seconds. Bring out (draw up) 30 mL of warm water in a syringe. Connect the syringe to the tube. Slowly and gently push the water into the tube. General tips If the tube comes out: Cover the opening with a clean dressing and tape. Get help right away. If there is skin or scar tissue growing where the tube enters the skin: Keep the area clean and dry. Secure the tube with tape so that the tube does not move around too much. If the tube gets clogged: Slowly push warm water into the tube with a large syringe. Do not force the fluid into the tube or push an object into the tube. Get help right away if you cannot unclog the tube. Follow these instructions at home: Feedings Give feedings at room temperature. If feedings are continuous: Do not put more than 4 hours' worth of feedings in the feeding bag. Stop the feedings when you need to give medicine or flush the tube. Be  sure to restart the feedings. Make sure the person's head is above his or her stomach (upright position). This will prevent choking and discomfort. Make sure the person is in the right position during and after feedings. During feedings, have the person in the upright position. After a non-continuous feeding (bolus feeding), have the person stay in the upright position for 1 hour. Cover and  place unused feedings in the refrigerator. Replace feeding bags and syringes as told. Good hygiene Make sure the person takes good care of his or her mouth and teeth (oral hygiene), such as by brushing his or her teeth. Keep the area where the tube enters the skin clean and dry. General instructions Do not pull or put tension on the tube. Before you remove the tube cap or disconnect a syringe, close the tube by using a clamp (clamping) or bending (kinking) the tube. Measure the length of the G-tube every day from the insertion site to the end of the tube. If the person's G-tube has a balloon, check the fluid in the balloon every week. Check the manufacturer's specifications to find the amount of fluid that should be in the balloon. Remove excess air from the G-tube as told. This is called venting. Do not push feedings, medicines, or flushes fast. Use the feeding tube equipment, such as syringes and connectors, only as told by your health care provider. Contact a health care provider if: The person with the tube has constipation or a fever. A large amount of fluid or mucus-like liquid is leaking from the tube. Skin or scar tissue appears to be growing where the tube enters the skin. The length of tube from the insertion site to the G-tube gets longer. Get help right away if: The person with the tube has any of these problems: Severe pain, tenderness, or bloating in the abdomen. Nausea or vomiting. Trouble breathing or shortness of breath. Any of these problems happen in the area where the tube enters the skin: Redness, irritation, swelling, or soreness. Pus-like discharge. A bad smell. The tube is clogged and cannot be flushed. The tube comes out. The tube will need to be put back in within 4 hours. Summary A gastrostomy tube, or G-tube, is a tube that is inserted through the abdomen into the stomach. The tube is used to give feedings and medicines when a person cannot eat and drink  enough on his or her own or cannot take medicine by mouth. Check and clean the insertion site daily as told by the person's health care provider. Flush the G-tube regularly to keep it from clogging. Flush it before and after feedings and as often as told. Keep the area where the tube enters the skin clean and dry. This information is not intended to replace advice given to you by your health care provider. Make sure you discuss any questions you have with your health care provider. Document Revised: 11/21/2020 Document Reviewed: 04/19/2020 Elsevier Patient Education  [place] DIET until 12:00pm tomorrow then advace diet as tolerated.   Foods Allowed                                                                     Foods Excluded  Coffee and  tea, regular and decaf                             liquids that you cannot  Plain Jell-O any favor except red or purple                                           see through such as: Fruit ices (not with fruit pulp)                                     milk, soups, orange juice  Iced Popsicles                                    All solid food Carbonated beverages, regular and diet                                    Cranberry, grape and apple juices Sports drinks like Gatorade Lightly seasoned clear broth or consume(fat free) Sugar  Sample Menu Breakfast                                Lunch                                     Supper Cranberry juice                    Beef broth                            Chicken broth Jell-O                                     Grape juice                           Apple juice Coffee or tea                        Jell-O                                      Popsicle                                                Coffee or tea                        Coffee or tea  _____________________________________________________________________

## 2023-01-21 ENCOUNTER — Encounter (HOSPITAL_COMMUNITY): Admission: RE | Admit: 2023-01-21 | Payer: Medicare Other | Source: Ambulatory Visit

## 2023-01-21 ENCOUNTER — Ambulatory Visit
Admission: RE | Admit: 2023-01-21 | Discharge: 2023-01-21 | Disposition: A | Discharge status: Home / Self Care | Payer: Medicare Other | Source: Ambulatory Visit | Attending: Radiation Oncology | Admitting: Radiation Oncology

## 2023-01-21 ENCOUNTER — Other Ambulatory Visit: Payer: Medicare Other

## 2023-01-21 ENCOUNTER — Other Ambulatory Visit: Payer: Self-pay

## 2023-01-21 ENCOUNTER — Other Ambulatory Visit: Payer: Self-pay | Admitting: Hematology

## 2023-01-21 DIAGNOSIS — Z510 Encounter for antineoplastic radiation therapy: Secondary | ICD-10-CM | POA: Diagnosis not present

## 2023-01-21 DIAGNOSIS — C159 Malignant neoplasm of esophagus, unspecified: Secondary | ICD-10-CM

## 2023-01-21 DIAGNOSIS — R1319 Other dysphagia: Secondary | ICD-10-CM

## 2023-01-21 LAB — RAD ONC ARIA SESSION SUMMARY
Course Elapsed Days: 2
Plan Fractions Treated to Date: 3
Plan Prescribed Dose Per Fraction: 1.8 Gy
Plan Total Fractions Prescribed: 25
Plan Total Prescribed Dose: 45 Gy
Reference Point Dosage Given to Date: 5.4 Gy
Reference Point Session Dosage Given: 1.8 Gy
Session Number: 3

## 2023-01-22 ENCOUNTER — Encounter: Payer: Medicare Other | Admitting: Nutrition

## 2023-01-22 ENCOUNTER — Inpatient Hospital Stay: Payer: Medicare Other | Admitting: Nutrition

## 2023-01-22 ENCOUNTER — Ambulatory Visit
Admission: RE | Admit: 2023-01-22 | Discharge: 2023-01-22 | Disposition: A | Discharge status: Home / Self Care | Payer: Medicare Other | Source: Ambulatory Visit | Attending: Radiation Oncology | Admitting: Radiation Oncology

## 2023-01-22 ENCOUNTER — Other Ambulatory Visit: Payer: Self-pay

## 2023-01-22 ENCOUNTER — Inpatient Hospital Stay: Payer: Medicare Other

## 2023-01-22 ENCOUNTER — Ambulatory Visit (HOSPITAL_BASED_OUTPATIENT_CLINIC_OR_DEPARTMENT_OTHER): Payer: Medicare Other

## 2023-01-22 DIAGNOSIS — C159 Malignant neoplasm of esophagus, unspecified: Secondary | ICD-10-CM

## 2023-01-22 DIAGNOSIS — I480 Paroxysmal atrial fibrillation: Secondary | ICD-10-CM | POA: Insufficient documentation

## 2023-01-22 DIAGNOSIS — Z510 Encounter for antineoplastic radiation therapy: Secondary | ICD-10-CM | POA: Diagnosis not present

## 2023-01-22 DIAGNOSIS — R1319 Other dysphagia: Secondary | ICD-10-CM

## 2023-01-22 LAB — RAD ONC ARIA SESSION SUMMARY
Course Elapsed Days: 3
Plan Fractions Treated to Date: 4
Plan Prescribed Dose Per Fraction: 1.8 Gy
Plan Total Fractions Prescribed: 25
Plan Total Prescribed Dose: 45 Gy
Reference Point Dosage Given to Date: 7.2 Gy
Reference Point Session Dosage Given: 1.8 Gy
Session Number: 4

## 2023-01-22 LAB — ECHOCARDIOGRAM COMPLETE
AR max vel: 2.51 cm2
AV Area VTI: 2.47 cm2
AV Area mean vel: 2.41 cm2
AV Mean grad: 5.4 mmHg
AV Peak grad: 9.7 mmHg
Ao pk vel: 1.55 m/s
Area-P 1/2: 3.45 cm2
P 1/2 time: 381 msec
S' Lateral: 2.6 cm

## 2023-01-22 LAB — CMP (CANCER CENTER ONLY)
ALT: 6 U/L (ref 0–44)
AST: 28 U/L (ref 15–41)
Albumin: 3.3 g/dL — ABNORMAL LOW (ref 3.5–5.0)
Alkaline Phosphatase: 61 U/L (ref 38–126)
Anion gap: 7 (ref 5–15)
BUN: 29 mg/dL — ABNORMAL HIGH (ref 8–23)
CO2: 27 mmol/L (ref 22–32)
Calcium: 9.1 mg/dL (ref 8.9–10.3)
Chloride: 105 mmol/L (ref 98–111)
Creatinine: 1.6 mg/dL — ABNORMAL HIGH (ref 0.61–1.24)
GFR, Estimated: 40 mL/min — ABNORMAL LOW (ref >60)
Glucose, Bld: 120 mg/dL — ABNORMAL HIGH (ref 70–99)
Potassium: 4.6 mmol/L (ref 3.5–5.1)
Sodium: 139 mmol/L (ref 135–145)
Total Bilirubin: 1 mg/dL (ref 0.3–1.2)
Total Protein: 5.4 g/dL — ABNORMAL LOW (ref 6.5–8.1)

## 2023-01-22 LAB — PHOSPHORUS: Phosphorus: 3.2 mg/dL (ref 2.5–4.6)

## 2023-01-22 LAB — MAGNESIUM: Magnesium: 1.8 mg/dL (ref 1.7–2.4)

## 2023-01-22 NOTE — Progress Notes (Signed)
Nutrition follow-up completed with patient and his daughter.  Patient is status post 50 [employer] balloon feeding tube for esophageal cancer.  Radiation begins on February 8 and should finish by March 13.  He is receiving palliative radiation therapy and followed by Dr. Lisbeth Renshaw.  Weight documented as 128 pounds on February 6, decreased from 135 pounds 3.2 ounces on February 2.  This is a 7% weight loss in less than 1 month which is significant.  Revised estimated nutrition needs: 1700-1900 cal, 70-80 g protein, greater than 1.8 L fluid.  Labs noted: Sodium 139, potassium 4.6, glucose 120, BUN 29, creatinine 1.6, albumin 3.3, and magnesium 1.8 on February 8.  Phosphorus is pending.  Patient and daughter present to nutrition follow-up.  Patient reports he feels about the same.  Daughter says patient is only able to drink a little bit of broth and a few sips of water.  It appears he has not had any significant calories or protein for several days.  Patient is at risk for refeeding syndrome secondary to cancer diagnosis, poor intake, and weight loss.  Initial labs were drawn today.  Labs ordered twice next week.    Nutrition diagnosis: Inadequate oral intake continues.    Intervention: Educated on administering tube feeding with free water flushes.  Daughter successfully performed one half carton of [employer] 1.4 with 110 mL of free water before and after bolus feeding.  Patient tolerated this well.  Written instructions were given.  Patient also supplied with 2 cases of [employer] 1.4 (24 cartons) and tube feeding supplies.  Patient will begin with one half carton [facility] 1.4, 4 times daily for the next 3 days and increase on Monday by one half carton daily to goal rate of 4 cartons daily.  Stressed importance of patient sitting upright and administering room temperature formula.  Patient is allowed to sip on liquids however, discouraged him from pushing a lot of fluid by mouth prior to tube  feeding administration.  Half cartons of tube feeding should be kept in the refrigerator and taken out 20 to 30 minutes before next feeding to allow it to warm to room temperature.  Tube feeding goal: 4 cartons [employer] 1.4 with 110 mL free water before and after bolus feedings.  This provides 1820 cal, 80 g of protein, 1816 mL free water.  This should provide 100% estimated nutrition needs.  Refeeding labs were taken today and are ordered 2 times next week.  RN educated on cleaning tube feeding site.  Monitoring, evaluation, goals: Patient will tolerate slow tube feeding advancement to goal with no signs of refeeding syndrome.  This should promote weight gain.  Next visit: Monday, February 12 after radiation therapy.  **Disclaimer: This note was dictated with voice recognition software. Similar sounding words can inadvertently be transcribed and this note may contain transcription errors which may not have been corrected upon publication of note.**

## 2023-01-22 NOTE — Progress Notes (Signed)
error 

## 2023-01-23 ENCOUNTER — Ambulatory Visit
Admission: RE | Admit: 2023-01-23 | Discharge: 2023-01-23 | Disposition: A | Discharge status: Home / Self Care | Payer: Medicare Other | Source: Ambulatory Visit | Attending: Radiation Oncology | Admitting: Radiation Oncology

## 2023-01-23 ENCOUNTER — Other Ambulatory Visit: Payer: Self-pay

## 2023-01-23 DIAGNOSIS — Z510 Encounter for antineoplastic radiation therapy: Secondary | ICD-10-CM | POA: Diagnosis not present

## 2023-01-23 DIAGNOSIS — C155 Malignant neoplasm of lower third of esophagus: Secondary | ICD-10-CM

## 2023-01-23 LAB — RAD ONC ARIA SESSION SUMMARY
Course Elapsed Days: 4
Plan Fractions Treated to Date: 5
Plan Prescribed Dose Per Fraction: 1.8 Gy
Plan Total Fractions Prescribed: 25
Plan Total Prescribed Dose: 45 Gy
Reference Point Dosage Given to Date: 9 Gy
Reference Point Session Dosage Given: 1.8 Gy
Session Number: 5

## 2023-01-23 MED ORDER — SONAFINE EX EMUL
1.0000 | Freq: Two times a day (BID) | CUTANEOUS | Status: DC
  Administered 2023-01-23: 1 via TOPICAL

## 2023-01-23 NOTE — Progress Notes (Signed)
Pt here for patient teaching. Pt given Radiation and You booklet, Managing Acute Radiation Side Effects for Head and Neck Cancer handout, skin care instructions, and Sonafine.  Reviewed areas of pertinence such as diarrhea, fatigue, hair loss, mouth changes, nausea and vomiting, skin changes, throat changes, cough, shortness of breath, and taste changes. Pt able to give teach back of to pat skin, use unscented/gentle soap, and drink plenty of water, apply Sonafine bid, avoid applying anything to skin within 4 hours of treatment, and to use an electric razor if they must shave. Pt verbalizes understanding of information given and will contact nursing with any questions or concerns.     [email or web address]

## 2023-01-23 NOTE — Progress Notes (Signed)
I left a message for [name] letting her know I have scheduled her dad for an appt with Dr Burr Medico on 2/15 at 1340 and moved his lab appt to 1315.  I told her I will call back on Monday to confirm

## 2023-01-26 ENCOUNTER — Inpatient Hospital Stay: Payer: Medicare Other | Admitting: Nutrition

## 2023-01-26 ENCOUNTER — Other Ambulatory Visit: Payer: Self-pay | Admitting: Hematology

## 2023-01-26 ENCOUNTER — Inpatient Hospital Stay: Payer: Medicare Other

## 2023-01-26 ENCOUNTER — Ambulatory Visit
Admission: RE | Admit: 2023-01-26 | Discharge: 2023-01-26 | Disposition: A | Discharge status: Home / Self Care | Payer: Medicare Other | Source: Ambulatory Visit | Attending: Radiation Oncology | Admitting: Radiation Oncology

## 2023-01-26 ENCOUNTER — Other Ambulatory Visit: Payer: Self-pay

## 2023-01-26 DIAGNOSIS — Z510 Encounter for antineoplastic radiation therapy: Secondary | ICD-10-CM | POA: Diagnosis not present

## 2023-01-26 DIAGNOSIS — C159 Malignant neoplasm of esophagus, unspecified: Secondary | ICD-10-CM

## 2023-01-26 DIAGNOSIS — R1319 Other dysphagia: Secondary | ICD-10-CM

## 2023-01-26 LAB — CMP (CANCER CENTER ONLY)
ALT: 7 U/L (ref 0–44)
AST: 21 U/L (ref 15–41)
Albumin: 3.3 g/dL — ABNORMAL LOW (ref 3.5–5.0)
Alkaline Phosphatase: 72 U/L (ref 38–126)
Anion gap: 3 — ABNORMAL LOW (ref 5–15)
BUN: 31 mg/dL — ABNORMAL HIGH (ref 8–23)
CO2: 30 mmol/L (ref 22–32)
Calcium: 8.9 mg/dL (ref 8.9–10.3)
Chloride: 104 mmol/L (ref 98–111)
Creatinine: 1.5 mg/dL — ABNORMAL HIGH (ref 0.61–1.24)
GFR, Estimated: 43 mL/min — ABNORMAL LOW (ref >60)
Glucose, Bld: 114 mg/dL — ABNORMAL HIGH (ref 70–99)
Potassium: 4.8 mmol/L (ref 3.5–5.1)
Sodium: 137 mmol/L (ref 135–145)
Total Bilirubin: 0.5 mg/dL (ref 0.3–1.2)
Total Protein: 5.6 g/dL — ABNORMAL LOW (ref 6.5–8.1)

## 2023-01-26 LAB — CBC WITH DIFFERENTIAL (CANCER CENTER ONLY)
Abs Immature Granulocytes: 0.03 10*3/uL (ref 0.00–0.07)
Basophils Absolute: 0 10*3/uL (ref 0.0–0.1)
Basophils Relative: 1 %
Eosinophils Absolute: 0.2 10*3/uL (ref 0.0–0.5)
Eosinophils Relative: 5 %
HCT: 36.4 % — ABNORMAL LOW (ref 39.0–52.0)
Hemoglobin: 12.8 g/dL — ABNORMAL LOW (ref 13.0–17.0)
Immature Granulocytes: 1 %
Lymphocytes Relative: 19 %
Lymphs Abs: 0.9 10*3/uL (ref 0.7–4.0)
MCH: 35.6 pg — ABNORMAL HIGH (ref 26.0–34.0)
MCHC: 35.2 g/dL (ref 30.0–36.0)
MCV: 101.1 fL — ABNORMAL HIGH (ref 80.0–100.0)
Monocytes Absolute: 0.3 10*3/uL (ref 0.1–1.0)
Monocytes Relative: 5 %
Neutro Abs: 3.4 10*3/uL (ref 1.7–7.7)
Neutrophils Relative %: 69 %
Platelet Count: 224 10*3/uL (ref 150–400)
RBC: 3.6 MIL/uL — ABNORMAL LOW (ref 4.22–5.81)
RDW: 12.8 % (ref 11.5–15.5)
WBC Count: 4.8 10*3/uL (ref 4.0–10.5)
nRBC: 0 % (ref 0.0–0.2)

## 2023-01-26 LAB — RAD ONC ARIA SESSION SUMMARY
Course Elapsed Days: 7
Plan Fractions Treated to Date: 6
Plan Prescribed Dose Per Fraction: 1.8 Gy
Plan Total Fractions Prescribed: 25
Plan Total Prescribed Dose: 45 Gy
Reference Point Dosage Given to Date: 10.8 Gy
Reference Point Session Dosage Given: 1.8 Gy
Session Number: 6

## 2023-01-26 LAB — PHOSPHORUS: Phosphorus: 3.9 mg/dL (ref 2.5–4.6)

## 2023-01-26 LAB — MAGNESIUM: Magnesium: 2 mg/dL (ref 1.7–2.4)

## 2023-01-26 NOTE — Progress Notes (Signed)
Nutrition follow-up completed with patient and daughter.  Patient is status post PEG for esophageal cancer.  Radiation scheduled to complete by March 13.  Weight improved and was documented as 130.8 pounds today, increased from 128 pounds February 6.  Noted labs: Glucose 114, BUN 31, creatinine 1.5, sodium 137, potassium 4.8,Magnesium 2.0, and phosphorus 3.9.  No signs of refeeding syndrome.  Estimated nutrition needs: 1700-1900 cal, 70-80 g protein, greater than 1.8 L fluid.  Patient reports he feels a bit better.  Complains of early satiety and inability to increase tube feeding.  Reports he does not sleep well unless he finishes last feeding by 5 PM.  He does not want to start tube feeding earlier than 8:00 AM.  This only leaves time for 3 feedings daily.    Patient is not having regular bowel movements.  States he had a small dark smear/stool yesterday.  Patient reports it has been 1 month since he had a regular bowel movement.  Daughter states this problem existed prior to diagnosis of cancer.  He is able to take pills by mouth but states he cannot swallow much water.  He is not eating any food.    PEG site looks clean with a small amount of blood on gauze.  Per daughter patient has tolerated approximately 600 mL of [employer] 1.4 daily. (220 mL, 220 mL, and 160 mL) This is approximately 840 cal.  She has provided 60 mL of free water before and after feedings 3 times a day. Total free water equals 1032 mL.  Tube feeding goal is 4 cartons/approximately 1300 mL of [employer] 1.4 daily.  Free water flush is 110 mL before and after feedings 4 times daily.  This will provide 1820 cal, 80 g protein, 1860 mL free water.  Nutrition diagnosis: Inadequate oral intake continues.  Intervention: Educated to increase [employer] 1.4 x 1/2 carton daily to goal rate of 4 cartons daily.  Encouraged 110 mL free water flushes before and after feedings 4 times a day.  Recommended changing time of feedings to  7:30 AM, 10:30 AM, 1:30 PM, 4:30 PM.  Assess tolerance. Consider Reglan to help with early satiety. Recommend bowel regimen.  RN suggested MiraLAX 1-2 times daily. Consider IV fluids to help meet free water needs as tube feeding is advancing. Refeeding labs are ordered for Thursday this week.  Monitoring, evaluation, goals: Patient will tolerate tube feeding advancement to meet greater than 90% estimated nutrition needs.  Next visit: Friday, February 16 with Vinnie Level.  **Disclaimer: This note was dictated with voice recognition software. Similar sounding words can inadvertently be transcribed and this note may contain transcription errors which may not have been corrected upon publication of note.**

## 2023-01-27 ENCOUNTER — Other Ambulatory Visit: Payer: Self-pay

## 2023-01-27 ENCOUNTER — Ambulatory Visit
Admission: RE | Admit: 2023-01-27 | Discharge: 2023-01-27 | Disposition: A | Discharge status: Home / Self Care | Payer: Medicare Other | Source: Ambulatory Visit | Attending: Radiation Oncology | Admitting: Radiation Oncology

## 2023-01-27 DIAGNOSIS — Z510 Encounter for antineoplastic radiation therapy: Secondary | ICD-10-CM | POA: Diagnosis not present

## 2023-01-27 LAB — RAD ONC ARIA SESSION SUMMARY
Course Elapsed Days: 8
Plan Fractions Treated to Date: 7
Plan Prescribed Dose Per Fraction: 1.8 Gy
Plan Total Fractions Prescribed: 25
Plan Total Prescribed Dose: 45 Gy
Reference Point Dosage Given to Date: 12.6 Gy
Reference Point Session Dosage Given: 1.8 Gy
Session Number: 7

## 2023-01-28 ENCOUNTER — Ambulatory Visit
Admission: RE | Admit: 2023-01-28 | Discharge: 2023-01-28 | Disposition: A | Discharge status: Home / Self Care | Payer: Medicare Other | Source: Ambulatory Visit | Attending: Radiation Oncology | Admitting: Radiation Oncology

## 2023-01-28 ENCOUNTER — Other Ambulatory Visit: Payer: Self-pay

## 2023-01-28 DIAGNOSIS — Z510 Encounter for antineoplastic radiation therapy: Secondary | ICD-10-CM | POA: Diagnosis not present

## 2023-01-28 LAB — RAD ONC ARIA SESSION SUMMARY
Course Elapsed Days: 9
Plan Fractions Treated to Date: 8
Plan Prescribed Dose Per Fraction: 1.8 Gy
Plan Total Fractions Prescribed: 25
Plan Total Prescribed Dose: 45 Gy
Reference Point Dosage Given to Date: 14.4 Gy
Reference Point Session Dosage Given: 1.8 Gy
Session Number: 8

## 2023-01-29 ENCOUNTER — Inpatient Hospital Stay (HOSPITAL_BASED_OUTPATIENT_CLINIC_OR_DEPARTMENT_OTHER): Payer: Medicare Other | Admitting: Hematology

## 2023-01-29 ENCOUNTER — Encounter: Payer: Self-pay | Admitting: Hematology

## 2023-01-29 ENCOUNTER — Ambulatory Visit
Admission: RE | Admit: 2023-01-29 | Discharge: 2023-01-29 | Disposition: A | Discharge status: Home / Self Care | Payer: Medicare Other | Source: Ambulatory Visit | Attending: Radiation Oncology | Admitting: Radiation Oncology

## 2023-01-29 ENCOUNTER — Other Ambulatory Visit: Payer: Self-pay

## 2023-01-29 ENCOUNTER — Inpatient Hospital Stay: Payer: Medicare Other

## 2023-01-29 VITALS — BP 124/52 | HR 67 | Temp 98.2°F | Resp 16

## 2023-01-29 VITALS — BP 118/57 | HR 76 | Temp 97.8°F | Resp 15 | Ht 65.5 in | Wt 131.7 lb

## 2023-01-29 DIAGNOSIS — Z510 Encounter for antineoplastic radiation therapy: Secondary | ICD-10-CM | POA: Diagnosis not present

## 2023-01-29 DIAGNOSIS — D539 Nutritional anemia, unspecified: Secondary | ICD-10-CM

## 2023-01-29 DIAGNOSIS — C159 Malignant neoplasm of esophagus, unspecified: Secondary | ICD-10-CM

## 2023-01-29 DIAGNOSIS — R1319 Other dysphagia: Secondary | ICD-10-CM

## 2023-01-29 DIAGNOSIS — Z23 Encounter for immunization: Secondary | ICD-10-CM

## 2023-01-29 DIAGNOSIS — C155 Malignant neoplasm of lower third of esophagus: Secondary | ICD-10-CM | POA: Diagnosis not present

## 2023-01-29 LAB — CMP (CANCER CENTER ONLY)
ALT: 6 U/L (ref 0–44)
AST: 18 U/L (ref 15–41)
Albumin: 3.1 g/dL — ABNORMAL LOW (ref 3.5–5.0)
Alkaline Phosphatase: 69 U/L (ref 38–126)
Anion gap: 6 (ref 5–15)
BUN: 33 mg/dL — ABNORMAL HIGH (ref 8–23)
CO2: 27 mmol/L (ref 22–32)
Calcium: 8.5 mg/dL — ABNORMAL LOW (ref 8.9–10.3)
Chloride: 104 mmol/L (ref 98–111)
Creatinine: 1.42 mg/dL — ABNORMAL HIGH (ref 0.61–1.24)
GFR, Estimated: 46 mL/min — ABNORMAL LOW (ref >60)
Glucose, Bld: 139 mg/dL — ABNORMAL HIGH (ref 70–99)
Potassium: 4.8 mmol/L (ref 3.5–5.1)
Sodium: 137 mmol/L (ref 135–145)
Total Bilirubin: 0.4 mg/dL (ref 0.3–1.2)
Total Protein: 5.2 g/dL — ABNORMAL LOW (ref 6.5–8.1)

## 2023-01-29 LAB — RAD ONC ARIA SESSION SUMMARY
Course Elapsed Days: 10
Plan Fractions Treated to Date: 9
Plan Prescribed Dose Per Fraction: 1.8 Gy
Plan Total Fractions Prescribed: 25
Plan Total Prescribed Dose: 45 Gy
Reference Point Dosage Given to Date: 16.2 Gy
Reference Point Session Dosage Given: 1.8 Gy
Session Number: 9

## 2023-01-29 LAB — CBC WITH DIFFERENTIAL (CANCER CENTER ONLY)
Abs Immature Granulocytes: 0.02 10*3/uL (ref 0.00–0.07)
Basophils Absolute: 0 10*3/uL (ref 0.0–0.1)
Basophils Relative: 1 %
Eosinophils Absolute: 0.2 10*3/uL (ref 0.0–0.5)
Eosinophils Relative: 5 %
HCT: 34 % — ABNORMAL LOW (ref 39.0–52.0)
Hemoglobin: 12 g/dL — ABNORMAL LOW (ref 13.0–17.0)
Immature Granulocytes: 1 %
Lymphocytes Relative: 16 %
Lymphs Abs: 0.7 10*3/uL (ref 0.7–4.0)
MCH: 35.6 pg — ABNORMAL HIGH (ref 26.0–34.0)
MCHC: 35.3 g/dL (ref 30.0–36.0)
MCV: 100.9 fL — ABNORMAL HIGH (ref 80.0–100.0)
Monocytes Absolute: 0.4 10*3/uL (ref 0.1–1.0)
Monocytes Relative: 8 %
Neutro Abs: 3.1 10*3/uL (ref 1.7–7.7)
Neutrophils Relative %: 69 %
Platelet Count: 218 10*3/uL (ref 150–400)
RBC: 3.37 MIL/uL — ABNORMAL LOW (ref 4.22–5.81)
RDW: 12.7 % (ref 11.5–15.5)
WBC Count: 4.4 10*3/uL (ref 4.0–10.5)
nRBC: 0 % (ref 0.0–0.2)

## 2023-01-29 LAB — PHOSPHORUS: Phosphorus: 3.8 mg/dL (ref 2.5–4.6)

## 2023-01-29 LAB — MAGNESIUM: Magnesium: 2.1 mg/dL (ref 1.7–2.4)

## 2023-01-29 MED ORDER — INFLUENZA VAC A&B SA ADJ QUAD 0.5 ML IM PRSY
0.5000 mL | PREFILLED_SYRINGE | Freq: Once | INTRAMUSCULAR | Status: AC
  Administered 2023-01-29: 0.5 mL via INTRAMUSCULAR
  Filled 2023-01-29: qty 0.5

## 2023-01-29 MED ORDER — SODIUM CHLORIDE 0.9 % IV SOLN: Freq: Once | INTRAVENOUS | Status: AC

## 2023-01-29 NOTE — Patient Instructions (Addendum)
Rehydration, Older Adult  Rehydration is the replacement of fluids, salts, and minerals in the body (electrolytes) that are lost during dehydration. Dehydration is when there is not enough water or other fluids in the body. This happens when you lose more fluids than you take in. People who are age 87 or older have a higher risk of dehydration than younger adults. This is because in older age, the body: Is less able to maintain the right amount of water. Does not respond to temperature changes as well. Does not get a sense of thirst as easily or quickly. Other causes include: Not drinking enough fluids. This can occur when you are ill, when you forget to drink, or when you are doing activities that require a lot of energy, especially in hot weather. Conditions that cause loss of water or other fluids. These include diarrhea, vomiting, sweating, or urinating a lot. Other illnesses, such as fever or infection. Certain medicines, such as those that remove excess fluid from the body (diuretics). Symptoms of mild or moderate dehydration may include thirst, dry lips and mouth, and dizziness. Symptoms of severe dehydration may include increased heart rate, confusion, fainting, and not urinating. In severe cases, you may need to get fluids through an IV at the hospital. For mild or moderate cases, you can usually rehydrate at home by drinking certain fluids as told by your health care provider. What are the risks? Rehydration is usually safe. Taking in too much fluid (overhydration) can be a problem but is rare. Overhydration can cause an imbalance of electrolytes in the body, kidney failure, fluid in the lungs, or a decrease in salt (sodium) levels in the body. Supplies needed: You will need an oral rehydration solution (ORS) if your health care provider tells you to use one. This is a drink to treat dehydration. It can be found in pharmacies and retail stores. How to rehydrate Fluids Follow  instructions from your health care provider about what to drink. The kind of fluid and the amount you should drink depend on your condition. In general, you should choose drinks that you prefer. If told by your health care provider, drink an ORS. Make an ORS by following instructions on the package. Start by drinking small amounts, about  cup (120 mL) every 5-10 minutes. Slowly increase how much you drink until you have taken in the amount recommended by your health care provider. Drink enough clear fluids to keep your urine pale yellow. If you were told to drink an ORS, finish it first, then start slowly drinking other clear fluids. Drink fluids such as: Water. This includes sparkling and flavored water. Drinking only water can lead to having too little sodium in your body (hyponatremia). Follow the advice of your health care provider. Water from ice chips you suck on. Fruit juice with water added to it(diluted). Sports drinks. Hot or cold herbal teas. Broth-based soups. Coffee. Milk or milk products. Food Follow instructions from your health care provider about what to eat while you rehydrate. Your health care provider may recommend that you slowly begin eating regular foods in small amounts. Eat foods that contain a healthy balance of electrolytes, such as bananas, oranges, potatoes, tomatoes, and spinach. Avoid foods that are greasy or contain a lot of sugar. In some cases, you may get nutrition through a feeding tube that is passed through your nose and into your stomach (nasogastric tube, or NG tube). This may be done if you have uncontrolled vomiting or diarrhea. Drinks to avoid  Certain drinks may make dehydration worse. While you rehydrate, avoid drinking alcohol. How to tell if you are recovering from dehydration You may be getting better if: You are urinating more often than before you started rehydrating. Your urine is pale yellow. Your energy level improves. You vomit less  often. You have diarrhea less often. Your appetite improves or returns to normal. You feel less dizzy or light-headed. Your skin tone and color start to look more normal. Follow these instructions at home: Take over-the-counter and prescription medicines only as told by your health care provider. Do not take sodium tablets. Doing this can lead to having too much sodium in your body (hypernatremia). Contact a health care provider if: You continue to have symptoms of mild or moderate dehydration, such as: Thirst. Dry lips. Slightly dry mouth. Dizziness. Dark urine or less urine than usual. Muscle cramps. You continue to vomit or have diarrhea. Get help right away if: You have symptoms of dehydration that get worse. You have a fever. You have a severe headache. You have been vomiting and have problems, such as: Your vomiting gets worse. Your vomit includes blood or green matter (bile). You cannot eat or drink without vomiting. You have problems with urination or bowel movements, such as: Diarrhea that gets worse. Blood in your stool (feces). This may cause stool to look black and tarry. Not urinating, or urinating only a small amount of very dark urine, within 6-8 hours. You have trouble breathing. You have symptoms that get worse with treatment. These symptoms may be an emergency. Get help right away. Call 911. Do not wait to see if the symptoms will go away. Do not drive yourself to the hospital. This information is not intended to replace advice given to you by your health care provider. Make sure you discuss any questions you have with your health care provider.  Preventing Influenza, Adult Influenza, also known as the flu, is an infection caused by a virus. The flu mainly affects the nose, throat, and lungs (respiratory system). This infection causes common cold symptoms, as well as a high fever and body aches. The flu spreads easily from person to person (is contagious). The  flu is most common from December through March. This period of time is called flu season. You can catch the flu virus by: Breathing in droplets from an infected person's cough or sneeze. Touching something that recently got the virus on it (is contaminated) and then touching your mouth, nose, or eyes. How can this condition affect me? If you get the flu, your friends, family, and co-workers are also at risk of getting it because the flu spreads easily to others. Having the flu can lead to complications, such as pneumonia, ear infection, and sinus infection. The flu also can be life-threatening, especially for babies, people older than age 52, and people who have serious long-term diseases. You can protect yourself and other people by: Keeping your body's defense system (immune system) strong. Getting a flu shot, or influenza vaccination, each year. Practicing good health habits. What can increase my risk? The following factors may make you more likely to get the flu: Not washing or sanitizing your hands often. Having close contact with many people during cold and flu season. Touching your mouth, eyes, or nose without first washing or sanitizing your hands. Not getting a flu shot each year. Working in health care. You may be at risk for more serious flu symptoms if: You are older than age 16. You are  pregnant. Your immune system is weak. You have a condition that makes the flu worse or life-threatening. You have severe obesity. What actions can I take to prevent this? Lifestyle You can lower your risk of getting the flu by keeping your immune system in good shape. Do this by: Eating a healthy diet. Drinking plenty of fluids. Drink enough fluid to keep your urine pale yellow. Getting enough sleep. Exercising regularly. Do not use any products that contain nicotine or tobacco. These products include cigarettes, chewing tobacco, and vaping devices, such as e-cigarettes. If you need help  quitting, ask your health care provider. Medicines Getting a flu shot every year can lower your risk of getting the flu. This is the best way to prevent the flu. A flu shot is recommended for everyone age 63 months or older. It is best to get a flu shot in the fall, as soon as it is available. But getting a flu shot during winter or spring instead is still a good idea. Flu season can last into early spring. Preventing the flu through vaccination means getting a new flu shot every year. This is because the flu virus changes slightly (mutates) from one year to the next. The flu shot does not completely protect you from all flu virus mutations. If you get the flu after you get the shot, the shot can make your illness milder and go away sooner. It also can prevent dangerous complications of the flu. If you are pregnant, you can and should get a flu shot. Check with your health care provider before getting a flu shot if you have had a reaction to the shot in the past or if you are allergic to eggs. Sometimes the vaccine is available as a nasal spray. In some years, the nasal spray has not been as effective against the flu virus. Check with your health care provider if you have questions about this. Most people recover from the flu by resting at home and drinking plenty of fluids. However, a prescription antiviral medicine may reduce your flu symptoms and may make your flu go away sooner. This medicine must be started within a few days of getting flu symptoms. Antiviral medicine may be prescribed for people who are at risk for more serious flu symptoms. Talk with your health care provider about whether you need an antiviral medicine.  General information     You can also lower your risk of getting the flu by practicing good health habits. This is especially important during flu season. Avoid contact with people who are sick with flu or cold symptoms. Wash your hands with soap and water often and for at  least 20 seconds. If soap and water are not available, use alcohol-based hand sanitizer. Avoid touching your hands to your face, especially when you have not washed your hands recently. Use a cleanser that kills germs (a disinfectant) to clean surfaces at home and at work that may have the flu virus on them. If you do get the flu, avoid spreading it to others by: Staying home until your symptoms, including your fever, have been gone for at least 24 hours. Covering your mouth and nose when you cough or sneeze. Avoiding close contact with others, especially babies and older people.  Where to find more information Centers for Disease Control and Prevention: http://www.wolf.info/ American Academy of Family Physicians: www.familydoctor.org Contact a health care provider if: You have the flu and you develop new symptoms. You have diarrhea. You have a fever.  Your cough gets worse, or you produce more mucus. Get help right away if you: Have trouble breathing. Have chest pain. These symptoms may represent a serious problem that is an emergency. Do not wait to see if the symptoms will go away. Get medical help right away. Call your local emergency services (911 in the [place] Do not drive yourself to the hospital. Summary The best way to prevent the flu is to get a flu shot (influenza vaccination) every year in the fall. Even if you get the flu after you have received the yearly flu shot, your flu may be milder and go away sooner because of your flu shot. If you get the flu, antiviral medicines that are started within a few days of flu symptoms may reduce your symptoms and reduce how long the flu lasts. You can also help prevent the flu by practicing good health habits. This information is not intended to replace advice given to you by your health care provider. Make sure you discuss any questions you have with your health care provider. Document Revised: 07/20/2020 Document Reviewed: 07/20/2020 Elsevier  Patient Education  [place] Revised: 04/16/2022 Document Reviewed: 04/14/2022 Elsevier Patient Education  [place].

## 2023-01-29 NOTE — Assessment & Plan Note (Signed)
-  diagnosed in 12/2022 -

## 2023-01-29 NOTE — Progress Notes (Signed)
Daniel [name]   Telephone:([phone]) [phone] [phone]) [phone]   Clinic Follow up Note   Patient Care Team: Nelly Laurence, NP as PCP - General (Family Medicine) Berniece Salines, DO as PCP - Cardiology (Cardiology) Truitt Merle, MD as Attending Physician (Hematology and Oncology)  Date of Service:  01/29/2023  CHIEF COMPLAINT: f/u of Esophageal Cancer   CURRENT THERAPY:  Radiation Therapy  ASSESSMENT:  Daniel [name] is a 89 y.o. male with   Esophageal cancer ([place]), stage IV -diagnosed in 12/2022 -I reviewed his PET scan from January 04, 2023, which showed hypermetabolic distal esophageal uptake, and hypermetabolic mediastinal and bilateral hilar adenopathy, right pubic bone and anterior left sixth ribs, concerning for metastatic disease.  I reviewed the images with patient and his daughter in detail today. -He has started palliative radiation 2 weeks ago, he is tolerating radiation well so far, but noticed slightly worse dysphagia.  We discussed the potential side effect of radiation, including odynophagia -Patient is not a candidate for concurrent chemo with radiation, due to his advanced age -Continue nutrition supplement through feeding tube, if tolerating well. 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 Beta-2 micro: 5.9 Normal immunoglogulin heavy chains Hgb 11.4 Creatinine 2.08 No hypercalcemia   09/09/2018 Imaging   Metastatic skeletal survey negative   09/13/2018 Bone Marrow Biopsy   Morphology (Accession: [id number]): Bone Marrow, Aspirate,Biopsy, and Clot - HYPERCELLULAR BONE MARROW WITH PLASMA CELL NEOPLASM (12% cells, IHC showed lambda light chain restriction) - TRILINEAGE HEMATOPOIESIS. - SEE COMMENT.  Flow cytometry (Accession: [id number]):  - NO MONOCLONAL B-CELL POPULATION IDENTIFIED. - PREDOMINANCE OF T LYMPHOCYTES AND NATURAL KILLER CELLS.   01/05/2023 Pathology Results    FINAL MICROSCOPIC DIAGNOSIS:   A. STOMACH, BIOPSY:  - Gastric mucosa with focally active chronic gastritis, see comment  - Multifocal intestinal metaplasia, negative for dysplasia   B. GE JUNCTION MASS, BIOPSY:  - Invasive moderately differentiated adenocarcinoma    01/07/2023 Imaging    IMPRESSION: 1. Distal esophageal stricture extending over about a 3.9 cm vertical excursion. No findings of metastatic disease to the chest, abdomen, or pelvis. 2. 0.5 cm sub solid nodule in the right middle lobe. Fleischner guidelines do not apply due to history of malignancy. Surveillance of the context of the patient's follow up oncology imaging is suggested. 3. Nonspecific lobulation of the left mid kidney. This could be incidental or due to CT equivocal end of a dromedary hump but I cannot exclude an underlying left mid kidney mass. This could be surveilled in the context of the patient's follow up oncology imaging, or if clinically warranted, renal MRI with and without contrast could be utilized for  further characterization. 4. L1 compression fracture with 90% anterior compression and 6 mm of posterior bony retropulsion,  likely late subacute chronicity. 5. Wall thickening in the proximal colon extending to the transverse colon, cannot exclude proximal colitis. 6. Other imaging findings of potential clinical significance: Coronary, aortic arch, and branch vessel atherosclerotic disease. Mitral and aortic valve calcification. Small type 1 hiatal hernia along the lower margin of the mass. Central airway thickening is present, suggesting bronchitis or reactive airways disease. Fatty deposition in the wall of the ascending colon may be incidental but which can have a weak association with inflammatory bowel disease. Lumbar spondylosis and degenerative disc disease contributing to multilevel impingement. 6 mm left kidney lower pole nonobstructive renal calculus.   Esophageal cancer ([place])  01/05/2023 Pathology Results    FINAL MICROSCOPIC DIAGNOSIS:   A. STOMACH, BIOPSY:  - Gastric mucosa with focally active chronic gastritis, see comment  - Multifocal intestinal metaplasia, negative for dysplasia   B. GE JUNCTION MASS, BIOPSY:  - Invasive moderately differentiated adenocarcinoma    01/07/2023 Imaging    IMPRESSION: 1. Distal esophageal stricture extending over about a 3.9 cm vertical excursion. No findings of metastatic disease to the chest, abdomen, or pelvis. 2. 0.5 cm sub solid nodule in the right middle lobe. Fleischner guidelines do not apply due to history of malignancy. Surveillance of the context of the patient's follow up oncology imaging is suggested. 3. Nonspecific lobulation of the left mid kidney. This could be incidental or due to CT equivocal end of a dromedary hump but I cannot exclude an underlying left mid kidney mass. This could be surveilled in the context of the patient's follow up oncology imaging, or if clinically warranted, renal MRI with and without contrast could be utilized for further characterization. 4. L1 compression fracture with 90% anterior compression and 6 mm  of posterior bony retropulsion, likely late subacute chronicity. 5. Wall thickening in the proximal colon extending to the transverse colon, cannot exclude proximal colitis. 6. Other imaging findings of potential clinical significance: Coronary, aortic arch, and branch vessel atherosclerotic disease. Mitral and aortic valve calcification. Small type 1 hiatal hernia along the lower margin of the mass. Central airway thickening is present, suggesting bronchitis or reactive airways disease. Fatty deposition in the wall of the ascending colon may be incidental but which can have a weak association with inflammatory bowel disease. Lumbar spondylosis and degenerative disc disease contributing to multilevel impingement. 6 mm left kidney lower pole nonobstructive renal calculus.     01/09/2023 Initial Diagnosis   Esophageal cancer ([place])      INTERVAL HISTORY:  Daniel [name] is here for a follow up of Esophageal Cancer  He was last seen by me on 01/09/2023 He presents to the clinic accompanied by daughter. Pt state he is having a hard time getting sips of water to get down. Pt daughter states that he is starting to gain weight  so the Peg tube was great. Pt daughter states the pt is tolerating radiation well and denies having any pain. Pt daughter stated the pt is constipated.    All other systems were reviewed with the patient and are negative.  MEDICAL HISTORY:  Past Medical History:  Diagnosis Date   Blood transfusion    CKD (chronic kidney disease) stage 4, GFR 15-29 ml/min (HCC)    DVT (deep venous thrombosis) (HCC)    from surgery, no longer on Eliquis   Gout    Hearing loss    Hypertension  Hypertriglyceridemia    Hypothyroid    Prediabetes    Prostate cancer [facility])    Pulmonary nodule    Tinnitus     SURGICAL HISTORY: Past Surgical History:  Procedure Laterality Date   BIOPSY  01/05/2023   Procedure: BIOPSY;  Surgeon: Rush Landmark Telford Nab., MD;  Location: [facility]  ENDOSCOPY;  Service: Gastroenterology;;   CATARACT EXTRACTION Bilateral 2020   ESOPHAGOGASTRODUODENOSCOPY (EGD) WITH PROPOFOL N/A 01/05/2023   Procedure: ESOPHAGOGASTRODUODENOSCOPY (EGD) WITH PROPOFOL;  Surgeon: Irving Copas., MD;  Location: [facility];  Service: Gastroenterology;  Laterality: N/A;   FOREIGN BODY REMOVAL  01/05/2023   Procedure: FOREIGN BODY REMOVAL;  Surgeon: Rush Landmark Telford Nab., MD;  Location: [facility] ENDOSCOPY;  Service: Gastroenterology;;   HERNIA REPAIR Right 2010   IR GASTROSTOMY TUBE MOD SED  01/20/2023   IR NASO G TUBE PLC W/FL W/RAD  01/20/2023   left inguinal hernia repair  06/29/2008   with mesh sail   PROSTATECTOMY  1998   prostate cancer   right ankle  1980   fracture   right knee  1980   torn meniscus   TOTAL KNEE ARTHROPLASTY Right 01/20/2022   Procedure: TOTAL KNEE ARTHROPLASTY;  Surgeon: Vickey Huger, MD;  Location: [facility] ORS;  Service: Orthopedics;  Laterality: Right;    I have reviewed the social history and family history with the patient and they are unchanged from previous note.  ALLERGIES:  has No Known Allergies.  MEDICATIONS:  Current Outpatient Medications  Medication Sig Dispense Refill   allopurinol (ZYLOPRIM) 100 MG tablet Take 100 mg by mouth every Monday, Wednesday, and Friday.     ALPRAZolam (XANAX) 0.5 MG tablet Take 0.5 mg by mouth 3 (three) times daily as needed for sleep (tremors).     amLODipine (NORVASC) 5 MG tablet Take 5 mg by mouth daily.     apixaban (ELIQUIS) 2.5 MG TABS tablet Take 1 tablet (2.5 mg total) by mouth 2 (two) times daily. 180 tablet 3   cholecalciferol (VITAMIN D) 1000 units tablet Take 1,000 Units by mouth daily.     levothyroxine (SYNTHROID, LEVOTHROID) 100 MCG tablet Take 100 mcg by mouth daily before breakfast.     metoprolol succinate (TOPROL XL) 25 MG 24 hr tablet Take 0.5 tablets (12.5 mg total) by mouth daily. 45 tablet 3   omeprazole (PRILOSEC) 40 MG capsule Open 1 capsule and sprinkle in apple  sauce twice daily. 60 capsule 2   oxyCODONE (ROXICODONE) 5 MG/5ML solution Place 5 mLs (5 mg total) into feeding tube every 8 (eight) hours. 100 mL 0   sucralfate (CARAFATE) 1 GM/10ML suspension Take 10 mLs (1 g total) by mouth 2 (two) times daily. 420 mL 1   No current facility-administered medications for this visit.    PHYSICAL EXAMINATION: ECOG PERFORMANCE STATUS: 2 - Symptomatic, <50% confined to bed  Vitals:   01/29/23 1358  BP: (!) 118/57  Pulse: 76  Resp: 15  Temp: 97.8 F (36.6 C)  SpO2: 100%   Wt Readings from Last 3 Encounters:  01/29/23 131 lb 11.2 oz (59.7 kg)  01/26/23 130 lb 12.8 oz (59.3 kg)  01/20/23 128 lb (58.1 kg)     GENERAL:alert, no distress and comfortable SKIN: skin color normal, no rashes or significant lesions EYES: normal, Conjunctiva are pink and non-injected, sclera clear  NEURO: alert & oriented x 3 with fluent speech   LABORATORY DATA:  I have reviewed the data as listed    Latest Ref Rng & Units 01/29/2023  1:18 PM 01/26/2023    2:12 PM 01/20/2023    8:33 AM  CBC  WBC 4.0 - 10.5 K/uL 4.4  4.8  6.9   Hemoglobin 13.0 - 17.0 g/dL 12.0  12.8  13.3   Hematocrit 39.0 - 52.0 % 34.0  36.4  39.3   Platelets 150 - 400 K/uL 218  224  232         Latest Ref Rng & Units 01/29/2023    1:18 PM 01/26/2023    2:12 PM 01/22/2023   12:32 PM  CMP  Glucose 70 - 99 mg/dL 139  114  120   BUN 8 - 23 mg/dL 33  31  29   Creatinine 0.61 - 1.24 mg/dL 1.42  1.50  1.60   Sodium 135 - 145 mmol/L 137  137  139   Potassium 3.5 - 5.1 mmol/L 4.8  4.8  4.6   Chloride 98 - 111 mmol/L 104  104  105   CO2 22 - 32 mmol/L 27  30  27   $ Calcium 8.9 - 10.3 mg/dL 8.5  8.9  9.1   Total Protein 6.5 - 8.1 g/dL 5.2  5.6  5.4   Total Bilirubin 0.3 - 1.2 mg/dL 0.4  0.5  1.0   Alkaline Phos 38 - 126 U/L 69  72  61   AST 15 - 41 U/L 18  21  28   $ ALT 0 - 44 U/L 6  7  6       $ RADIOGRAPHIC STUDIES: I have personally reviewed the radiological images as listed and agreed with  the findings in the report. No results found.    No orders of the defined types were placed in this encounter.  All questions were answered. The patient knows to call the clinic with any problems, questions or concerns. No barriers to learning was detected. The total time spent in the appointment was 30 minutes.     Truitt Merle, MD 01/29/2023   Felicity Coyer, CMA, am acting as scribe for Truitt Merle, MD.   I have reviewed the above documentation for accuracy and completeness, and I agree with the above.

## 2023-01-30 ENCOUNTER — Ambulatory Visit
Admission: RE | Admit: 2023-01-30 | Discharge: 2023-01-30 | Disposition: A | Discharge status: Home / Self Care | Payer: Medicare Other | Source: Ambulatory Visit | Attending: Radiation Oncology | Admitting: Radiation Oncology

## 2023-01-30 ENCOUNTER — Inpatient Hospital Stay: Payer: Medicare Other | Admitting: Dietician

## 2023-01-30 ENCOUNTER — Other Ambulatory Visit: Payer: Self-pay

## 2023-01-30 DIAGNOSIS — Z510 Encounter for antineoplastic radiation therapy: Secondary | ICD-10-CM | POA: Diagnosis not present

## 2023-01-30 LAB — RAD ONC ARIA SESSION SUMMARY
Course Elapsed Days: 11
Plan Fractions Treated to Date: 10
Plan Prescribed Dose Per Fraction: 1.8 Gy
Plan Total Fractions Prescribed: 25
Plan Total Prescribed Dose: 45 Gy
Reference Point Dosage Given to Date: 18 Gy
Reference Point Session Dosage Given: 1.8 Gy
Session Number: 10

## 2023-01-30 NOTE — Progress Notes (Addendum)
Nutrition Follow-up:  Patient with esophageal cancer. He is receiving radiation therapy. S/p PEG  Met with patient and daughter in office. Patient reports tolerating tube feedings well. Daughter reports patient has been receiving one carton [employer] 1.4 TID (1350 kcal, 60 g protein, 702 ml free water, 975 ml/day) She reports giving 4 ounces of water per feeding. She plans to increase this 5 ounces tomorrow. Patient is receiving weekly IV fluids to meet hydration needs. Patient reports having a bowel movement yesterday. This was his first BM in some time.   Medications: reviewed   Labs: BUN 33, Cr 1.42, glucose 139 - refeeding labs WNL  Anthropometrics: Last wt 131 lb 11.2 oz on 2/15   2/12 - 130 lb 12.8 oz 2/6 - 128 lb    NUTRITION DIAGNOSIS: Inadequate oral intake continues - pt relying on TF   INTERVENTION:  Encouraged 6-8 ounce water flush at bedtime - pt prefers not to have anything on his stomach before bed, but agreeable to try this 45-60 minutes prior to laying down Continue strategies to increase tube feedings towards goal of 4 cartons [employer] 1.4 Encouraged pt to try prostat samples provided (100 kcal, 15 grams of protein) once daily Continue supportive care with IV fluids     MONITORING, EVALUATION, GOAL: weight trends, TF   NEXT VISIT: Monday February 26 after radiation with Pamala Hurry

## 2023-02-02 ENCOUNTER — Other Ambulatory Visit: Payer: Self-pay

## 2023-02-02 ENCOUNTER — Ambulatory Visit
Admission: RE | Admit: 2023-02-02 | Discharge: 2023-02-02 | Disposition: A | Discharge status: Home / Self Care | Payer: Medicare Other | Source: Ambulatory Visit | Attending: Radiation Oncology | Admitting: Radiation Oncology

## 2023-02-02 ENCOUNTER — Other Ambulatory Visit: Payer: Self-pay | Admitting: Physician Assistant

## 2023-02-02 DIAGNOSIS — C159 Malignant neoplasm of esophagus, unspecified: Secondary | ICD-10-CM

## 2023-02-02 DIAGNOSIS — Z510 Encounter for antineoplastic radiation therapy: Secondary | ICD-10-CM | POA: Diagnosis not present

## 2023-02-02 LAB — RAD ONC ARIA SESSION SUMMARY
Course Elapsed Days: 14
Plan Fractions Treated to Date: 11
Plan Prescribed Dose Per Fraction: 1.8 Gy
Plan Total Fractions Prescribed: 25
Plan Total Prescribed Dose: 45 Gy
Reference Point Dosage Given to Date: 19.8 Gy
Reference Point Session Dosage Given: 1.8 Gy
Session Number: 11

## 2023-02-03 ENCOUNTER — Other Ambulatory Visit: Payer: Self-pay

## 2023-02-03 ENCOUNTER — Inpatient Hospital Stay (HOSPITAL_BASED_OUTPATIENT_CLINIC_OR_DEPARTMENT_OTHER): Payer: Medicare Other | Admitting: Physician Assistant

## 2023-02-03 ENCOUNTER — Ambulatory Visit
Admission: RE | Admit: 2023-02-03 | Discharge: 2023-02-03 | Disposition: A | Discharge status: Home / Self Care | Payer: Medicare Other | Source: Ambulatory Visit | Attending: Radiation Oncology | Admitting: Radiation Oncology

## 2023-02-03 VITALS — BP 111/66 | HR 78 | Temp 97.8°F | Resp 18 | Ht 65.5 in | Wt 131.6 lb

## 2023-02-03 DIAGNOSIS — C155 Malignant neoplasm of lower third of esophagus: Secondary | ICD-10-CM | POA: Diagnosis not present

## 2023-02-03 DIAGNOSIS — Z510 Encounter for antineoplastic radiation therapy: Secondary | ICD-10-CM | POA: Diagnosis not present

## 2023-02-03 LAB — RAD ONC ARIA SESSION SUMMARY
Course Elapsed Days: 15
Plan Fractions Treated to Date: 12
Plan Prescribed Dose Per Fraction: 1.8 Gy
Plan Total Fractions Prescribed: 25
Plan Total Prescribed Dose: 45 Gy
Reference Point Dosage Given to Date: 21.6 Gy
Reference Point Session Dosage Given: 1.8 Gy
Session Number: 12

## 2023-02-04 ENCOUNTER — Ambulatory Visit
Admission: RE | Admit: 2023-02-04 | Discharge: 2023-02-04 | Disposition: A | Discharge status: Home / Self Care | Payer: Medicare Other | Source: Ambulatory Visit | Attending: Radiation Oncology | Admitting: Radiation Oncology

## 2023-02-04 ENCOUNTER — Other Ambulatory Visit: Payer: Self-pay

## 2023-02-04 DIAGNOSIS — Z510 Encounter for antineoplastic radiation therapy: Secondary | ICD-10-CM | POA: Diagnosis not present

## 2023-02-04 LAB — RAD ONC ARIA SESSION SUMMARY
Course Elapsed Days: 16
Plan Fractions Treated to Date: 13
Plan Prescribed Dose Per Fraction: 1.8 Gy
Plan Total Fractions Prescribed: 25
Plan Total Prescribed Dose: 45 Gy
Reference Point Dosage Given to Date: 23.4 Gy
Reference Point Session Dosage Given: 1.8 Gy
Session Number: 13

## 2023-02-05 ENCOUNTER — Encounter: Payer: Self-pay | Admitting: Hematology

## 2023-02-05 ENCOUNTER — Other Ambulatory Visit: Payer: Self-pay

## 2023-02-05 ENCOUNTER — Ambulatory Visit
Admission: RE | Admit: 2023-02-05 | Discharge: 2023-02-05 | Disposition: A | Discharge status: Home / Self Care | Payer: Medicare Other | Source: Ambulatory Visit | Attending: Radiation Oncology | Admitting: Radiation Oncology

## 2023-02-05 ENCOUNTER — Ambulatory Visit: Payer: Medicare Other | Admitting: Nutrition

## 2023-02-05 DIAGNOSIS — Z510 Encounter for antineoplastic radiation therapy: Secondary | ICD-10-CM | POA: Diagnosis not present

## 2023-02-05 LAB — RAD ONC ARIA SESSION SUMMARY
Course Elapsed Days: 17
Plan Fractions Treated to Date: 14
Plan Prescribed Dose Per Fraction: 1.8 Gy
Plan Total Fractions Prescribed: 25
Plan Total Prescribed Dose: 45 Gy
Reference Point Dosage Given to Date: 25.2 Gy
Reference Point Session Dosage Given: 1.8 Gy
Session Number: 14

## 2023-02-05 NOTE — Progress Notes (Signed)
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 Beta-2 micro: 5.9 Normal immunoglogulin heavy chains Hgb 11.4 Creatinine 2.08 No hypercalcemia   09/09/2018 Imaging   Metastatic skeletal survey negative   09/13/2018 Bone Marrow Biopsy   Morphology (Accession: [id number]): Bone Marrow, Aspirate,Biopsy, and Clot - HYPERCELLULAR BONE MARROW WITH PLASMA CELL NEOPLASM (12% cells, IHC showed lambda light chain restriction) - TRILINEAGE HEMATOPOIESIS. - SEE COMMENT.  Flow cytometry (Accession: [id number]):  - NO MONOCLONAL B-CELL POPULATION IDENTIFIED. - PREDOMINANCE OF T LYMPHOCYTES AND NATURAL KILLER CELLS.   01/05/2023 Pathology Results    FINAL MICROSCOPIC DIAGNOSIS:   A. STOMACH, BIOPSY:  - Gastric mucosa with focally active chronic gastritis, see comment  - Multifocal intestinal metaplasia, negative for dysplasia   B. GE JUNCTION MASS, BIOPSY:  - Invasive moderately differentiated adenocarcinoma    01/07/2023 Imaging    IMPRESSION: 1. Distal esophageal stricture extending over about a  3.9 cm vertical excursion. No findings of metastatic disease to the chest, abdomen, or pelvis. 2. 0.5 cm sub solid nodule in the right middle lobe. Fleischner guidelines do not apply due to history of malignancy. Surveillance of the context of the patient's follow up oncology imaging is suggested. 3. Nonspecific lobulation of the left mid kidney. This could be incidental or due to CT equivocal end of a dromedary hump but I cannot exclude an underlying left mid kidney mass. This could be surveilled in the context of the patient's follow up oncology imaging, or if clinically warranted, renal MRI with and without contrast could be utilized for further characterization. 4. L1 compression fracture with 90% anterior compression and 6 mm of posterior bony retropulsion, likely late subacute chronicity. 5. Wall thickening in the proximal colon extending to the transverse colon, cannot exclude proximal colitis. 6. Other imaging findings of potential clinical significance: Coronary, aortic arch, and branch vessel atherosclerotic disease. Mitral and aortic valve calcification. Small type 1 hiatal hernia along the lower margin of the mass. Central airway thickening is present, suggesting bronchitis or reactive airways disease. Fatty deposition in the wall of the ascending colon may be incidental but which can have a weak association with inflammatory bowel disease. Lumbar spondylosis and degenerative disc disease contributing to multilevel impingement. 6 mm left kidney lower pole nonobstructive renal calculus.   Esophageal cancer ([place])  01/05/2023 Pathology Results    FINAL MICROSCOPIC DIAGNOSIS:   A. STOMACH, BIOPSY:  - Gastric mucosa with focally active chronic gastritis, see comment  - Multifocal intestinal metaplasia, negative for dysplasia   B. GE JUNCTION MASS, BIOPSY:  - Invasive moderately differentiated adenocarcinoma    01/07/2023 Imaging  IMPRESSION: 1. Distal esophageal  stricture extending over about a 3.9 cm vertical excursion. No findings of metastatic disease to the chest, abdomen, or pelvis. 2. 0.5 cm sub solid nodule in the right middle lobe. Fleischner guidelines do not apply due to history of malignancy. Surveillance of the context of the patient's follow up oncology imaging is suggested. 3. Nonspecific lobulation of the left mid kidney. This could be incidental or due to CT equivocal end of a dromedary hump but I cannot exclude an underlying left mid kidney mass. This could be surveilled in the context of the patient's follow up oncology imaging, or if clinically warranted, renal MRI with and without contrast could be utilized for further characterization. 4. L1 compression fracture with 90% anterior compression and 6 mm of posterior bony retropulsion, likely late subacute chronicity. 5. Wall thickening in the proximal colon extending to the transverse colon, cannot exclude proximal colitis. 6. Other imaging findings of potential clinical significance: Coronary, aortic arch, and branch vessel atherosclerotic disease. Mitral and aortic valve calcification. Small type 1 hiatal hernia along the lower margin of the mass. Central airway thickening is present, suggesting bronchitis or reactive airways disease. Fatty deposition in the wall of the ascending colon may be incidental but which can have a weak association with inflammatory bowel disease. Lumbar spondylosis and degenerative disc disease contributing to multilevel impingement. 6 mm left kidney lower pole nonobstructive renal calculus.     01/09/2023 Initial Diagnosis   Esophageal cancer ([place])      INTERVAL HISTORY:  Daniel [name] is here for a follow up of Esophageal Cancer  He was last seen by Dr. Burr Medico on 2/152024 He presents to the clinic accompanied by daughter.   Patient reports that he is tolerating radiation well. He reports that his nutrition is predominantly through his PEG  tube. His weight is stable. He does swallow his medications by mouth. He denies any odynophagia with radiation. He denies nausea, vomiting or abdominal pain. He reports constipation with a bowel movement every 3-4 days.He denies easy bruising or signs of active bleeding. He denies fevers, chills, sweats, shortness of breath, chest pain or cough.   All other systems were reviewed with the patient and are negative.  MEDICAL HISTORY:  Past Medical History:  Diagnosis Date   Blood transfusion    CKD (chronic kidney disease) stage 4, GFR 15-29 ml/min (HCC)    DVT (deep venous thrombosis) (HCC)    from surgery, no longer on Eliquis   Gout    Hearing loss    Hypertension    Hypertriglyceridemia    Hypothyroid    Prediabetes    Prostate cancer ([place])    Pulmonary nodule    Tinnitus     SURGICAL HISTORY: Past Surgical History:  Procedure Laterality Date   BIOPSY  01/05/2023   Procedure: BIOPSY;  Surgeon: Irving Copas., MD;  Location: [facility];  Service: Gastroenterology;;   CATARACT EXTRACTION Bilateral 2020   ESOPHAGOGASTRODUODENOSCOPY (EGD) WITH PROPOFOL N/A 01/05/2023   Procedure: ESOPHAGOGASTRODUODENOSCOPY (EGD) WITH PROPOFOL;  Surgeon: Irving Copas., MD;  Location: [facility];  Service: Gastroenterology;  Laterality: N/A;   FOREIGN BODY REMOVAL  01/05/2023   Procedure: FOREIGN BODY REMOVAL;  Surgeon: Rush Landmark Telford Nab., MD;  Location: [facility];  Service: Gastroenterology;;   HERNIA REPAIR Right 2010   IR GASTROSTOMY TUBE MOD SED  01/20/2023   IR NASO G TUBE PLC W/FL W/RAD  01/20/2023   left inguinal hernia repair  06/29/2008   with mesh sail  PROSTATECTOMY  1998   prostate cancer   right ankle  1980   fracture   right knee  1980   torn meniscus   TOTAL KNEE ARTHROPLASTY Right 01/20/2022   Procedure: TOTAL KNEE ARTHROPLASTY;  Surgeon: Vickey Huger, MD;  Location: [facility];  Service: Orthopedics;  Laterality: Right;    I have reviewed the social  history and family history with the patient and they are unchanged from previous note.  ALLERGIES:  has No Known Allergies.  MEDICATIONS:  Current Outpatient Medications  Medication Sig Dispense Refill   allopurinol (ZYLOPRIM) 100 MG tablet Take 100 mg by mouth every Monday, Wednesday, and Friday.     ALPRAZolam (XANAX) 0.5 MG tablet Take 0.5 mg by mouth 3 (three) times daily as needed for sleep (tremors).     amLODipine (NORVASC) 5 MG tablet Take 5 mg by mouth daily.     apixaban (ELIQUIS) 2.5 MG TABS tablet Take 1 tablet (2.5 mg total) by mouth 2 (two) times daily. 180 tablet 3   cholecalciferol (VITAMIN D) 1000 units tablet Take 1,000 Units by mouth daily.     levothyroxine (SYNTHROID, LEVOTHROID) 100 MCG tablet Take 100 mcg by mouth daily before breakfast.     metoprolol succinate (TOPROL XL) 25 MG 24 hr tablet Take 0.5 tablets (12.5 mg total) by mouth daily. 45 tablet 3   omeprazole (PRILOSEC) 40 MG capsule Open 1 capsule and sprinkle in apple sauce twice daily. 60 capsule 2   oxyCODONE (ROXICODONE) 5 MG/5ML solution Place 5 mLs (5 mg total) into feeding tube every 8 (eight) hours. 100 mL 0   sucralfate (CARAFATE) 1 GM/10ML suspension Take 10 mLs (1 g total) by mouth 2 (two) times daily. 420 mL 1   No current facility-administered medications for this visit.    PHYSICAL EXAMINATION: ECOG PERFORMANCE STATUS: 2 - Symptomatic, <50% confined to bed  Vitals:   02/03/23 1505  BP: 111/66  Pulse: 78  Resp: 18  Temp: 97.8 F (36.6 C)  SpO2: 100%   Wt Readings from Last 3 Encounters:  02/03/23 131 lb 9.6 oz (59.7 kg)  01/29/23 131 lb 11.2 oz (59.7 kg)  01/26/23 130 lb 12.8 oz (59.3 kg)     GENERAL:alert, no distress and comfortable SKIN: skin color normal, no rashes or significant lesions EYES: normal, Conjunctiva are pink and non-injected, sclera clear  NEURO: alert & oriented x 3 with fluent speech   LABORATORY DATA:  I have reviewed the data as listed    Latest Ref Rng &  Units 01/29/2023    1:18 PM 01/26/2023    2:12 PM 01/20/2023    8:33 AM  CBC  WBC 4.0 - 10.5 K/uL 4.4  4.8  6.9   Hemoglobin 13.0 - 17.0 g/dL 12.0  12.8  13.3   Hematocrit 39.0 - 52.0 % 34.0  36.4  39.3   Platelets 150 - 400 K/uL 218  224  232         Latest Ref Rng & Units 01/29/2023    1:18 PM 01/26/2023    2:12 PM 01/22/2023   12:32 PM  CMP  Glucose 70 - 99 mg/dL 139  114  120   BUN 8 - 23 mg/dL 33  31  29   Creatinine 0.61 - 1.24 mg/dL 1.42  1.50  1.60   Sodium 135 - 145 mmol/L 137  137  139   Potassium 3.5 - 5.1 mmol/L 4.8  4.8  4.6   Chloride 98 - 111 mmol/L  104  104  105   CO2 22 - 32 mmol/L 27  30  27   $ Calcium 8.9 - 10.3 mg/dL 8.5  8.9  9.1   Total Protein 6.5 - 8.1 g/dL 5.2  5.6  5.4   Total Bilirubin 0.3 - 1.2 mg/dL 0.4  0.5  1.0   Alkaline Phos 38 - 126 U/L 69  72  61   AST 15 - 41 U/L 18  21  28   $ ALT 0 - 44 U/L 6  7  6       $ RADIOGRAPHIC STUDIES: I have personally reviewed the radiological images as listed and agreed with the findings in the report. No results found.    ASSESSMENT:  Merlen [name] is a 89 y.o. male with   #Esophageal cancer ([place]), stage IV -diagnosed in 12/2022 -PET scan from January 04, 2023, which showed hypermetabolic distal esophageal uptake, and hypermetabolic mediastinal and bilateral hilar adenopathy, right pubic bone and anterior left sixth ribs, concerning for metastatic disease.   -Started palliative radiation on 01/19/2023. Total dose is 45 Gy in 25 Fx.  -Molecular testing shows MMR proficient, PD-L1 positive (CPS 25%), HER2 status pending.  -Patient is not a candidate for concurrent chemo with radiation, due to his advanced age  #Constipation: --Recommend to try OTC miralax   #Dysphagia:  --Continue on PET tube feedings for nutriton  PLAN: -Presents for a toxicity check while receiving palliative radiation. Completed 12 out of 25 Fx today -Patient is tolerating radiation without any significant limitations.  -I discussed role  of immunotherapy with his PD-L1 status. Awaiting HER2 status. Dr. Burr Medico will finalize recommendations for systemic therapy after he is finished with radiation.  -Continue with weekly IV fluids -RTC on 02/18/2022 for labs and toxicity check with Cira Rue, NP  No orders of the defined types were placed in this encounter.  All questions were answered. The patient knows to call the clinic with any problems, questions or concerns. No barriers to learning was detected.  I have spent a total of 25 minutes minutes of face-to-face and non-face-to-face time, preparing to see the patient, performing a medically appropriate examination, counseling and educating the patient, documenting clinical information in the electronic health record, and care coordination.   Dede Query PA-C Dept of Hematology and [place] at [facility] Phone: [phone]

## 2023-02-05 NOTE — Progress Notes (Signed)
Provided 3 cases of [employer] 1.4 for patient.

## 2023-02-06 ENCOUNTER — Inpatient Hospital Stay: Payer: Medicare Other

## 2023-02-06 ENCOUNTER — Other Ambulatory Visit: Payer: Self-pay

## 2023-02-06 ENCOUNTER — Encounter: Payer: Self-pay | Admitting: Hematology

## 2023-02-06 ENCOUNTER — Ambulatory Visit: Payer: Medicare Other

## 2023-02-06 ENCOUNTER — Ambulatory Visit
Admission: RE | Admit: 2023-02-06 | Discharge: 2023-02-06 | Disposition: A | Discharge status: Home / Self Care | Payer: Medicare Other | Source: Ambulatory Visit | Attending: Radiation Oncology | Admitting: Radiation Oncology

## 2023-02-06 VITALS — BP 121/61 | HR 71 | Temp 98.7°F | Resp 16

## 2023-02-06 DIAGNOSIS — Z510 Encounter for antineoplastic radiation therapy: Secondary | ICD-10-CM | POA: Diagnosis not present

## 2023-02-06 DIAGNOSIS — D539 Nutritional anemia, unspecified: Secondary | ICD-10-CM

## 2023-02-06 LAB — RAD ONC ARIA SESSION SUMMARY
Course Elapsed Days: 18
Plan Fractions Treated to Date: 15
Plan Prescribed Dose Per Fraction: 1.8 Gy
Plan Total Fractions Prescribed: 25
Plan Total Prescribed Dose: 45 Gy
Reference Point Dosage Given to Date: 27 Gy
Reference Point Session Dosage Given: 1.8 Gy
Session Number: 15

## 2023-02-06 MED ORDER — SODIUM CHLORIDE 0.9 % IV SOLN: Freq: Once | INTRAVENOUS | Status: AC

## 2023-02-09 ENCOUNTER — Other Ambulatory Visit: Payer: Self-pay

## 2023-02-09 ENCOUNTER — Ambulatory Visit
Admission: RE | Admit: 2023-02-09 | Discharge: 2023-02-09 | Disposition: A | Discharge status: Home / Self Care | Payer: Medicare Other | Source: Ambulatory Visit | Attending: Radiation Oncology | Admitting: Radiation Oncology

## 2023-02-09 ENCOUNTER — Encounter: Payer: Medicare Other | Admitting: Nutrition

## 2023-02-09 DIAGNOSIS — Z510 Encounter for antineoplastic radiation therapy: Secondary | ICD-10-CM | POA: Diagnosis not present

## 2023-02-09 LAB — RAD ONC ARIA SESSION SUMMARY
Course Elapsed Days: 21
Plan Fractions Treated to Date: 16
Plan Prescribed Dose Per Fraction: 1.8 Gy
Plan Total Fractions Prescribed: 25
Plan Total Prescribed Dose: 45 Gy
Reference Point Dosage Given to Date: 28.8 Gy
Reference Point Session Dosage Given: 1.8 Gy
Session Number: 16

## 2023-02-10 ENCOUNTER — Ambulatory Visit
Admission: RE | Admit: 2023-02-10 | Discharge: 2023-02-10 | Disposition: A | Discharge status: Home / Self Care | Payer: Medicare Other | Source: Ambulatory Visit | Attending: Radiation Oncology | Admitting: Radiation Oncology

## 2023-02-10 ENCOUNTER — Other Ambulatory Visit: Payer: Self-pay

## 2023-02-10 ENCOUNTER — Inpatient Hospital Stay: Payer: Medicare Other | Admitting: Dietician

## 2023-02-10 DIAGNOSIS — Z510 Encounter for antineoplastic radiation therapy: Secondary | ICD-10-CM | POA: Diagnosis not present

## 2023-02-10 LAB — RAD ONC ARIA SESSION SUMMARY
Course Elapsed Days: 22
Plan Fractions Treated to Date: 17
Plan Prescribed Dose Per Fraction: 1.8 Gy
Plan Total Fractions Prescribed: 25
Plan Total Prescribed Dose: 45 Gy
Reference Point Dosage Given to Date: 30.6 Gy
Reference Point Session Dosage Given: 1.8 Gy
Session Number: 17

## 2023-02-10 NOTE — Progress Notes (Signed)
Nutrition Follow-up:  Patient with esophageal cancer. He is receiving radiation therapy. S/p PEG   Met with patient and his two daughters following radiation. Patient reports he is tolerating one carton [employer] three times daily (1350 kcal, 60 gram protein, 702 ml free water). Patient is flushing tube with 60 ml water before and 120 ml water after. Patient has not tried increasing tube feedings. He reports he does not sleep well on a full stomach. Patient denies nausea, vomiting, diarrhea. Constipation is improving with bowel regimen and hydration.     Medications: reviewed  Labs: no new labs for review   Anthropometrics: Last wt 131 lb 9.6 oz on 2/20 - stable   2/15 - 131 lb 11.2 oz 2/06 - 128 lb    NUTRITION DIAGNOSIS: Inadequate oral intake continues - relying on TF    INTERVENTION:  Continue strategies for increasing tube feedings towards goal of 4 cartons [employer] 1.4 Pt agreeable to try adding one half carton [employer] at [facility] with 60 ml water before and after Continue supportive care with IV fluids Provided 4 cases [employer] 1.4, box of split guaze, mesh briefs, syringes x 4    MONITORING, EVALUATION, GOAL: weight trends, tube feeding   NEXT VISIT: Thursday March 7 during infusion with Pamala Hurry

## 2023-02-11 ENCOUNTER — Ambulatory Visit
Admission: RE | Admit: 2023-02-11 | Discharge: 2023-02-11 | Disposition: A | Discharge status: Home / Self Care | Payer: Medicare Other | Source: Ambulatory Visit | Attending: Radiation Oncology | Admitting: Radiation Oncology

## 2023-02-11 ENCOUNTER — Other Ambulatory Visit: Payer: Self-pay

## 2023-02-11 DIAGNOSIS — Z510 Encounter for antineoplastic radiation therapy: Secondary | ICD-10-CM | POA: Diagnosis not present

## 2023-02-11 LAB — RAD ONC ARIA SESSION SUMMARY
Course Elapsed Days: 23
Plan Fractions Treated to Date: 18
Plan Prescribed Dose Per Fraction: 1.8 Gy
Plan Total Fractions Prescribed: 25
Plan Total Prescribed Dose: 45 Gy
Reference Point Dosage Given to Date: 32.4 Gy
Reference Point Session Dosage Given: 1.8 Gy
Session Number: 18

## 2023-02-12 ENCOUNTER — Other Ambulatory Visit: Payer: Self-pay

## 2023-02-12 ENCOUNTER — Ambulatory Visit
Admission: RE | Admit: 2023-02-12 | Discharge: 2023-02-12 | Disposition: A | Discharge status: Home / Self Care | Payer: Medicare Other | Source: Ambulatory Visit | Attending: Radiation Oncology | Admitting: Radiation Oncology

## 2023-02-12 DIAGNOSIS — Z510 Encounter for antineoplastic radiation therapy: Secondary | ICD-10-CM | POA: Diagnosis not present

## 2023-02-12 LAB — RAD ONC ARIA SESSION SUMMARY
Course Elapsed Days: 24
Plan Fractions Treated to Date: 19
Plan Prescribed Dose Per Fraction: 1.8 Gy
Plan Total Fractions Prescribed: 25
Plan Total Prescribed Dose: 45 Gy
Reference Point Dosage Given to Date: 34.2 Gy
Reference Point Session Dosage Given: 1.8 Gy
Session Number: 19

## 2023-02-13 ENCOUNTER — Ambulatory Visit
Admission: RE | Admit: 2023-02-13 | Discharge: 2023-02-13 | Disposition: A | Discharge status: Home / Self Care | Payer: Medicare Other | Source: Ambulatory Visit | Attending: Radiation Oncology | Admitting: Radiation Oncology

## 2023-02-13 ENCOUNTER — Other Ambulatory Visit: Payer: Self-pay

## 2023-02-13 ENCOUNTER — Encounter: Payer: Medicare Other | Admitting: Dietician

## 2023-02-13 ENCOUNTER — Inpatient Hospital Stay: Payer: Medicare Other | Attending: Physician Assistant

## 2023-02-13 VITALS — BP 130/63 | HR 73 | Temp 97.5°F | Resp 18

## 2023-02-13 DIAGNOSIS — C155 Malignant neoplasm of lower third of esophagus: Secondary | ICD-10-CM | POA: Insufficient documentation

## 2023-02-13 DIAGNOSIS — I129 Hypertensive chronic kidney disease with stage 1 through stage 4 chronic kidney disease, or unspecified chronic kidney disease: Secondary | ICD-10-CM | POA: Insufficient documentation

## 2023-02-13 DIAGNOSIS — I4891 Unspecified atrial fibrillation: Secondary | ICD-10-CM | POA: Insufficient documentation

## 2023-02-13 DIAGNOSIS — Z5189 Encounter for other specified aftercare: Secondary | ICD-10-CM | POA: Diagnosis not present

## 2023-02-13 DIAGNOSIS — D649 Anemia, unspecified: Secondary | ICD-10-CM | POA: Diagnosis not present

## 2023-02-13 DIAGNOSIS — Z510 Encounter for antineoplastic radiation therapy: Secondary | ICD-10-CM | POA: Insufficient documentation

## 2023-02-13 DIAGNOSIS — N184 Chronic kidney disease, stage 4 (severe): Secondary | ICD-10-CM | POA: Insufficient documentation

## 2023-02-13 DIAGNOSIS — R1310 Dysphagia, unspecified: Secondary | ICD-10-CM | POA: Insufficient documentation

## 2023-02-13 DIAGNOSIS — Z931 Gastrostomy status: Secondary | ICD-10-CM | POA: Insufficient documentation

## 2023-02-13 DIAGNOSIS — C9000 Multiple myeloma not having achieved remission: Secondary | ICD-10-CM | POA: Insufficient documentation

## 2023-02-13 DIAGNOSIS — D539 Nutritional anemia, unspecified: Secondary | ICD-10-CM | POA: Insufficient documentation

## 2023-02-13 DIAGNOSIS — R1319 Other dysphagia: Secondary | ICD-10-CM | POA: Insufficient documentation

## 2023-02-13 DIAGNOSIS — C7951 Secondary malignant neoplasm of bone: Secondary | ICD-10-CM | POA: Insufficient documentation

## 2023-02-13 DIAGNOSIS — Z23 Encounter for immunization: Secondary | ICD-10-CM | POA: Insufficient documentation

## 2023-02-13 LAB — RAD ONC ARIA SESSION SUMMARY
Course Elapsed Days: 25
Plan Fractions Treated to Date: 20
Plan Prescribed Dose Per Fraction: 1.8 Gy
Plan Total Fractions Prescribed: 25
Plan Total Prescribed Dose: 45 Gy
Reference Point Dosage Given to Date: 36 Gy
Reference Point Session Dosage Given: 1.8 Gy
Session Number: 20

## 2023-02-13 MED ORDER — SODIUM CHLORIDE 0.9 % IV SOLN: Freq: Once | INTRAVENOUS | Status: AC

## 2023-02-13 NOTE — Patient Instructions (Addendum)

## 2023-02-16 ENCOUNTER — Ambulatory Visit
Admission: RE | Admit: 2023-02-16 | Discharge: 2023-02-16 | Disposition: A | Discharge status: Home / Self Care | Payer: Medicare Other | Source: Ambulatory Visit | Attending: Radiation Oncology | Admitting: Radiation Oncology

## 2023-02-16 ENCOUNTER — Other Ambulatory Visit: Payer: Self-pay

## 2023-02-16 DIAGNOSIS — Z510 Encounter for antineoplastic radiation therapy: Secondary | ICD-10-CM | POA: Diagnosis not present

## 2023-02-16 LAB — RAD ONC ARIA SESSION SUMMARY
Course Elapsed Days: 28
Plan Fractions Treated to Date: 21
Plan Prescribed Dose Per Fraction: 1.8 Gy
Plan Total Fractions Prescribed: 25
Plan Total Prescribed Dose: 45 Gy
Reference Point Dosage Given to Date: 37.8 Gy
Reference Point Session Dosage Given: 1.8 Gy
Session Number: 21

## 2023-02-17 ENCOUNTER — Other Ambulatory Visit: Payer: Self-pay

## 2023-02-17 ENCOUNTER — Ambulatory Visit: Payer: Medicare Other | Admitting: Physician Assistant

## 2023-02-17 ENCOUNTER — Ambulatory Visit
Admission: RE | Admit: 2023-02-17 | Discharge: 2023-02-17 | Disposition: A | Discharge status: Home / Self Care | Payer: Medicare Other | Source: Ambulatory Visit | Attending: Radiation Oncology | Admitting: Radiation Oncology

## 2023-02-17 DIAGNOSIS — Z510 Encounter for antineoplastic radiation therapy: Secondary | ICD-10-CM | POA: Diagnosis not present

## 2023-02-17 LAB — RAD ONC ARIA SESSION SUMMARY
Course Elapsed Days: 29
Plan Fractions Treated to Date: 22
Plan Prescribed Dose Per Fraction: 1.8 Gy
Plan Total Fractions Prescribed: 25
Plan Total Prescribed Dose: 45 Gy
Reference Point Dosage Given to Date: 39.6 Gy
Reference Point Session Dosage Given: 1.8 Gy
Session Number: 22

## 2023-02-18 ENCOUNTER — Ambulatory Visit
Admission: RE | Admit: 2023-02-18 | Discharge: 2023-02-18 | Disposition: A | Discharge status: Home / Self Care | Payer: Medicare Other | Source: Ambulatory Visit | Attending: Radiation Oncology | Admitting: Radiation Oncology

## 2023-02-18 ENCOUNTER — Other Ambulatory Visit: Payer: Self-pay

## 2023-02-18 DIAGNOSIS — Z510 Encounter for antineoplastic radiation therapy: Secondary | ICD-10-CM | POA: Diagnosis not present

## 2023-02-18 LAB — RAD ONC ARIA SESSION SUMMARY
Course Elapsed Days: 30
Plan Fractions Treated to Date: 23
Plan Prescribed Dose Per Fraction: 1.8 Gy
Plan Total Fractions Prescribed: 25
Plan Total Prescribed Dose: 45 Gy
Reference Point Dosage Given to Date: 41.4 Gy
Reference Point Session Dosage Given: 1.8 Gy
Session Number: 23

## 2023-02-18 NOTE — Progress Notes (Unsigned)
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 Beta-2 micro: 5.9 Normal immunoglogulin heavy chains Hgb 11.4 Creatinine 2.08 No hypercalcemia   09/09/2018 Imaging   Metastatic skeletal survey negative   09/13/2018 Bone Marrow Biopsy   Morphology (Accession: [id number]): Bone Marrow, Aspirate,Biopsy, and Clot - HYPERCELLULAR BONE MARROW WITH PLASMA CELL NEOPLASM (12% cells, IHC showed lambda light chain restriction) - TRILINEAGE HEMATOPOIESIS. - SEE COMMENT.  Flow cytometry (Accession: [id number]):  - NO MONOCLONAL B-CELL POPULATION IDENTIFIED. - PREDOMINANCE OF T LYMPHOCYTES AND NATURAL KILLER CELLS.   01/05/2023 Pathology Results    FINAL MICROSCOPIC DIAGNOSIS:   A. STOMACH, BIOPSY:  - Gastric mucosa with focally active chronic gastritis, see comment  - Multifocal intestinal metaplasia, negative for dysplasia   B. GE JUNCTION MASS, BIOPSY:  - Invasive moderately differentiated adenocarcinoma    01/07/2023 Imaging    IMPRESSION: 1. Distal esophageal stricture extending over about a 3.9 cm vertical excursion. No findings of metastatic disease to the chest, abdomen, or pelvis. 2. 0.5 cm sub solid nodule in the right middle lobe. Fleischner guidelines do not apply due to  history of malignancy. Surveillance of the context of the patient's follow up oncology imaging is suggested. 3. Nonspecific lobulation of the left mid kidney. This could be incidental or due to CT equivocal end of a dromedary hump but I cannot exclude an underlying left mid kidney mass. This could be surveilled in the context of the patient's follow up oncology imaging, or if clinically warranted, renal MRI with and without contrast could be utilized for further characterization. 4. L1 compression fracture with 90% anterior compression and 6 mm of posterior bony retropulsion, likely late subacute chronicity. 5. Wall thickening in the proximal colon extending to the transverse colon, cannot exclude proximal colitis. 6. Other imaging findings of potential clinical significance: Coronary, aortic arch, and branch vessel atherosclerotic disease. Mitral and aortic valve calcification. Small type 1 hiatal hernia along the lower margin of the mass. Central airway thickening is present, suggesting bronchitis or reactive airways disease. Fatty deposition in the wall of the ascending colon may be incidental but which can have a weak association with inflammatory bowel disease. Lumbar spondylosis and degenerative disc disease contributing to multilevel impingement. 6 mm left kidney lower pole nonobstructive renal calculus.   Esophageal cancer ([place])  01/05/2023 Pathology Results    FINAL MICROSCOPIC DIAGNOSIS:   A. STOMACH, BIOPSY:  - Gastric mucosa with focally active chronic gastritis, see comment  - Multifocal intestinal metaplasia, negative for dysplasia   B. GE JUNCTION MASS, BIOPSY:  - Invasive moderately differentiated adenocarcinoma    01/07/2023 Imaging    IMPRESSION: 1. Distal esophageal stricture extending over about a 3.9 cm vertical excursion. No findings of metastatic disease to the chest, abdomen, or pelvis. 2. 0.5 cm sub solid nodule in the  right middle lobe.  Fleischner guidelines do not apply due to history of malignancy. Surveillance of the context of the patient's follow up oncology imaging is suggested. 3. Nonspecific lobulation of the left mid kidney. This could be incidental or due to CT equivocal end of a dromedary hump but I cannot exclude an underlying left mid kidney mass. This could be surveilled in the context of the patient's follow up oncology imaging, or if clinically warranted, renal MRI with and without contrast could be utilized for further characterization. 4. L1 compression fracture with 90% anterior compression and 6 mm of posterior bony retropulsion, likely late subacute chronicity. 5. Wall thickening in the proximal colon extending to the transverse colon, cannot exclude proximal colitis. 6. Other imaging findings of potential clinical significance: Coronary, aortic arch, and branch vessel atherosclerotic disease. Mitral and aortic valve calcification. Small type 1 hiatal hernia along the lower margin of the mass. Central airway thickening is present, suggesting bronchitis or reactive airways disease. Fatty deposition in the wall of the ascending colon may be incidental but which can have a weak association with inflammatory bowel disease. Lumbar spondylosis and degenerative disc disease contributing to multilevel impingement. 6 mm left kidney lower pole nonobstructive renal calculus.     01/09/2023 Initial Diagnosis   Esophageal cancer ([place])      CURRENT THERAPY: Radiation and supportive care  INTERVAL HISTORY Daniel [name] returns for follow-up as scheduled, last seen by my colleague [place], [place] 02/03/2023.  He continues daily radiation, final date expected 3/13. Swallowing has improved since starting RT but stable in the past week or so, drinking water and broth with some dysphagia. Odynophagia is "not like it was." Using feeding tube for all nutrition. No pain/leakage at the site. Bowels moving with laxatives PRN.    ROS  All other systems reviewed and negative   Past Medical History:  Diagnosis Date   Blood transfusion    CKD (chronic kidney disease) stage 4, GFR 15-29 ml/min (HCC)    DVT (deep venous thrombosis) (HCC)    from surgery, no longer on Eliquis   Gout    Hearing loss    Hypertension    Hypertriglyceridemia    Hypothyroid    Prediabetes    Prostate cancer [facility])    Pulmonary nodule    Tinnitus      Past Surgical History:  Procedure Laterality Date   BIOPSY  01/05/2023   Procedure: BIOPSY;  Surgeon: Irving Copas., MD;  Location: [facility];  Service: Gastroenterology;;   CATARACT EXTRACTION Bilateral 2020   ESOPHAGOGASTRODUODENOSCOPY (EGD) WITH PROPOFOL N/A 01/05/2023   Procedure: ESOPHAGOGASTRODUODENOSCOPY (EGD) WITH PROPOFOL;  Surgeon: Irving Copas., MD;  Location: [facility];  Service: Gastroenterology;  Laterality: N/A;   FOREIGN BODY REMOVAL  01/05/2023   Procedure: FOREIGN BODY REMOVAL;  Surgeon: Rush Landmark Telford Nab., MD;  Location: [facility];  Service: Gastroenterology;;   HERNIA REPAIR Right 2010   IR GASTROSTOMY TUBE MOD SED  01/20/2023   IR NASO G TUBE PLC W/FL W/RAD  01/20/2023   left inguinal hernia repair  06/29/2008   with mesh sail   PROSTATECTOMY  1998   prostate cancer   right ankle  1980   fracture   right knee  1980   torn meniscus   TOTAL KNEE ARTHROPLASTY Right 01/20/2022   Procedure: TOTAL KNEE ARTHROPLASTY;  Surgeon: Vickey Huger, MD;  Location: [facility];  Service: Orthopedics;  Laterality: Right;     Outpatient Encounter Medications as of 02/19/2023  Medication Sig  allopurinol (ZYLOPRIM) 100 MG tablet Take 100 mg by mouth every Monday, Wednesday, and Friday.   ALPRAZolam (XANAX) 0.5 MG tablet Take 0.5 mg by mouth 3 (three) times daily as needed for sleep (tremors).   amLODipine (NORVASC) 5 MG tablet Take 5 mg by mouth daily.   apixaban (ELIQUIS) 2.5 MG TABS tablet Take 1 tablet (2.5 mg total) by mouth 2 (two) times daily.    cholecalciferol (VITAMIN D) 1000 units tablet Take 1,000 Units by mouth daily.   levothyroxine (SYNTHROID, LEVOTHROID) 100 MCG tablet Take 100 mcg by mouth daily before breakfast.   metoprolol succinate (TOPROL XL) 25 MG 24 hr tablet Take 0.5 tablets (12.5 mg total) by mouth daily.   omeprazole (PRILOSEC) 40 MG capsule Open 1 capsule and sprinkle in apple sauce twice daily.   oxyCODONE (ROXICODONE) 5 MG/5ML solution Place 5 mLs (5 mg total) into feeding tube every 8 (eight) hours.   sucralfate (CARAFATE) 1 GM/10ML suspension Take 10 mLs (1 g total) by mouth 2 (two) times daily.   No facility-administered encounter medications on file as of 02/19/2023.     Today's Vitals   02/19/23 1159 02/19/23 1249  BP: 107/64   Pulse: 79   Resp: 17   Temp: 98.6 F (37 C)   TempSrc: Oral   SpO2: (!) 79% 99%  Weight:  135 lb 4.8 oz (61.4 kg)   Body mass index is 22.17 kg/m.   PHYSICAL EXAM GENERAL:alert, no distress and comfortable SKIN: no rash  EYES: sclera clear LUNGS: clear with normal breathing effort HEART: regular rate & rhythm, no lower extremity edema ABDOMEN: abdomen soft, non-tender and normal bowel sounds. Feeding tube present, site c/d/i NEURO: alert & oriented x 3 with fluent speech     CBC    Component Value Date/Time   WBC 5.9 02/19/2023 1105   WBC 6.9 01/20/2023 0833   RBC 3.15 (L) 02/19/2023 1105   HGB 11.4 (L) 02/19/2023 1105   HCT 32.6 (L) 02/19/2023 1105   PLT 200 02/19/2023 1105   MCV 103.5 (H) 02/19/2023 1105   MCH 36.2 (H) 02/19/2023 1105   MCHC 35.0 02/19/2023 1105   RDW 15.3 02/19/2023 1105   LYMPHSABS 0.5 (L) 02/19/2023 1105   MONOABS 0.5 02/19/2023 1105   EOSABS 0.4 02/19/2023 1105   BASOSABS 0.0 02/19/2023 1105     CMP     Component Value Date/Time   NA 138 02/19/2023 1105   K 5.0 02/19/2023 1105   CL 106 02/19/2023 1105   CO2 26 02/19/2023 1105   GLUCOSE 114 (H) 02/19/2023 1105   BUN 46 (H) 02/19/2023 1105   CREATININE 1.37 (H) 02/19/2023  1105   CALCIUM 8.4 (L) 02/19/2023 1105   PROT 5.4 (L) 02/19/2023 1105   ALBUMIN 3.2 (L) 02/19/2023 1105   AST 85 (H) 02/19/2023 1105   ALT 75 (H) 02/19/2023 1105   ALKPHOS 54 02/19/2023 1105   BILITOT 0.4 02/19/2023 1105   GFRNONAA 48 (L) 02/19/2023 1105   GFRAA 38 (L) 09/22/2018 1311     ASSESSMENT & PLAN: Daniel [name] is a 89 y.o. male with    Esophageal cancer, stage IV, with nodal and bone metastasis; MMR normal, PD-L1 + (CPS 25%), HER 2 pending -diagnosed in 12/2022 -PET 01/04/23 showed hypermetabolic distal esophageal uptake, and hypermetabolic mediastinal and bilateral hilar adenopathy, right pubic bone and anterior left sixth ribs, concerning for metastatic disease.  -On palliative radiation starting 01/18/23, expected to complete 02/25/23 -Daniel. [name] appears stable. Tolerating RT moderately well.  Dysphagia and odynophagia have improved initially, and stabilized. Does not like to take pain meds -Remains completely reliant on tube feedings. F/up nutrition today -We discussed expected recovery trajectory with RT. I don't recommend escalating solids at this point, given that he still has some regurgitation with water/broth. May consider swallow study before re-introducing  -Labs reviewed, anemia slightly worse, Hgb 11.4, and new AST/ALT elevation. We reviewed possible sources of this. Will monitor closely -Continue daily RT, complete 3/13, and proceed with IVF today -lab and IVF weekly x3, and f/up in 3 weeks -HER2 is still pending    PLAN: -Labs reviewed, monitor LFTs closely -Continue lab and IVF weekly x3 -F/up nutrition today -F/up me or Dr. Burr Medico in 3 weeks -HER2 status still pending    All questions were answered. The patient knows to call the clinic with any problems, questions or concerns. No barriers to learning were detected.   Cira Rue, NP-C 02/16/2023

## 2023-02-19 ENCOUNTER — Inpatient Hospital Stay: Payer: Medicare Other

## 2023-02-19 ENCOUNTER — Encounter: Payer: Self-pay | Admitting: Nurse Practitioner

## 2023-02-19 ENCOUNTER — Other Ambulatory Visit: Payer: Self-pay

## 2023-02-19 ENCOUNTER — Inpatient Hospital Stay (HOSPITAL_BASED_OUTPATIENT_CLINIC_OR_DEPARTMENT_OTHER): Payer: Medicare Other | Admitting: Nurse Practitioner

## 2023-02-19 ENCOUNTER — Ambulatory Visit
Admission: RE | Admit: 2023-02-19 | Discharge: 2023-02-19 | Disposition: A | Discharge status: Home / Self Care | Payer: Medicare Other | Source: Ambulatory Visit | Attending: Radiation Oncology | Admitting: Radiation Oncology

## 2023-02-19 ENCOUNTER — Inpatient Hospital Stay: Payer: Medicare Other | Admitting: Nutrition

## 2023-02-19 VITALS — BP 108/60 | HR 71 | Resp 16

## 2023-02-19 VITALS — BP 107/64 | HR 79 | Temp 98.6°F | Resp 17 | Wt 135.3 lb

## 2023-02-19 DIAGNOSIS — C155 Malignant neoplasm of lower third of esophagus: Secondary | ICD-10-CM | POA: Diagnosis not present

## 2023-02-19 DIAGNOSIS — Z510 Encounter for antineoplastic radiation therapy: Secondary | ICD-10-CM | POA: Diagnosis not present

## 2023-02-19 DIAGNOSIS — R1319 Other dysphagia: Secondary | ICD-10-CM

## 2023-02-19 DIAGNOSIS — C159 Malignant neoplasm of esophagus, unspecified: Secondary | ICD-10-CM

## 2023-02-19 DIAGNOSIS — D539 Nutritional anemia, unspecified: Secondary | ICD-10-CM

## 2023-02-19 LAB — CBC WITH DIFFERENTIAL (CANCER CENTER ONLY)
Abs Immature Granulocytes: 0.03 10*3/uL (ref 0.00–0.07)
Basophils Absolute: 0 10*3/uL (ref 0.0–0.1)
Basophils Relative: 1 %
Eosinophils Absolute: 0.4 10*3/uL (ref 0.0–0.5)
Eosinophils Relative: 7 %
HCT: 32.6 % — ABNORMAL LOW (ref 39.0–52.0)
Hemoglobin: 11.4 g/dL — ABNORMAL LOW (ref 13.0–17.0)
Immature Granulocytes: 1 %
Lymphocytes Relative: 9 %
Lymphs Abs: 0.5 10*3/uL — ABNORMAL LOW (ref 0.7–4.0)
MCH: 36.2 pg — ABNORMAL HIGH (ref 26.0–34.0)
MCHC: 35 g/dL (ref 30.0–36.0)
MCV: 103.5 fL — ABNORMAL HIGH (ref 80.0–100.0)
Monocytes Absolute: 0.5 10*3/uL (ref 0.1–1.0)
Monocytes Relative: 9 %
Neutro Abs: 4.4 10*3/uL (ref 1.7–7.7)
Neutrophils Relative %: 73 %
Platelet Count: 200 10*3/uL (ref 150–400)
RBC: 3.15 MIL/uL — ABNORMAL LOW (ref 4.22–5.81)
RDW: 15.3 % (ref 11.5–15.5)
WBC Count: 5.9 10*3/uL (ref 4.0–10.5)
nRBC: 0 % (ref 0.0–0.2)

## 2023-02-19 LAB — CMP (CANCER CENTER ONLY)
ALT: 75 U/L — ABNORMAL HIGH (ref 0–44)
AST: 85 U/L — ABNORMAL HIGH (ref 15–41)
Albumin: 3.2 g/dL — ABNORMAL LOW (ref 3.5–5.0)
Alkaline Phosphatase: 54 U/L (ref 38–126)
Anion gap: 6 (ref 5–15)
BUN: 46 mg/dL — ABNORMAL HIGH (ref 8–23)
CO2: 26 mmol/L (ref 22–32)
Calcium: 8.4 mg/dL — ABNORMAL LOW (ref 8.9–10.3)
Chloride: 106 mmol/L (ref 98–111)
Creatinine: 1.37 mg/dL — ABNORMAL HIGH (ref 0.61–1.24)
GFR, Estimated: 48 mL/min — ABNORMAL LOW (ref >60)
Glucose, Bld: 114 mg/dL — ABNORMAL HIGH (ref 70–99)
Potassium: 5 mmol/L (ref 3.5–5.1)
Sodium: 138 mmol/L (ref 135–145)
Total Bilirubin: 0.4 mg/dL (ref 0.3–1.2)
Total Protein: 5.4 g/dL — ABNORMAL LOW (ref 6.5–8.1)

## 2023-02-19 LAB — RAD ONC ARIA SESSION SUMMARY
Course Elapsed Days: 31
Plan Fractions Treated to Date: 24
Plan Prescribed Dose Per Fraction: 1.8 Gy
Plan Total Fractions Prescribed: 25
Plan Total Prescribed Dose: 45 Gy
Reference Point Dosage Given to Date: 43.2 Gy
Reference Point Session Dosage Given: 1.8 Gy
Session Number: 24

## 2023-02-19 LAB — MAGNESIUM: Magnesium: 2.3 mg/dL (ref 1.7–2.4)

## 2023-02-19 LAB — PHOSPHORUS: Phosphorus: 4.6 mg/dL (ref 2.5–4.6)

## 2023-02-19 LAB — CEA (ACCESS): CEA (CHCC): 1.14 ng/mL (ref 0.00–5.00)

## 2023-02-19 MED ORDER — SODIUM CHLORIDE 0.9 % IV SOLN: Freq: Once | INTRAVENOUS | Status: AC

## 2023-02-19 NOTE — Patient Instructions (Signed)

## 2023-02-19 NOTE — Progress Notes (Signed)
Patient diagnosed with esophageal cancer and is status post PEG. Nutrition follow-up completed with patient and daughter during infusion.  Patient is receiving IV fluids.  Final radiation therapy is scheduled for March 13.    Weight was documented as 135.6 pounds on February 29.  This is improved from 131 pounds 9.6 ounces on February 20.  Labs include glucose 114, BUN 46, creatinine 1.37, albumin 3.2.  Daughter reports patient is consistently receiving and tolerating 4 cartons of [employer] 1.4 every day.  She continues to give free water flushes of about 60 mL free water before and after bolus feedings.  Patient is tolerating about 40 ounces of clear fluids by mouth.  He is very happy to be able to swallow and is anxious to try solid foods but was told to hold off on that for now.  Patient reports his bowels are moving "with some help. "He denies nausea and vomiting.  Reports he thinks he is getting stronger.  Estimated nutrition needs: 1700-1900 cal, 70-80 g protein, greater than 1.8 L fluid.  [employer] 1.4, 4 cartons, provides 1820 cal and 80 g of protein.  Nutrition diagnosis: Inadequate oral intake, ongoing.  Intervention: Continue 1 carton of [employer] 1.4-4 times daily with 60 mL of free water before and after bolus feeding. Continue clear liquids by mouth per MD. Will continue to provide sample cases of [employer] 1.4 and supplies as needed.  Patient's daughter prefers to pick these up next week when they come in for radiation therapy.  Monitoring, evaluation, goals: Patient will tolerate adequate tube feeding and water via tube or by mouth to meet estimated nutrition needs.  Next visit: To be scheduled in person or by telephone as needed.  **Disclaimer: This note was dictated with voice recognition software. Similar sounding words can inadvertently be transcribed and this note may contain transcription errors which may not have been corrected upon publication of note.**

## 2023-02-20 ENCOUNTER — Other Ambulatory Visit: Payer: Self-pay

## 2023-02-20 ENCOUNTER — Telehealth: Payer: Self-pay | Admitting: Hematology

## 2023-02-20 ENCOUNTER — Ambulatory Visit
Admission: RE | Admit: 2023-02-20 | Discharge: 2023-02-20 | Disposition: A | Discharge status: Home / Self Care | Payer: Medicare Other | Source: Ambulatory Visit | Attending: Radiation Oncology | Admitting: Radiation Oncology

## 2023-02-20 DIAGNOSIS — Z510 Encounter for antineoplastic radiation therapy: Secondary | ICD-10-CM | POA: Diagnosis not present

## 2023-02-20 LAB — RAD ONC ARIA SESSION SUMMARY
Course Elapsed Days: 32
Plan Fractions Treated to Date: 25
Plan Prescribed Dose Per Fraction: 1.8 Gy
Plan Total Fractions Prescribed: 25
Plan Total Prescribed Dose: 45 Gy
Reference Point Dosage Given to Date: 45 Gy
Reference Point Session Dosage Given: 1.8 Gy
Session Number: 25

## 2023-02-20 NOTE — Telephone Encounter (Signed)
Contacted patient to scheduled appointments. Left message with appointment details and a call back number if patient had any questions or could not accommodate the time we provided.   

## 2023-02-23 ENCOUNTER — Other Ambulatory Visit: Payer: Self-pay

## 2023-02-23 ENCOUNTER — Ambulatory Visit
Admission: RE | Admit: 2023-02-23 | Discharge: 2023-02-23 | Disposition: A | Discharge status: Home / Self Care | Payer: Medicare Other | Source: Ambulatory Visit | Attending: Radiation Oncology | Admitting: Radiation Oncology

## 2023-02-23 DIAGNOSIS — Z510 Encounter for antineoplastic radiation therapy: Secondary | ICD-10-CM | POA: Diagnosis not present

## 2023-02-23 LAB — RAD ONC ARIA SESSION SUMMARY
Course Elapsed Days: 35
Plan Fractions Treated to Date: 1
Plan Prescribed Dose Per Fraction: 1.8 Gy
Plan Total Fractions Prescribed: 3
Plan Total Prescribed Dose: 5.4 Gy
Reference Point Dosage Given to Date: 1.8 Gy
Reference Point Session Dosage Given: 1.8 Gy
Session Number: 26

## 2023-02-24 ENCOUNTER — Other Ambulatory Visit: Payer: Self-pay

## 2023-02-24 ENCOUNTER — Ambulatory Visit
Admission: RE | Admit: 2023-02-24 | Discharge: 2023-02-24 | Disposition: A | Discharge status: Home / Self Care | Payer: Medicare Other | Source: Ambulatory Visit | Attending: Radiation Oncology | Admitting: Radiation Oncology

## 2023-02-24 DIAGNOSIS — Z510 Encounter for antineoplastic radiation therapy: Secondary | ICD-10-CM | POA: Diagnosis not present

## 2023-02-24 LAB — RAD ONC ARIA SESSION SUMMARY
Course Elapsed Days: 36
Plan Fractions Treated to Date: 2
Plan Prescribed Dose Per Fraction: 1.8 Gy
Plan Total Fractions Prescribed: 3
Plan Total Prescribed Dose: 5.4 Gy
Reference Point Dosage Given to Date: 3.6 Gy
Reference Point Session Dosage Given: 1.8 Gy
Session Number: 27

## 2023-02-25 ENCOUNTER — Ambulatory Visit
Admission: RE | Admit: 2023-02-25 | Discharge: 2023-02-25 | Disposition: A | Discharge status: Home / Self Care | Payer: Medicare Other | Source: Ambulatory Visit | Attending: Radiation Oncology | Admitting: Radiation Oncology

## 2023-02-25 ENCOUNTER — Encounter: Payer: Self-pay | Admitting: Dietician

## 2023-02-25 ENCOUNTER — Other Ambulatory Visit: Payer: Self-pay

## 2023-02-25 ENCOUNTER — Encounter: Payer: Self-pay | Admitting: Radiation Oncology

## 2023-02-25 DIAGNOSIS — Z510 Encounter for antineoplastic radiation therapy: Secondary | ICD-10-CM | POA: Diagnosis not present

## 2023-02-25 LAB — RAD ONC ARIA SESSION SUMMARY
Course Elapsed Days: 37
Plan Fractions Treated to Date: 3
Plan Prescribed Dose Per Fraction: 1.8 Gy
Plan Total Fractions Prescribed: 3
Plan Total Prescribed Dose: 5.4 Gy
Reference Point Dosage Given to Date: 5.4 Gy
Reference Point Session Dosage Given: 1.8 Gy
Session Number: 28

## 2023-02-25 NOTE — Progress Notes (Signed)
Provided 4 cases [employer] 1.4 and syringes

## 2023-02-26 ENCOUNTER — Other Ambulatory Visit: Payer: Self-pay

## 2023-02-26 ENCOUNTER — Inpatient Hospital Stay: Payer: Medicare Other

## 2023-02-26 VITALS — BP 113/60 | HR 72 | Resp 16

## 2023-02-26 DIAGNOSIS — Z510 Encounter for antineoplastic radiation therapy: Secondary | ICD-10-CM | POA: Diagnosis not present

## 2023-02-26 DIAGNOSIS — C159 Malignant neoplasm of esophagus, unspecified: Secondary | ICD-10-CM

## 2023-02-26 DIAGNOSIS — D539 Nutritional anemia, unspecified: Secondary | ICD-10-CM

## 2023-02-26 LAB — CMP (CANCER CENTER ONLY)
ALT: 106 U/L — ABNORMAL HIGH (ref 0–44)
AST: 103 U/L — ABNORMAL HIGH (ref 15–41)
Albumin: 3.3 g/dL — ABNORMAL LOW (ref 3.5–5.0)
Alkaline Phosphatase: 61 U/L (ref 38–126)
Anion gap: 8 (ref 5–15)
BUN: 47 mg/dL — ABNORMAL HIGH (ref 8–23)
CO2: 24 mmol/L (ref 22–32)
Calcium: 8.4 mg/dL — ABNORMAL LOW (ref 8.9–10.3)
Chloride: 107 mmol/L (ref 98–111)
Creatinine: 1.43 mg/dL — ABNORMAL HIGH (ref 0.61–1.24)
GFR, Estimated: 45 mL/min — ABNORMAL LOW (ref >60)
Glucose, Bld: 126 mg/dL — ABNORMAL HIGH (ref 70–99)
Potassium: 5 mmol/L (ref 3.5–5.1)
Sodium: 139 mmol/L (ref 135–145)
Total Bilirubin: 0.4 mg/dL (ref 0.3–1.2)
Total Protein: 5.6 g/dL — ABNORMAL LOW (ref 6.5–8.1)

## 2023-02-26 LAB — CBC WITH DIFFERENTIAL (CANCER CENTER ONLY)
Abs Immature Granulocytes: 0.01 10*3/uL (ref 0.00–0.07)
Basophils Absolute: 0 10*3/uL (ref 0.0–0.1)
Basophils Relative: 1 %
Eosinophils Absolute: 0.4 10*3/uL (ref 0.0–0.5)
Eosinophils Relative: 7 %
HCT: 31.8 % — ABNORMAL LOW (ref 39.0–52.0)
Hemoglobin: 11.4 g/dL — ABNORMAL LOW (ref 13.0–17.0)
Immature Granulocytes: 0 %
Lymphocytes Relative: 10 %
Lymphs Abs: 0.5 10*3/uL — ABNORMAL LOW (ref 0.7–4.0)
MCH: 37.1 pg — ABNORMAL HIGH (ref 26.0–34.0)
MCHC: 35.8 g/dL (ref 30.0–36.0)
MCV: 103.6 fL — ABNORMAL HIGH (ref 80.0–100.0)
Monocytes Absolute: 0.5 10*3/uL (ref 0.1–1.0)
Monocytes Relative: 9 %
Neutro Abs: 3.8 10*3/uL (ref 1.7–7.7)
Neutrophils Relative %: 73 %
Platelet Count: 190 10*3/uL (ref 150–400)
RBC: 3.07 MIL/uL — ABNORMAL LOW (ref 4.22–5.81)
RDW: 16 % — ABNORMAL HIGH (ref 11.5–15.5)
WBC Count: 5.2 10*3/uL (ref 4.0–10.5)
nRBC: 0 % (ref 0.0–0.2)

## 2023-02-26 LAB — MAGNESIUM: Magnesium: 2.1 mg/dL (ref 1.7–2.4)

## 2023-02-26 LAB — PHOSPHORUS: Phosphorus: 4.1 mg/dL (ref 2.5–4.6)

## 2023-02-26 MED ORDER — SODIUM CHLORIDE 0.9 % IV SOLN: Freq: Once | INTRAVENOUS | Status: AC

## 2023-02-26 NOTE — Patient Instructions (Signed)

## 2023-02-27 LAB — SURGICAL PATHOLOGY

## 2023-03-02 ENCOUNTER — Encounter: Payer: Self-pay | Admitting: Hematology

## 2023-03-02 NOTE — Progress Notes (Signed)
                                                                                                                                                             Patient Name: Daniel [name] MRN: [id number] DOB: May 16, 1927 Referring Physician: Truitt Merle (Profile Not Attached) Date of Service: 02/25/2023 [name] [place], [place]                                                        End Of Treatment Note  Diagnoses: C15.5-Malignant neoplasm of lower third of esophagus  Cancer Staging: Adenocarcinoma of the distal esophagus.   Intent: Curative  Radiation Treatment Dates: 01/19/2023 through 02/25/2023 Site Technique Total Dose (Gy) Dose per Fx (Gy) Completed Fx Beam Energies  Esophagus: Esoph IMRT 45/45 1.8 25/25 6X  Esophagus: Esoph_Bst IMRT 5.4/5.4 1.8 3/3 6X   Narrative: The patient tolerated radiation therapy relatively well. He did have esophagitis and was using carafate. He also noted oral blisters during treatment. He also continued with enteral tube feeds through a PEG.   Plan: The patient will receive a call in about one month from the radiation oncology department. He will continue follow up with Dr. Burr Medico as well.   ________________________________________________    Carola Rhine, [facility]

## 2023-03-05 ENCOUNTER — Inpatient Hospital Stay: Payer: Medicare Other

## 2023-03-05 ENCOUNTER — Inpatient Hospital Stay: Payer: Medicare Other | Admitting: Nutrition

## 2023-03-05 VITALS — BP 112/60 | HR 78 | Temp 98.6°F | Resp 14 | Wt 136.0 lb

## 2023-03-05 DIAGNOSIS — Z510 Encounter for antineoplastic radiation therapy: Secondary | ICD-10-CM | POA: Diagnosis not present

## 2023-03-05 DIAGNOSIS — D539 Nutritional anemia, unspecified: Secondary | ICD-10-CM

## 2023-03-05 MED ORDER — SODIUM CHLORIDE 0.9 % IV SOLN: Freq: Once | INTRAVENOUS | Status: AC

## 2023-03-05 NOTE — Progress Notes (Signed)
Nutrition follow-up completed with patient and daughter during IV fluids.   Patient status post radiation therapy for esophageal cancer.   Status post PEG placement.  Weight is stable and documented as 136 pounds on March 21.  Patient continues to tolerate 4 cartons of [employer] 1.4 daily with free water flushes.  He reports he is trying to increase p.o. liquids but knows to take this very slowly.  Reports no bowel movement for at least 4 days.  Per daughter, patient is only occasionally taking Dulcolax.  He is not taking this every day.  Patient states energy has improved and he is trying to move around more.  Nutrition diagnosis: Inadequate oral intake, ongoing.    Estimated nutrition needs: 1700-1900 cal, 70-80 g protein, greater than 1.8 L fluid.  4 cartons of [employer] 1.4 provides 1820 cal and 80 g of protein.  Intervention: Continue 4 cartons of [employer] 1.4 daily with 60 mL of free water before and after each bolus feeding. Continue clear liquids by mouth. Provided 2 additional cases of [employer] 1.4 with 2 syringes. Encouraged bowel regimen.  Monitoring, evaluation, goals: Patient will tolerate increased liquids by mouth and 4 cartons of [employer] 1.4 daily.  Next visit: To be scheduled as needed.  **Disclaimer: This note was dictated with voice recognition software. Similar sounding words can inadvertently be transcribed and this note may contain transcription errors which may not have been corrected upon publication of note.**

## 2023-03-11 ENCOUNTER — Other Ambulatory Visit: Payer: Self-pay

## 2023-03-11 ENCOUNTER — Inpatient Hospital Stay: Payer: Medicare Other

## 2023-03-11 ENCOUNTER — Inpatient Hospital Stay (HOSPITAL_BASED_OUTPATIENT_CLINIC_OR_DEPARTMENT_OTHER): Payer: Medicare Other | Admitting: Physician Assistant

## 2023-03-11 VITALS — BP 106/55 | HR 72 | Temp 97.7°F | Resp 20 | Wt 139.5 lb

## 2023-03-11 DIAGNOSIS — R1319 Other dysphagia: Secondary | ICD-10-CM

## 2023-03-11 DIAGNOSIS — C155 Malignant neoplasm of lower third of esophagus: Secondary | ICD-10-CM

## 2023-03-11 DIAGNOSIS — C159 Malignant neoplasm of esophagus, unspecified: Secondary | ICD-10-CM

## 2023-03-11 DIAGNOSIS — Z510 Encounter for antineoplastic radiation therapy: Secondary | ICD-10-CM | POA: Diagnosis not present

## 2023-03-11 DIAGNOSIS — D539 Nutritional anemia, unspecified: Secondary | ICD-10-CM

## 2023-03-11 LAB — CMP (CANCER CENTER ONLY)
ALT: 58 U/L — ABNORMAL HIGH (ref 0–44)
AST: 63 U/L — ABNORMAL HIGH (ref 15–41)
Albumin: 3.3 g/dL — ABNORMAL LOW (ref 3.5–5.0)
Alkaline Phosphatase: 54 U/L (ref 38–126)
Anion gap: 5 (ref 5–15)
BUN: 43 mg/dL — ABNORMAL HIGH (ref 8–23)
CO2: 28 mmol/L (ref 22–32)
Calcium: 8.6 mg/dL — ABNORMAL LOW (ref 8.9–10.3)
Chloride: 106 mmol/L (ref 98–111)
Creatinine: 1.53 mg/dL — ABNORMAL HIGH (ref 0.61–1.24)
GFR, Estimated: 42 mL/min — ABNORMAL LOW (ref >60)
Glucose, Bld: 122 mg/dL — ABNORMAL HIGH (ref 70–99)
Potassium: 5 mmol/L (ref 3.5–5.1)
Sodium: 139 mmol/L (ref 135–145)
Total Bilirubin: 0.3 mg/dL (ref 0.3–1.2)
Total Protein: 5.6 g/dL — ABNORMAL LOW (ref 6.5–8.1)

## 2023-03-11 LAB — CBC WITH DIFFERENTIAL (CANCER CENTER ONLY)
Abs Immature Granulocytes: 0.01 10*3/uL (ref 0.00–0.07)
Basophils Absolute: 0.1 10*3/uL (ref 0.0–0.1)
Basophils Relative: 1 %
Eosinophils Absolute: 0.3 10*3/uL (ref 0.0–0.5)
Eosinophils Relative: 7 %
HCT: 34.3 % — ABNORMAL LOW (ref 39.0–52.0)
Hemoglobin: 11.8 g/dL — ABNORMAL LOW (ref 13.0–17.0)
Immature Granulocytes: 0 %
Lymphocytes Relative: 20 %
Lymphs Abs: 0.8 10*3/uL (ref 0.7–4.0)
MCH: 36.8 pg — ABNORMAL HIGH (ref 26.0–34.0)
MCHC: 34.4 g/dL (ref 30.0–36.0)
MCV: 106.9 fL — ABNORMAL HIGH (ref 80.0–100.0)
Monocytes Absolute: 0.4 10*3/uL (ref 0.1–1.0)
Monocytes Relative: 10 %
Neutro Abs: 2.5 10*3/uL (ref 1.7–7.7)
Neutrophils Relative %: 62 %
Platelet Count: 149 10*3/uL — ABNORMAL LOW (ref 150–400)
RBC: 3.21 MIL/uL — ABNORMAL LOW (ref 4.22–5.81)
RDW: 16.6 % — ABNORMAL HIGH (ref 11.5–15.5)
WBC Count: 4.1 10*3/uL (ref 4.0–10.5)
nRBC: 0 % (ref 0.0–0.2)

## 2023-03-11 LAB — PHOSPHORUS: Phosphorus: 4.2 mg/dL (ref 2.5–4.6)

## 2023-03-11 LAB — MAGNESIUM: Magnesium: 2.2 mg/dL (ref 1.7–2.4)

## 2023-03-11 MED ORDER — SODIUM CHLORIDE 0.9 % IV SOLN: Freq: Once | INTRAVENOUS | Status: AC

## 2023-03-11 NOTE — Progress Notes (Signed)
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 Beta-2 micro: 5.9 Normal immunoglogulin heavy chains Hgb 11.4 Creatinine 2.08 No hypercalcemia   09/09/2018 Imaging   Metastatic skeletal survey negative   09/13/2018 Bone Marrow Biopsy   Morphology (Accession: [id number]): Bone Marrow, Aspirate,Biopsy, and Clot - HYPERCELLULAR BONE MARROW WITH PLASMA CELL NEOPLASM (12% cells, IHC showed lambda light chain restriction) - TRILINEAGE HEMATOPOIESIS. - SEE COMMENT.  Flow cytometry (Accession: [id number]):  - NO MONOCLONAL B-CELL POPULATION IDENTIFIED. - PREDOMINANCE OF T LYMPHOCYTES AND NATURAL KILLER CELLS.   01/05/2023 Pathology Results    FINAL MICROSCOPIC DIAGNOSIS:   A. STOMACH, BIOPSY:  - Gastric mucosa with focally active chronic gastritis, see comment  - Multifocal intestinal metaplasia, negative for dysplasia   B. GE JUNCTION MASS, BIOPSY:  - Invasive moderately differentiated adenocarcinoma    01/07/2023 Imaging    IMPRESSION: 1. Distal esophageal stricture extending over about a 3.9 cm vertical excursion. No findings of metastatic disease to the chest, abdomen, or pelvis. 2. 0.5 cm sub solid nodule in the right middle lobe. Fleischner guidelines do not apply due to  history of malignancy. Surveillance of the context of the patient's follow up oncology imaging is suggested. 3. Nonspecific lobulation of the left mid kidney. This could be incidental or due to CT equivocal end of a dromedary hump but I cannot exclude an underlying left mid kidney mass. This could be surveilled in the context of the patient's follow up oncology imaging, or if clinically warranted, renal MRI with and without contrast could be utilized for further characterization. 4. L1 compression fracture with 90% anterior compression and 6 mm of posterior bony retropulsion, likely late subacute chronicity. 5. Wall thickening in the proximal colon extending to the transverse colon, cannot exclude proximal colitis. 6. Other imaging findings of potential clinical significance: Coronary, aortic arch, and branch vessel atherosclerotic disease. Mitral and aortic valve calcification. Small type 1 hiatal hernia along the lower margin of the mass. Central airway thickening is present, suggesting bronchitis or reactive airways disease. Fatty deposition in the wall of the ascending colon may be incidental but which can have a weak association with inflammatory bowel disease. Lumbar spondylosis and degenerative disc disease contributing to multilevel impingement. 6 mm left kidney lower pole nonobstructive renal calculus.   Esophageal cancer ([place])  01/05/2023 Pathology Results    FINAL MICROSCOPIC DIAGNOSIS:   A. STOMACH, BIOPSY:  - Gastric mucosa with focally active chronic gastritis, see comment  - Multifocal intestinal metaplasia, negative for dysplasia   B. GE JUNCTION MASS, BIOPSY:  - Invasive moderately differentiated adenocarcinoma    01/07/2023 Imaging    IMPRESSION: 1. Distal esophageal stricture extending over about a 3.9 cm vertical excursion. No findings of metastatic disease to the chest, abdomen, or pelvis. 2. 0.5 cm sub solid nodule in the  right middle lobe.  Fleischner guidelines do not apply due to history of malignancy. Surveillance of the context of the patient's follow up oncology imaging is suggested. 3. Nonspecific lobulation of the left mid kidney. This could be incidental or due to CT equivocal end of a dromedary hump but I cannot exclude an underlying left mid kidney mass. This could be surveilled in the context of the patient's follow up oncology imaging, or if clinically warranted, renal MRI with and without contrast could be utilized for further characterization. 4. L1 compression fracture with 90% anterior compression and 6 mm of posterior bony retropulsion, likely late subacute chronicity. 5. Wall thickening in the proximal colon extending to the transverse colon, cannot exclude proximal colitis. 6. Other imaging findings of potential clinical significance: Coronary, aortic arch, and branch vessel atherosclerotic disease. Mitral and aortic valve calcification. Small type 1 hiatal hernia along the lower margin of the mass. Central airway thickening is present, suggesting bronchitis or reactive airways disease. Fatty deposition in the wall of the ascending colon may be incidental but which can have a weak association with inflammatory bowel disease. Lumbar spondylosis and degenerative disc disease contributing to multilevel impingement. 6 mm left kidney lower pole nonobstructive renal calculus.     01/09/2023 Initial Diagnosis   Esophageal cancer ([place])     PRIOR THERAPY: Received palliative radiation from 01/18/23-02/25/2023  CURRENT THERAPY: Supportive care  INTERVAL HISTORY Daniel [name] returns for follow-up as scheduled, last seen by Cira Rue NP in 02/19/2023.  He completed radiation and is slowly recovering. He reports his energy levels are improving. He is able to swallow without discomfort. He is drinking liquids such as broth but has not tried anything with texture. He continues with tube feedings for his nutrition. He  denies nausea, vomiting or abdominal pain. He still has constipation and has a bowel movement with dulculax as needed. He denies easy bruising or signs of active bleeding. He denies fevers, chills, sweats, shortness of breath, chest pain or cough. He has no other complaints.    ROS  All other systems reviewed and negative   Past Medical History:  Diagnosis Date   Blood transfusion    CKD (chronic kidney disease) stage 4, GFR 15-29 ml/min (HCC)    DVT (deep venous thrombosis) (HCC)    from surgery, no longer on Eliquis   Gout    Hearing loss    Hypertension    Hypertriglyceridemia    Hypothyroid    Prediabetes    Prostate cancer [facility])    Pulmonary nodule    Tinnitus      Past Surgical History:  Procedure Laterality Date   BIOPSY  01/05/2023   Procedure: BIOPSY;  Surgeon: Irving Copas., MD;  Location: [facility];  Service: Gastroenterology;;   CATARACT EXTRACTION Bilateral 2020   ESOPHAGOGASTRODUODENOSCOPY (EGD) WITH PROPOFOL N/A 01/05/2023   Procedure: ESOPHAGOGASTRODUODENOSCOPY (EGD) WITH PROPOFOL;  Surgeon: Irving Copas., MD;  Location: [facility];  Service: Gastroenterology;  Laterality: N/A;   FOREIGN BODY REMOVAL  01/05/2023   Procedure: FOREIGN BODY REMOVAL;  Surgeon: Rush Landmark Telford Nab., MD;  Location: [facility];  Service: Gastroenterology;;   HERNIA REPAIR Right 2010   IR GASTROSTOMY TUBE MOD SED  01/20/2023   IR NASO G TUBE PLC W/FL W/RAD  01/20/2023   left inguinal hernia repair  06/29/2008   with mesh sail   PROSTATECTOMY  1998   prostate cancer   right ankle  1980   fracture   right knee  1980  torn meniscus   TOTAL KNEE ARTHROPLASTY Right 01/20/2022   Procedure: TOTAL KNEE ARTHROPLASTY;  Surgeon: Vickey Huger, MD;  Location: [facility];  Service: Orthopedics;  Laterality: Right;     Outpatient Encounter Medications as of 03/11/2023  Medication Sig   allopurinol (ZYLOPRIM) 100 MG tablet Take 100 mg by mouth every Monday, Wednesday, and  Friday.   ALPRAZolam (XANAX) 0.5 MG tablet Take 0.5 mg by mouth 3 (three) times daily as needed for sleep (tremors).   amLODipine (NORVASC) 5 MG tablet Take 5 mg by mouth daily.   apixaban (ELIQUIS) 2.5 MG TABS tablet Take 1 tablet (2.5 mg total) by mouth 2 (two) times daily.   cholecalciferol (VITAMIN D) 1000 units tablet Take 1,000 Units by mouth daily.   levothyroxine (SYNTHROID, LEVOTHROID) 100 MCG tablet Take 100 mcg by mouth daily before breakfast.   metoprolol succinate (TOPROL XL) 25 MG 24 hr tablet Take 0.5 tablets (12.5 mg total) by mouth daily.   omeprazole (PRILOSEC) 40 MG capsule Open 1 capsule and sprinkle in apple sauce twice daily.   oxyCODONE (ROXICODONE) 5 MG/5ML solution Place 5 mLs (5 mg total) into feeding tube every 8 (eight) hours.   sucralfate (CARAFATE) 1 GM/10ML suspension Take 10 mLs (1 g total) by mouth 2 (two) times daily.   No facility-administered encounter medications on file as of 03/11/2023.     Today's Vitals   03/11/23 1340  BP: (!) 106/55  Pulse: 72  Resp: 20  Temp: 97.7 F (36.5 C)  SpO2: 100%  Weight: 139 lb 8 oz (63.3 kg)   Body mass index is 22.86 kg/m.   PHYSICAL EXAM GENERAL:alert, no distress and comfortable SKIN: no rash  EYES: sclera clear LUNGS: clear with normal breathing effort HEART: regular rate & rhythm, no lower extremity edema ABDOMEN: abdomen soft, non-tender and normal bowel sounds. Feeding tube present NEURO: alert & oriented x 3 with fluent speech     CBC    Component Value Date/Time   WBC 4.1 03/11/2023 1321   WBC 6.9 01/20/2023 0833   RBC 3.21 (L) 03/11/2023 1321   HGB 11.8 (L) 03/11/2023 1321   HCT 34.3 (L) 03/11/2023 1321   PLT 149 (L) 03/11/2023 1321   MCV 106.9 (H) 03/11/2023 1321   MCH 36.8 (H) 03/11/2023 1321   MCHC 34.4 03/11/2023 1321   RDW 16.6 (H) 03/11/2023 1321   LYMPHSABS 0.8 03/11/2023 1321   MONOABS 0.4 03/11/2023 1321   EOSABS 0.3 03/11/2023 1321   BASOSABS 0.1 03/11/2023 1321     CMP      Component Value Date/Time   NA 139 02/26/2023 1303   K 5.0 02/26/2023 1303   CL 107 02/26/2023 1303   CO2 24 02/26/2023 1303   GLUCOSE 126 (H) 02/26/2023 1303   BUN 47 (H) 02/26/2023 1303   CREATININE 1.43 (H) 02/26/2023 1303   CALCIUM 8.4 (L) 02/26/2023 1303   PROT 5.6 (L) 02/26/2023 1303   ALBUMIN 3.3 (L) 02/26/2023 1303   AST 103 (H) 02/26/2023 1303   ALT 106 (H) 02/26/2023 1303   ALKPHOS 61 02/26/2023 1303   BILITOT 0.4 02/26/2023 1303   GFRNONAA 45 (L) 02/26/2023 1303   GFRAA 38 (L) 09/22/2018 1311     ASSESSMENT & PLAN: Daniel [name] is a 89 y.o. male with    Esophageal cancer, stage IV, with nodal and bone metastasis; MMR normal, PD-L1 + (CPS 25%), HER 2+ by IHC, positive by FISH -diagnosed in 12/2022 -PET 01/04/23 showed hypermetabolic distal esophageal uptake, and  hypermetabolic mediastinal and bilateral hilar adenopathy, right pubic bone and anterior left sixth ribs, concerning for metastatic disease.  -Received palliative radiation from 01/18/23-02/25/2023 PLAN: -Returns for a follow up for scheduled IV fluids. -Labs from today and require no intervention. WBC 4.1, Hgb 11.8, Plt 149K, creatinine stable at 1.53, LFTs improving. -Proceed with IV fluids today -Reviewed molecular testing results which showed HER2 positive, and PD-L1 with CPS 25%.  -Continue to recover from radiation and provide IV support -RTC in 2 weeks with labs and follow up with Dr. Burr Medico to discuss next steps.    All questions were answered. The patient knows to call the clinic with any problems, questions or concerns. No barriers to learning were detected.   I have spent a total of 25 minutes minutes of face-to-face and non-face-to-face time, preparing to see the patient, performing a medically appropriate examination, counseling and educating the patient, documenting clinical information in the electronic health record, and care coordination.   Dede Query PA-C Dept of Hematology and [place] at [facility] Phone: [phone]

## 2023-03-12 ENCOUNTER — Telehealth: Payer: Self-pay | Admitting: Hematology

## 2023-03-12 NOTE — Telephone Encounter (Signed)
Reached out to patient to schedule per 3/27 LOS, left voicemail.

## 2023-03-16 ENCOUNTER — Inpatient Hospital Stay: Payer: Medicare Other | Attending: Physician Assistant

## 2023-03-16 ENCOUNTER — Ambulatory Visit: Payer: Medicare Other | Admitting: Nutrition

## 2023-03-16 ENCOUNTER — Other Ambulatory Visit: Payer: Self-pay

## 2023-03-16 VITALS — BP 120/58 | HR 77 | Temp 98.5°F | Resp 17 | Wt 138.8 lb

## 2023-03-16 DIAGNOSIS — C7951 Secondary malignant neoplasm of bone: Secondary | ICD-10-CM | POA: Insufficient documentation

## 2023-03-16 DIAGNOSIS — C159 Malignant neoplasm of esophagus, unspecified: Secondary | ICD-10-CM | POA: Diagnosis not present

## 2023-03-16 DIAGNOSIS — R1310 Dysphagia, unspecified: Secondary | ICD-10-CM | POA: Insufficient documentation

## 2023-03-16 DIAGNOSIS — D539 Nutritional anemia, unspecified: Secondary | ICD-10-CM

## 2023-03-16 MED ORDER — SODIUM CHLORIDE 0.9 % IV SOLN: Freq: Once | INTRAVENOUS | Status: AC

## 2023-03-16 NOTE — Progress Notes (Signed)
Brief nutrition follow-up completed with patient and daughter during IV fluids. Patient is status post radiation treatments for esophageal cancer.  He has a PEG for nutrition support.  Weight improved and documented as 138 pounds 12.8 ounces.  This is increased from 136 pounds on March 21.  Daughter reports patient continues to tolerate 4 cartons of [employer] 1.4 daily via PEG with free water flushes.  She reports he is not able to swallow much at all.  He is trying some liquids.  Reports increased energy and is even walking a bit outside.  Daughter says the syringes do not last more than 1 week and she needs continual replacements.  She reports she uses the plunger to push the formula into the feeding tube. No other complaints voiced.    4 cartons of [employer] 1.4 provides 1820 cal and 80 g of protein.  Estimated nutrition needs: 1700-1900 cal, 70-80 g protein, greater than 1.8 L fluid.  Nutrition diagnosis: Inadequate oral intake, ongoing.  Intervention: Provided patient with 4 cases of [employer] 1.4 and several syringes. Educated daughter on using the syringe only to give gravity feedings instead of using the plunger.  I encouraged slow infusions.  Educated her to wash the syringe and warm soapy water, rinse, let dry. Daughter repeats formula administration directions back to this RD.  Monitoring, evaluation, goals: Patient will tolerate increased oral intake safely.  Continue 4 cartons of [employer] 1.4 via PEG daily.  Next visit: To be scheduled as needed.  **Disclaimer: This note was dictated with voice recognition software. Similar sounding words can inadvertently be transcribed and this note may contain transcription errors which may not have been corrected upon publication of note.**

## 2023-03-19 ENCOUNTER — Inpatient Hospital Stay: Payer: Medicare Other

## 2023-03-19 ENCOUNTER — Other Ambulatory Visit: Payer: Self-pay

## 2023-03-19 VITALS — BP 105/57 | HR 68 | Temp 98.6°F | Resp 21

## 2023-03-19 DIAGNOSIS — D539 Nutritional anemia, unspecified: Secondary | ICD-10-CM

## 2023-03-19 DIAGNOSIS — C159 Malignant neoplasm of esophagus, unspecified: Secondary | ICD-10-CM | POA: Diagnosis not present

## 2023-03-19 MED ORDER — SODIUM CHLORIDE 0.9 % IV SOLN: Freq: Once | INTRAVENOUS | Status: AC

## 2023-03-19 NOTE — Patient Instructions (Signed)

## 2023-03-26 NOTE — Assessment & Plan Note (Addendum)
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  -Received palliative radiation from 01/18/23-02/25/2023 -He has been receiving supportive care with IVF

## 2023-03-27 ENCOUNTER — Encounter: Payer: Self-pay | Admitting: Hematology

## 2023-03-27 ENCOUNTER — Inpatient Hospital Stay (HOSPITAL_BASED_OUTPATIENT_CLINIC_OR_DEPARTMENT_OTHER): Payer: Medicare Other | Admitting: Hematology

## 2023-03-27 ENCOUNTER — Inpatient Hospital Stay: Payer: Medicare Other

## 2023-03-27 VITALS — BP 109/61 | HR 80 | Temp 97.8°F | Resp 16 | Wt 138.8 lb

## 2023-03-27 DIAGNOSIS — R1319 Other dysphagia: Secondary | ICD-10-CM

## 2023-03-27 DIAGNOSIS — C159 Malignant neoplasm of esophagus, unspecified: Secondary | ICD-10-CM | POA: Diagnosis not present

## 2023-03-27 DIAGNOSIS — C155 Malignant neoplasm of lower third of esophagus: Secondary | ICD-10-CM | POA: Diagnosis not present

## 2023-03-27 LAB — CBC WITH DIFFERENTIAL (CANCER CENTER ONLY)
Abs Immature Granulocytes: 0.02 10*3/uL (ref 0.00–0.07)
Basophils Absolute: 0 10*3/uL (ref 0.0–0.1)
Basophils Relative: 1 %
Eosinophils Absolute: 0.6 10*3/uL — ABNORMAL HIGH (ref 0.0–0.5)
Eosinophils Relative: 9 %
HCT: 35.5 % — ABNORMAL LOW (ref 39.0–52.0)
Hemoglobin: 12 g/dL — ABNORMAL LOW (ref 13.0–17.0)
Immature Granulocytes: 0 %
Lymphocytes Relative: 25 %
Lymphs Abs: 1.6 10*3/uL (ref 0.7–4.0)
MCH: 36.5 pg — ABNORMAL HIGH (ref 26.0–34.0)
MCHC: 33.8 g/dL (ref 30.0–36.0)
MCV: 107.9 fL — ABNORMAL HIGH (ref 80.0–100.0)
Monocytes Absolute: 0.6 10*3/uL (ref 0.1–1.0)
Monocytes Relative: 9 %
Neutro Abs: 3.7 10*3/uL (ref 1.7–7.7)
Neutrophils Relative %: 56 %
Platelet Count: 151 10*3/uL (ref 150–400)
RBC: 3.29 MIL/uL — ABNORMAL LOW (ref 4.22–5.81)
RDW: 16.1 % — ABNORMAL HIGH (ref 11.5–15.5)
WBC Count: 6.4 10*3/uL (ref 4.0–10.5)
nRBC: 0 % (ref 0.0–0.2)

## 2023-03-27 LAB — CMP (CANCER CENTER ONLY)
ALT: 25 U/L (ref 0–44)
AST: 39 U/L (ref 15–41)
Albumin: 3.3 g/dL — ABNORMAL LOW (ref 3.5–5.0)
Alkaline Phosphatase: 61 U/L (ref 38–126)
Anion gap: 4 — ABNORMAL LOW (ref 5–15)
BUN: 45 mg/dL — ABNORMAL HIGH (ref 8–23)
CO2: 29 mmol/L (ref 22–32)
Calcium: 8.9 mg/dL (ref 8.9–10.3)
Chloride: 107 mmol/L (ref 98–111)
Creatinine: 1.77 mg/dL — ABNORMAL HIGH (ref 0.61–1.24)
GFR, Estimated: 35 mL/min — ABNORMAL LOW (ref >60)
Glucose, Bld: 128 mg/dL — ABNORMAL HIGH (ref 70–99)
Potassium: 5.1 mmol/L (ref 3.5–5.1)
Sodium: 140 mmol/L (ref 135–145)
Total Bilirubin: 0.6 mg/dL (ref 0.3–1.2)
Total Protein: 5.6 g/dL — ABNORMAL LOW (ref 6.5–8.1)

## 2023-03-27 LAB — MAGNESIUM: Magnesium: 2.2 mg/dL (ref 1.7–2.4)

## 2023-03-27 LAB — PHOSPHORUS: Phosphorus: 4 mg/dL (ref 2.5–4.6)

## 2023-03-27 NOTE — Progress Notes (Signed)
[facility]   [phone]) [phone] [phone]   Clinic Follow up Note   Patient Care Team: Loyola Mast, NP as PCP - General (Family Medicine) Thomasene Ripple, DO as PCP - Cardiology (Cardiology) Malachy Mood, MD as Attending Physician (Hematology and Oncology)  Date of Service:  03/27/2023  CHIEF COMPLAINT: f/u of esophageal cancer    CURRENT THERAPY:  Supportive care    ASSESSMENT: Daniel [name] is a 89 y.o. male with   Esophageal cancer (HCC) stage IV, with nodal and bone metastasis; MMR normal, PD-L1 + (CPS 25%), HER 2+ by IHC, positive by FISH -diagnosed in 12/2022 -PET 01/04/23 showed hypermetabolic distal esophageal uptake, and hypermetabolic mediastinal and bilateral hilar adenopathy, right pubic bone and anterior left sixth ribs, concerning for metastatic disease.  -Received palliative radiation from 01/18/23-02/25/2023 -He has been receiving supportive care with IVF  -He is clinically slightly better, is able to tolerate Ensure etc.  He will try soft diet next, patient and his daughters had many questions about his diet, and I answered to their satisfaction. -We again reviewed the option of chemotherapy, HER2 antibody and immunotherapy.  I do not think immunotherapy alone without chemotherapy will be much benefit.  Due to his advanced age, CKD, the goal of quality of her life, I will hold on systemic treatment for now. -I also encouraged him to increase water in the feeding tube, to avoid dehydration.  We can stop IV fluids for now.  He agrees.  PLAN: -He will try soft diet in the next few weeks, then move on soft food if he tolerates well -lab and f/u in 4 weeks   SUMMARY OF ONCOLOGIC HISTORY: Oncology History Overview Note   Cancer Staging  Lambda light chain myeloma (HCC) Staging form: Multiple Myeloma, AJCC 6th Edition - Clinical stage from 09/06/2018: Stage IB - Signed by Arthur Holms, MD on 09/20/2018     Lambda light chain myeloma  09/02/2018  Initial Diagnosis   Lambda light chain myeloma (HCC)   09/06/2018 Miscellaneous   Baseline labs:  Kappa 56, lambda 4012, ratio 0.01 SPEP: M-spike 0.1 g/dl, monoclonal lambda light chain UPEP: M-spike 178 mg/dl, Bence Jones Protein positive; lambda type. Beta-2 micro: 5.9 Normal immunoglogulin heavy chains Hgb 11.4 Creatinine 2.08 No hypercalcemia   09/09/2018 Imaging   Metastatic skeletal survey negative   09/13/2018 Bone Marrow Biopsy   Morphology (Accession: [id number]): Bone Marrow, Aspirate,Biopsy, and Clot - HYPERCELLULAR BONE MARROW WITH PLASMA CELL NEOPLASM (12% cells, IHC showed lambda light chain restriction) - TRILINEAGE HEMATOPOIESIS. - SEE COMMENT.  Flow cytometry (Accession: [id number]):  - NO MONOCLONAL B-CELL POPULATION IDENTIFIED. - PREDOMINANCE OF T LYMPHOCYTES AND NATURAL KILLER CELLS.   01/05/2023 Pathology Results    FINAL MICROSCOPIC DIAGNOSIS:   A. STOMACH, BIOPSY:  - Gastric mucosa with focally active chronic gastritis, see comment  - Multifocal intestinal metaplasia, negative for dysplasia   B. GE JUNCTION MASS, BIOPSY:  - Invasive moderately differentiated adenocarcinoma    01/07/2023 Imaging    IMPRESSION: 1. Distal esophageal stricture extending over about a 3.9 cm vertical excursion. No findings of metastatic disease to the chest, abdomen, or pelvis. 2. 0.5 cm sub solid nodule in the right middle lobe. Fleischner guidelines do not apply due to history of malignancy. Surveillance of the context of the patient's follow up oncology imaging is suggested. 3. Nonspecific lobulation of the left mid kidney. This could be incidental or due to CT equivocal end of a dromedary hump but I cannot exclude an underlying  left mid kidney mass. This could be surveilled in the context of the patient's follow up oncology imaging, or if clinically warranted, renal MRI with and without contrast could be utilized for further characterization. 4. L1 compression  fracture with 90% anterior compression and 6 mm of posterior bony retropulsion, likely late subacute chronicity. 5. Wall thickening in the proximal colon extending to the transverse colon, cannot exclude proximal colitis. 6. Other imaging findings of potential clinical significance: Coronary, aortic arch, and branch vessel atherosclerotic disease. Mitral and aortic valve calcification. Small type 1 hiatal hernia along the lower margin of the mass. Central airway thickening is present, suggesting bronchitis or reactive airways disease. Fatty deposition in the wall of the ascending colon may be incidental but which can have a weak association with inflammatory bowel disease. Lumbar spondylosis and degenerative disc disease contributing to multilevel impingement. 6 mm left kidney lower pole nonobstructive renal calculus.   Esophageal cancer  01/05/2023 Pathology Results    FINAL MICROSCOPIC DIAGNOSIS:   A. STOMACH, BIOPSY:  - Gastric mucosa with focally active chronic gastritis, see comment  - Multifocal intestinal metaplasia, negative for dysplasia   B. GE JUNCTION MASS, BIOPSY:  - Invasive moderately differentiated adenocarcinoma    01/07/2023 Imaging    IMPRESSION: 1. Distal esophageal stricture extending over about a 3.9 cm vertical excursion. No findings of metastatic disease to the chest, abdomen, or pelvis. 2. 0.5 cm sub solid nodule in the right middle lobe. Fleischner guidelines do not apply due to history of malignancy. Surveillance of the context of the patient's follow up oncology imaging is suggested. 3. Nonspecific lobulation of the left mid kidney. This could be incidental or due to CT equivocal end of a dromedary hump but I cannot exclude an underlying left mid kidney mass. This could be surveilled in the context of the patient's follow up oncology imaging, or if clinically warranted, renal MRI with and without contrast could be utilized for further  characterization. 4. L1 compression fracture with 90% anterior compression and 6 mm of posterior bony retropulsion, likely late subacute chronicity. 5. Wall thickening in the proximal colon extending to the transverse colon, cannot exclude proximal colitis. 6. Other imaging findings of potential clinical significance: Coronary, aortic arch, and branch vessel atherosclerotic disease. Mitral and aortic valve calcification. Small type 1 hiatal hernia along the lower margin of the mass. Central airway thickening is present, suggesting bronchitis or reactive airways disease. Fatty deposition in the wall of the ascending colon may be incidental but which can have a weak association with inflammatory bowel disease. Lumbar spondylosis and degenerative disc disease contributing to multilevel impingement. 6 mm left kidney lower pole nonobstructive renal calculus.     01/09/2023 Initial Diagnosis   Esophageal cancer (HCC)      INTERVAL HISTORY: *** Daniel [name] is here for a follow up of  esophageal cancer  He was last seen by Rutha Bouchard on 03/11/2023. He presents to the clinic        All other systems were reviewed with the patient and are negative.  MEDICAL HISTORY:  Past Medical History:  Diagnosis Date   Blood transfusion    CKD (chronic kidney disease) stage 4, GFR 15-29 ml/min    DVT (deep venous thrombosis)    from surgery, no longer on Eliquis   Gout    Hearing loss    Hypertension    Hypertriglyceridemia    Hypothyroid    Prediabetes    Prostate cancer    Pulmonary nodule  Tinnitus     SURGICAL HISTORY: Past Surgical History:  Procedure Laterality Date   BIOPSY  01/05/2023   Procedure: BIOPSY;  Surgeon: Meridee Score Netty Starring., MD;  Location: [facility];  Service: Gastroenterology;;   CATARACT EXTRACTION Bilateral 2020   ESOPHAGOGASTRODUODENOSCOPY (EGD) WITH PROPOFOL N/A 01/05/2023   Procedure: ESOPHAGOGASTRODUODENOSCOPY (EGD) WITH PROPOFOL;  Surgeon:  Lemar Lofty., MD;  Location: [facility];  Service: Gastroenterology;  Laterality: N/A;   FOREIGN BODY REMOVAL  01/05/2023   Procedure: FOREIGN BODY REMOVAL;  Surgeon: Meridee Score Netty Starring., MD;  Location: [facility];  Service: Gastroenterology;;   HERNIA REPAIR Right 2010   IR GASTROSTOMY TUBE MOD SED  01/20/2023   IR NASO G TUBE PLC W/FL W/RAD  01/20/2023   left inguinal hernia repair  06/29/2008   with mesh sail   PROSTATECTOMY  1998   prostate cancer   right ankle  1980   fracture   right knee  1980   torn meniscus   TOTAL KNEE ARTHROPLASTY Right 01/20/2022   Procedure: TOTAL KNEE ARTHROPLASTY;  Surgeon: Dannielle Huh, MD;  Location: [facility];  Service: Orthopedics;  Laterality: Right;    I have reviewed the social history and family history with the patient and they are unchanged from previous note.  ALLERGIES:  has No Known Allergies.  MEDICATIONS:  Current Outpatient Medications  Medication Sig Dispense Refill   allopurinol (ZYLOPRIM) 100 MG tablet Take 100 mg by mouth every Monday, Wednesday, and Friday.     ALPRAZolam (XANAX) 0.5 MG tablet Take 0.5 mg by mouth 3 (three) times daily as needed for sleep (tremors).     amLODipine (NORVASC) 5 MG tablet Take 5 mg by mouth daily.     apixaban (ELIQUIS) 2.5 MG TABS tablet Take 1 tablet (2.5 mg total) by mouth 2 (two) times daily. 180 tablet 3   cholecalciferol (VITAMIN D) 1000 units tablet Take 1,000 Units by mouth daily.     levothyroxine (SYNTHROID, LEVOTHROID) 100 MCG tablet Take 100 mcg by mouth daily before breakfast.     metoprolol succinate (TOPROL XL) 25 MG 24 hr tablet Take 0.5 tablets (12.5 mg total) by mouth daily. 45 tablet 3   omeprazole (PRILOSEC) 40 MG capsule Open 1 capsule and sprinkle in apple sauce twice daily. 60 capsule 2   oxyCODONE (ROXICODONE) 5 MG/5ML solution Place 5 mLs (5 mg total) into feeding tube every 8 (eight) hours. 100 mL 0   sucralfate (CARAFATE) 1 GM/10ML suspension Take 10 mLs (1 g  total) by mouth 2 (two) times daily. 420 mL 1   No current facility-administered medications for this visit.    PHYSICAL EXAMINATION: ECOG PERFORMANCE STATUS: {CHL ONC ECOG ZO:[id number]}  Vitals:   03/27/23 0923  BP: 109/61  Pulse: 80  Resp: 16  Temp: 97.8 F (36.6 C)  SpO2: 100%   Wt Readings from Last 3 Encounters:  03/27/23 138 lb 12.8 oz (63 kg)  03/16/23 138 lb 12.8 oz (63 kg)  03/11/23 139 lb 8 oz (63.3 kg)    {Only keep what was examined. If exam not performed, can use .CEXAM } GENERAL:alert, no distress and comfortable SKIN: skin color, texture, turgor are normal, no rashes or significant lesions EYES: normal, Conjunctiva are pink and non-injected, sclera clear {OROPHARYNX:no exudate, no erythema and lips, buccal mucosa, and tongue normal}  NECK: supple, thyroid normal size, non-tender, without nodularity LYMPH:  no palpable lymphadenopathy in the cervical, axillary {or inguinal} LUNGS: clear to auscultation and percussion with normal breathing effort HEART: regular  rate & rhythm and no murmurs and no lower extremity edema ABDOMEN:abdomen soft, non-tender and normal bowel sounds Musculoskeletal:no cyanosis of digits and no clubbing  NEURO: alert & oriented x 3 with fluent speech, no focal motor/sensory deficits  LABORATORY DATA:  I have reviewed the data as listed    Latest Ref Rng & Units 03/27/2023    8:44 AM 03/11/2023    1:21 PM 02/26/2023    1:03 PM  CBC  WBC 4.0 - 10.5 K/uL 6.4  4.1  5.2   Hemoglobin 13.0 - 17.0 g/dL 71.2  45.8  09.9   Hematocrit 39.0 - 52.0 % 35.5  34.3  31.8   Platelets 150 - 400 K/uL 151  149  190         Latest Ref Rng & Units 03/27/2023    8:44 AM 03/11/2023    1:21 PM 02/26/2023    1:03 PM  CMP  Glucose 70 - 99 mg/dL 833  825  053   BUN 8 - 23 mg/dL 45  43  47   Creatinine 0.61 - 1.24 mg/dL 9.76  7.34  1.93   Sodium 135 - 145 mmol/L 140  139  139   Potassium 3.5 - 5.1 mmol/L 5.1  5.0  5.0   Chloride 98 - 111 mmol/L 107  106   107   CO2 22 - 32 mmol/L 29  28  24    Calcium 8.9 - 10.3 mg/dL 8.9  8.6  8.4   Total Protein 6.5 - 8.1 g/dL 5.6  5.6  5.6   Total Bilirubin 0.3 - 1.2 mg/dL 0.6  0.3  0.4   Alkaline Phos 38 - 126 U/L 61  54  61   AST 15 - 41 U/L 39  63  103   ALT 0 - 44 U/L 25  58  106       RADIOGRAPHIC STUDIES: I have personally reviewed the radiological images as listed and agreed with the findings in the report. No results found.    No orders of the defined types were placed in this encounter.  All questions were answered. The patient knows to call the clinic with any problems, questions or concerns. No barriers to learning was detected. The total time spent in the appointment was {CHL ONC TIME VISIT - [id number]}.     Malachy Mood, MD 03/27/2023   Carolin Coy, CMA, am acting as scribe for Malachy Mood, MD.   {Add scribe attestation statement}

## 2023-03-28 ENCOUNTER — Encounter: Payer: Self-pay | Admitting: Hematology

## 2023-04-09 ENCOUNTER — Encounter: Payer: Self-pay | Admitting: Dietician

## 2023-04-09 NOTE — Progress Notes (Signed)
Nutrition Follow-up:  Brief nutrition follow-up completed with patient daughter Bonita Quin) on 4/24 via telephone.   Return call to [facility] regarding request for additional formula and supplies. Bonita Quin reports patient continues to be reliant on tube. He is tolerating sips of thin liquids (broth, strained soups, watered down applesauce and oatmeal). She was hoping pt would be eating more solid foods at this time. Bonita Quin says patient is running low on [employer].   Intervention: 4 cases [employer] 1.4 + syringes left at registration. Bonita Quin to pick this up on 4/25

## 2023-04-13 ENCOUNTER — Ambulatory Visit
Admission: RE | Admit: 2023-04-13 | Discharge: 2023-04-13 | Disposition: A | Discharge status: Home / Self Care | Payer: Medicare Other | Source: Ambulatory Visit | Attending: Hematology | Admitting: Hematology

## 2023-04-13 DIAGNOSIS — D539 Nutritional anemia, unspecified: Secondary | ICD-10-CM | POA: Insufficient documentation

## 2023-04-13 DIAGNOSIS — Z931 Gastrostomy status: Secondary | ICD-10-CM | POA: Insufficient documentation

## 2023-04-13 DIAGNOSIS — C9000 Multiple myeloma not having achieved remission: Secondary | ICD-10-CM | POA: Insufficient documentation

## 2023-04-13 DIAGNOSIS — N184 Chronic kidney disease, stage 4 (severe): Secondary | ICD-10-CM | POA: Insufficient documentation

## 2023-04-13 DIAGNOSIS — C155 Malignant neoplasm of lower third of esophagus: Secondary | ICD-10-CM | POA: Insufficient documentation

## 2023-04-13 DIAGNOSIS — Z510 Encounter for antineoplastic radiation therapy: Secondary | ICD-10-CM | POA: Insufficient documentation

## 2023-04-13 DIAGNOSIS — Z23 Encounter for immunization: Secondary | ICD-10-CM | POA: Insufficient documentation

## 2023-04-13 DIAGNOSIS — R1319 Other dysphagia: Secondary | ICD-10-CM | POA: Insufficient documentation

## 2023-04-13 DIAGNOSIS — I4891 Unspecified atrial fibrillation: Secondary | ICD-10-CM | POA: Insufficient documentation

## 2023-04-13 DIAGNOSIS — I129 Hypertensive chronic kidney disease with stage 1 through stage 4 chronic kidney disease, or unspecified chronic kidney disease: Secondary | ICD-10-CM | POA: Insufficient documentation

## 2023-04-13 NOTE — Progress Notes (Signed)
  Radiation Oncology         [phone]) [phone] ________________________________  Name: Daniel [name] MRN: [id number]  Date of Service: 04/13/2023  DOB: 1927-04-04  Post Treatment Telephone Note  Diagnosis:   Adenocarcinoma of the distal esophagus.   Intent: Curative  Radiation Treatment Dates: 01/19/2023 through 02/25/2023 Site Technique Total Dose (Gy) Dose per Fx (Gy) Completed Fx Beam Energies  Esophagus: Esoph IMRT 45/45 1.8 25/25 6X  Esophagus: Esoph_Bst IMRT 5.4/5.4 1.8 3/3 6X   (as documented in provider EOT note)   The patient was available for call today.   Symptoms of fatigue have improved since completing therapy.  Symptoms of skin changes have improved since completing therapy.  Symptoms of esophagitis have not improved since completing therapy.   The patient has scheduled follow up with his medical oncologist Dr. Mosetta Putt for ongoing care, and was encouraged to call if he develops concerns or questions regarding radiation.  This concludes the interview.   Ruel Favors, LPN

## 2023-04-23 NOTE — Progress Notes (Signed)
[facility]   [phone] Fax:([phone]   Clinic Follow up Note   Patient Care Team: Loyola Mast, NP as PCP - General (Family Medicine) Thomasene Ripple, DO as PCP - Cardiology (Cardiology) Malachy Mood, MD as Attending Physician (Hematology and Oncology)  Date of Service:  04/24/2023  CHIEF COMPLAINT: f/u of esophageal cancer    CURRENT THERAPY:  Supportive care    ASSESSMENT: Daniel [name] is a 89 y.o. male with   Esophageal cancer (HCC) stage IV, with nodal and bone metastasis; MMR normal, PD-L1 + (CPS 25%), HER 2+ by IHC, positive by FISH -diagnosed in 12/2022 -PET 01/04/23 showed hypermetabolic distal esophageal uptake, and hypermetabolic mediastinal and bilateral hilar adenopathy, right pubic bone and anterior left sixth ribs, concerning for metastatic disease.  -Received palliative radiation from 01/18/23-02/25/2023 -He is clinically slightly better, is able to tolerate Ensure etc.  He will try soft diet next, patient and his daughters had many questions about his diet, and I answered to their satisfaction. -We again reviewed the option of chemotherapy, HER2 antibody and immunotherapy.  I do not think immunotherapy alone without chemotherapy will be much benefit.  Due to his advanced age, CKD, the goal of quality of her life, I will hold on systemic treatment for now. -his tumor showed PD-L1 CPS 25%, I do not think immunotherapy alone is sufficient to control his disease and do not recommend.  -He is clinically doing better, he is eating some solid food now, weight is stable.  Still on tube feeds once a day. -He would like to evaluate his esophagus to see if he has residual tumor or stenosis from radiation, because he wants to eat regular food, and review his feeding tube if possible.  I will reach out to his GI Dr. Meridee Score.  PLAN: -He is clinically doing better, will continue supportive care and observation.  - Referral to set up endoscopy with GI -  reviewed labs - increase water intake - f/u in 2 months with lab    SUMMARY OF ONCOLOGIC HISTORY: Oncology History Overview Note   Cancer Staging  Lambda light chain myeloma (HCC) Staging form: Multiple Myeloma, AJCC 6th Edition - Clinical stage from 09/06/2018: Stage IB - Signed by Arthur Holms, MD on 09/20/2018     Lambda light chain myeloma (HCC)  09/02/2018 Initial Diagnosis   Lambda light chain myeloma (HCC)   09/06/2018 Miscellaneous   Baseline labs:  Kappa 56, lambda 4012, ratio 0.01 SPEP: M-spike 0.1 g/dl, monoclonal lambda light chain UPEP: M-spike 178 mg/dl, Bence Jones Protein positive; lambda type. Beta-2 micro: 5.9 Normal immunoglogulin heavy chains Hgb 11.4 Creatinine 2.08 No hypercalcemia   09/09/2018 Imaging   Metastatic skeletal survey negative   09/13/2018 Bone Marrow Biopsy   Morphology (Accession: [id number]): Bone Marrow, Aspirate,Biopsy, and Clot - HYPERCELLULAR BONE MARROW WITH PLASMA CELL NEOPLASM (12% cells, IHC showed lambda light chain restriction) - TRILINEAGE HEMATOPOIESIS. - SEE COMMENT.  Flow cytometry (Accession: [id number]):  - NO MONOCLONAL B-CELL POPULATION IDENTIFIED. - PREDOMINANCE OF T LYMPHOCYTES AND NATURAL KILLER CELLS.   01/05/2023 Pathology Results    FINAL MICROSCOPIC DIAGNOSIS:   A. STOMACH, BIOPSY:  - Gastric mucosa with focally active chronic gastritis, see comment  - Multifocal intestinal metaplasia, negative for dysplasia   B. GE JUNCTION MASS, BIOPSY:  - Invasive moderately differentiated adenocarcinoma    01/07/2023 Imaging    IMPRESSION: 1. Distal esophageal stricture extending over about a 3.9 cm vertical excursion. No findings of metastatic disease to the chest,  abdomen, or pelvis. 2. 0.5 cm sub solid nodule in the right middle lobe. Fleischner guidelines do not apply due to history of malignancy. Surveillance of the context of the patient's follow up oncology imaging is suggested. 3. Nonspecific lobulation of  the left mid kidney. This could be incidental or due to CT equivocal end of a dromedary hump but I cannot exclude an underlying left mid kidney mass. This could be surveilled in the context of the patient's follow up oncology imaging, or if clinically warranted, renal MRI with and without contrast could be utilized for further characterization. 4. L1 compression fracture with 90% anterior compression and 6 mm of posterior bony retropulsion, likely late subacute chronicity. 5. Wall thickening in the proximal colon extending to the transverse colon, cannot exclude proximal colitis. 6. Other imaging findings of potential clinical significance: Coronary, aortic arch, and branch vessel atherosclerotic disease. Mitral and aortic valve calcification. Small type 1 hiatal hernia along the lower margin of the mass. Central airway thickening is present, suggesting bronchitis or reactive airways disease. Fatty deposition in the wall of the ascending colon may be incidental but which can have a weak association with inflammatory bowel disease. Lumbar spondylosis and degenerative disc disease contributing to multilevel impingement. 6 mm left kidney lower pole nonobstructive renal calculus.   Esophageal cancer (HCC)  01/05/2023 Pathology Results    FINAL MICROSCOPIC DIAGNOSIS:   A. STOMACH, BIOPSY:  - Gastric mucosa with focally active chronic gastritis, see comment  - Multifocal intestinal metaplasia, negative for dysplasia   B. GE JUNCTION MASS, BIOPSY:  - Invasive moderately differentiated adenocarcinoma    01/07/2023 Imaging    IMPRESSION: 1. Distal esophageal stricture extending over about a 3.9 cm vertical excursion. No findings of metastatic disease to the chest, abdomen, or pelvis. 2. 0.5 cm sub solid nodule in the right middle lobe. Fleischner guidelines do not apply due to history of malignancy. Surveillance of the context of the patient's follow up oncology imaging  is suggested. 3. Nonspecific lobulation of the left mid kidney. This could be incidental or due to CT equivocal end of a dromedary hump but I cannot exclude an underlying left mid kidney mass. This could be surveilled in the context of the patient's follow up oncology imaging, or if clinically warranted, renal MRI with and without contrast could be utilized for further characterization. 4. L1 compression fracture with 90% anterior compression and 6 mm of posterior bony retropulsion, likely late subacute chronicity. 5. Wall thickening in the proximal colon extending to the transverse colon, cannot exclude proximal colitis. 6. Other imaging findings of potential clinical significance: Coronary, aortic arch, and branch vessel atherosclerotic disease. Mitral and aortic valve calcification. Small type 1 hiatal hernia along the lower margin of the mass. Central airway thickening is present, suggesting bronchitis or reactive airways disease. Fatty deposition in the wall of the ascending colon may be incidental but which can have a weak association with inflammatory bowel disease. Lumbar spondylosis and degenerative disc disease contributing to multilevel impingement. 6 mm left kidney lower pole nonobstructive renal calculus.     01/09/2023 Initial Diagnosis   Esophageal cancer (HCC)      INTERVAL HISTORY:  Daniel [name] is here for a follow up of  esophageal cancer  He was last seen by me on 03/27/2023. He presents to clinic with his son, and two daughters. His appetite has improved. He has an uncomfortable feeling in chest when eating. He drinks coffee to help with getting the food down. Energy level has  improved. Family is still administering tube feeding at 4:30 each day.    MEDICAL HISTORY:  Past Medical History:  Diagnosis Date   Blood transfusion    CKD (chronic kidney disease) stage 4, GFR 15-29 ml/min (HCC)    DVT (deep venous thrombosis) (HCC)    from surgery, no longer on  Eliquis   Gout    Hearing loss    Hypertension    Hypertriglyceridemia    Hypothyroid    Prediabetes    Prostate cancer (HCC)    Pulmonary nodule    Tinnitus     SURGICAL HISTORY: Past Surgical History:  Procedure Laterality Date   BIOPSY  01/05/2023   Procedure: BIOPSY;  Surgeon: Lemar Lofty., MD;  Location: [facility];  Service: Gastroenterology;;   CATARACT EXTRACTION Bilateral 2020   ESOPHAGOGASTRODUODENOSCOPY (EGD) WITH PROPOFOL N/A 01/05/2023   Procedure: ESOPHAGOGASTRODUODENOSCOPY (EGD) WITH PROPOFOL;  Surgeon: Lemar Lofty., MD;  Location: [facility];  Service: Gastroenterology;  Laterality: N/A;   FOREIGN BODY REMOVAL  01/05/2023   Procedure: FOREIGN BODY REMOVAL;  Surgeon: Meridee Score Netty Starring., MD;  Location: [facility];  Service: Gastroenterology;;   HERNIA REPAIR Right 2010   IR GASTROSTOMY TUBE MOD SED  01/20/2023   IR NASO G TUBE PLC W/FL W/RAD  01/20/2023   left inguinal hernia repair  06/29/2008   with mesh sail   PROSTATECTOMY  1998   prostate cancer   right ankle  1980   fracture   right knee  1980   torn meniscus   TOTAL KNEE ARTHROPLASTY Right 01/20/2022   Procedure: TOTAL KNEE ARTHROPLASTY;  Surgeon: Dannielle Huh, MD;  Location: [facility];  Service: Orthopedics;  Laterality: Right;    I have reviewed the social history and family history with the patient and they are unchanged from previous note.  ALLERGIES:  has No Known Allergies.  MEDICATIONS:  Current Outpatient Medications  Medication Sig Dispense Refill   allopurinol (ZYLOPRIM) 100 MG tablet Take 100 mg by mouth every Monday, Wednesday, and Friday.     ALPRAZolam (XANAX) 0.5 MG tablet Take 0.5 mg by mouth 3 (three) times daily as needed for sleep (tremors).     amLODipine (NORVASC) 5 MG tablet Take 5 mg by mouth daily.     apixaban (ELIQUIS) 2.5 MG TABS tablet Take 1 tablet (2.5 mg total) by mouth 2 (two) times daily. 180 tablet 3   cholecalciferol (VITAMIN D) 1000 units  tablet Take 1,000 Units by mouth daily.     levothyroxine (SYNTHROID, LEVOTHROID) 100 MCG tablet Take 100 mcg by mouth daily before breakfast.     metoprolol succinate (TOPROL XL) 25 MG 24 hr tablet Take 0.5 tablets (12.5 mg total) by mouth daily. 45 tablet 3   omeprazole (PRILOSEC) 40 MG capsule Open 1 capsule and sprinkle in apple sauce twice daily. 60 capsule 2   oxyCODONE (ROXICODONE) 5 MG/5ML solution Place 5 mLs (5 mg total) into feeding tube every 8 (eight) hours. 100 mL 0   sucralfate (CARAFATE) 1 GM/10ML suspension Take 10 mLs (1 g total) by mouth 2 (two) times daily. 420 mL 1   No current facility-administered medications for this visit.    PHYSICAL EXAMINATION: ECOG PERFORMANCE STATUS: 2 - Symptomatic, <50% confined to bed  Vitals:   04/24/23 1024  BP: (!) 118/55  Pulse: 65  Resp: 15  Temp: 98 F (36.7 C)  SpO2: 100%    Wt Readings from Last 3 Encounters:  04/24/23 139 lb 1.6 oz (63.1 kg)  03/27/23 138 lb 12.8 oz (63 kg)  03/16/23 138 lb 12.8 oz (63 kg)     GENERAL:alert, no distress and comfortable SKIN: skin color, texture, turgor are normal, no rashes or significant lesions EYES: normal, Conjunctiva are pink and non-injected, sclera clear NECK: supple, thyroid normal size, non-tender, without nodularity LYMPH:  no palpable lymphadenopathy in the cervical, axillary  LUNGS: clear to auscultation and percussion with normal breathing effort HEART: regular rate & rhythm and no murmurs and no lower extremity edema ABDOMEN:abdomen soft, non-tender and normal bowel sounds Musculoskeletal:no cyanosis of digits and no clubbing  NEURO: alert & oriented x 3 with fluent speech, no focal motor/sensory deficits  LABORATORY DATA:  I have reviewed the data as listed    Latest Ref Rng & Units 04/24/2023    9:39 AM 03/27/2023    8:44 AM 03/11/2023    1:21 PM  CBC  WBC 4.0 - 10.5 K/uL 6.4  6.4  4.1   Hemoglobin 13.0 - 17.0 g/dL 19.5  09.3  26.7   Hematocrit 39.0 - 52.0 % 35.3   35.5  34.3   Platelets 150 - 400 K/uL 188  151  149         Latest Ref Rng & Units 04/24/2023    9:39 AM 03/27/2023    8:44 AM 03/11/2023    1:21 PM  CMP  Glucose 70 - 99 mg/dL 124  580  998   BUN 8 - 23 mg/dL 47  45  43   Creatinine 0.61 - 1.24 mg/dL 3.38  2.50  5.39   Sodium 135 - 145 mmol/L 140  140  139   Potassium 3.5 - 5.1 mmol/L 4.7  5.1  5.0   Chloride 98 - 111 mmol/L 107  107  106   CO2 22 - 32 mmol/L 28  29  28    Calcium 8.9 - 10.3 mg/dL 8.7  8.9  8.6   Total Protein 6.5 - 8.1 g/dL 5.8  5.6  5.6   Total Bilirubin 0.3 - 1.2 mg/dL 0.6  0.6  0.3   Alkaline Phos 38 - 126 U/L 63  61  54   AST 15 - 41 U/L 23  39  63   ALT 0 - 44 U/L 10  25  58       RADIOGRAPHIC STUDIES: I have personally reviewed the radiological images as listed and agreed with the findings in the report. No results found.    No orders of the defined types were placed in this encounter.  All questions were answered. The patient knows to call the clinic with any problems, questions or concerns. No barriers to learning was detected. The total time spent in the appointment was 30 minutes.     Malachy Mood, MD 04/24/2023

## 2023-04-24 ENCOUNTER — Inpatient Hospital Stay: Payer: Medicare Other | Attending: Physician Assistant

## 2023-04-24 ENCOUNTER — Other Ambulatory Visit: Payer: Self-pay

## 2023-04-24 ENCOUNTER — Inpatient Hospital Stay (HOSPITAL_BASED_OUTPATIENT_CLINIC_OR_DEPARTMENT_OTHER): Payer: Medicare Other | Admitting: Hematology

## 2023-04-24 ENCOUNTER — Telehealth: Payer: Self-pay

## 2023-04-24 ENCOUNTER — Encounter: Payer: Self-pay | Admitting: Hematology

## 2023-04-24 VITALS — BP 118/55 | HR 65 | Temp 98.0°F | Resp 15 | Ht 65.5 in | Wt 139.1 lb

## 2023-04-24 DIAGNOSIS — N184 Chronic kidney disease, stage 4 (severe): Secondary | ICD-10-CM | POA: Diagnosis not present

## 2023-04-24 DIAGNOSIS — C9000 Multiple myeloma not having achieved remission: Secondary | ICD-10-CM | POA: Insufficient documentation

## 2023-04-24 DIAGNOSIS — R1319 Other dysphagia: Secondary | ICD-10-CM

## 2023-04-24 DIAGNOSIS — C155 Malignant neoplasm of lower third of esophagus: Secondary | ICD-10-CM | POA: Insufficient documentation

## 2023-04-24 DIAGNOSIS — C159 Malignant neoplasm of esophagus, unspecified: Secondary | ICD-10-CM

## 2023-04-24 LAB — CMP (CANCER CENTER ONLY)
ALT: 10 U/L (ref 0–44)
AST: 23 U/L (ref 15–41)
Albumin: 3.3 g/dL — ABNORMAL LOW (ref 3.5–5.0)
Alkaline Phosphatase: 63 U/L (ref 38–126)
Anion gap: 5 (ref 5–15)
BUN: 47 mg/dL — ABNORMAL HIGH (ref 8–23)
CO2: 28 mmol/L (ref 22–32)
Calcium: 8.7 mg/dL — ABNORMAL LOW (ref 8.9–10.3)
Chloride: 107 mmol/L (ref 98–111)
Creatinine: 2 mg/dL — ABNORMAL HIGH (ref 0.61–1.24)
GFR, Estimated: 30 mL/min — ABNORMAL LOW (ref >60)
Glucose, Bld: 158 mg/dL — ABNORMAL HIGH (ref 70–99)
Potassium: 4.7 mmol/L (ref 3.5–5.1)
Sodium: 140 mmol/L (ref 135–145)
Total Bilirubin: 0.6 mg/dL (ref 0.3–1.2)
Total Protein: 5.8 g/dL — ABNORMAL LOW (ref 6.5–8.1)

## 2023-04-24 LAB — MAGNESIUM: Magnesium: 2.1 mg/dL (ref 1.7–2.4)

## 2023-04-24 LAB — CBC WITH DIFFERENTIAL (CANCER CENTER ONLY)
Abs Immature Granulocytes: 0.02 10*3/uL (ref 0.00–0.07)
Basophils Absolute: 0 10*3/uL (ref 0.0–0.1)
Basophils Relative: 1 %
Eosinophils Absolute: 0.4 10*3/uL (ref 0.0–0.5)
Eosinophils Relative: 6 %
HCT: 35.3 % — ABNORMAL LOW (ref 39.0–52.0)
Hemoglobin: 12.1 g/dL — ABNORMAL LOW (ref 13.0–17.0)
Immature Granulocytes: 0 %
Lymphocytes Relative: 22 %
Lymphs Abs: 1.4 10*3/uL (ref 0.7–4.0)
MCH: 37 pg — ABNORMAL HIGH (ref 26.0–34.0)
MCHC: 34.3 g/dL (ref 30.0–36.0)
MCV: 108 fL — ABNORMAL HIGH (ref 80.0–100.0)
Monocytes Absolute: 0.4 10*3/uL (ref 0.1–1.0)
Monocytes Relative: 7 %
Neutro Abs: 4.1 10*3/uL (ref 1.7–7.7)
Neutrophils Relative %: 64 %
Platelet Count: 188 10*3/uL (ref 150–400)
RBC: 3.27 MIL/uL — ABNORMAL LOW (ref 4.22–5.81)
RDW: 13.6 % (ref 11.5–15.5)
WBC Count: 6.4 10*3/uL (ref 4.0–10.5)
nRBC: 0 % (ref 0.0–0.2)

## 2023-04-24 LAB — CEA (ACCESS): CEA (CHCC): 1.9 ng/mL (ref 0.00–5.00)

## 2023-04-24 LAB — PHOSPHORUS: Phosphorus: 4.3 mg/dL (ref 2.5–4.6)

## 2023-04-24 NOTE — Telephone Encounter (Signed)
-----   Message from Lemar Lofty., MD sent at 04/24/2023  3:49 PM EDT ----- YF, We surely can.  Tor Tsuda, Please move forward with scheduling this patient EGD in the hospital based-setting for follow up esophageal cancer and dysphagia. Can be on 5/29, at [facility] (we have this time open now when we are Hospital MD) or based on what works with his schedule. This is PJ's FIL as you may recall. GM ----- Message ----- From: Malachy Mood, MD Sent: 04/24/2023  10:55 AM EDT To: Lemar Lofty., MD  Gabe,  He finished RT in 2 months ago, he is able to eat most of food but not all now, he wants to have a repeated EGD to see if there is still residual cancer or stricture from RT, could you schedule that?  Thanks  Terrace Arabia

## 2023-04-27 ENCOUNTER — Other Ambulatory Visit: Payer: Self-pay

## 2023-04-27 DIAGNOSIS — C159 Malignant neoplasm of esophagus, unspecified: Secondary | ICD-10-CM

## 2023-04-27 DIAGNOSIS — K2289 Other specified disease of esophagus: Secondary | ICD-10-CM

## 2023-04-27 DIAGNOSIS — R1310 Dysphagia, unspecified: Secondary | ICD-10-CM

## 2023-04-27 DIAGNOSIS — R634 Abnormal weight loss: Secondary | ICD-10-CM

## 2023-04-27 NOTE — Telephone Encounter (Signed)
Left message on machine to call back  

## 2023-04-27 NOTE — Telephone Encounter (Signed)
EGD scheduled, pt instructed and medications reviewed.  Patient instructions mailed to home.  Patient to call with any questions or concerns.  

## 2023-04-27 NOTE — Telephone Encounter (Signed)
EGD has been scheduled for 05/13/23 at [facility] with GM at 8 am.

## 2023-05-12 NOTE — Anesthesia Preprocedure Evaluation (Addendum)
Anesthesia Evaluation  Patient identified by MRN, date of birth, ID band Patient awake    Reviewed: Allergy & Precautions, NPO status , Patient's Chart, lab work & pertinent test results  Airway Mallampati: II  TM Distance: >3 FB Neck ROM: Full    Dental  (+) Missing, Dental Advisory Given   Pulmonary COPD, former smoker   Pulmonary exam normal breath sounds clear to auscultation       Cardiovascular hypertension, Pt. on medications and Pt. on home beta blockers + dysrhythmias Atrial Fibrillation + Valvular Problems/Murmurs (Mild-Mod TR)  Rhythm:Irregular Rate:Normal + Systolic murmurs Echo 01/2023  1. Left ventricular ejection fraction, by estimation, is 60 to 65%. The left ventricle has normal function. The left ventricle has no regional wall motion abnormalities. Left ventricular diastolic parameters were normal.   2. Right ventricular systolic function is normal. The right ventricular size is normal.   3. The mitral valve is abnormal. Trivial mitral valve regurgitation. No evidence of mitral stenosis. Moderate mitral annular calcification.   4. Tricuspid valve regurgitation is mild to moderate.   5. The aortic valve is tricuspid. There is moderate calcification of the aortic valve. There is moderate thickening of the aortic valve. Aortic valve regurgitation is trivial. Aortic valve sclerosis is present, with no evidence of aortic valve stenosis.   6. Aortic dilatation noted. There is mild dilatation of the ascending aorta, measuring 39 mm.   7. The inferior vena cava is normal in size with greater than 50% respiratory variability, suggesting right atrial pressure of 3 mmHg.    Neuro/Psych negative neurological ROS     GI/Hepatic Neg liver ROS,GERD  ,,  Endo/Other  Hypothyroidism    Renal/GU CRFRenal disease     Musculoskeletal   Abdominal   Peds  Hematology  (+) Blood dyscrasia, anemia   Anesthesia Other Findings    Reproductive/Obstetrics                             Anesthesia Physical Anesthesia Plan  ASA: 3  Anesthesia Plan: MAC   Post-op Pain Management: Minimal or no pain anticipated   Induction:   PONV Risk Score and Plan: 1 and Treatment may vary due to age or medical condition, Propofol infusion and TIVA  Airway Management Planned:   Additional Equipment:   Intra-op Plan:   Post-operative Plan:   Informed Consent: I have reviewed the patients History and Physical, chart, labs and discussed the procedure including the risks, benefits and alternatives for the proposed anesthesia with the patient or authorized representative who has indicated his/her understanding and acceptance.     Dental advisory given  Plan Discussed with: CRNA  Anesthesia Plan Comments:        Anesthesia Quick Evaluation

## 2023-05-13 ENCOUNTER — Other Ambulatory Visit: Payer: Self-pay

## 2023-05-13 ENCOUNTER — Ambulatory Visit (HOSPITAL_BASED_OUTPATIENT_CLINIC_OR_DEPARTMENT_OTHER): Payer: Medicare Other | Admitting: Anesthesiology

## 2023-05-13 ENCOUNTER — Ambulatory Visit (HOSPITAL_COMMUNITY)
Admission: RE | Admit: 2023-05-13 | Discharge: 2023-05-13 | Disposition: A | Discharge status: Home / Self Care | Payer: Medicare Other | Attending: Gastroenterology | Admitting: Gastroenterology

## 2023-05-13 ENCOUNTER — Ambulatory Visit (HOSPITAL_COMMUNITY): Payer: Medicare Other | Admitting: Anesthesiology

## 2023-05-13 ENCOUNTER — Encounter (HOSPITAL_COMMUNITY): Payer: Self-pay | Admitting: Gastroenterology

## 2023-05-13 ENCOUNTER — Encounter (HOSPITAL_COMMUNITY)
Admission: RE | Disposition: A | Discharge status: Home / Self Care | Payer: Self-pay | Source: Home / Self Care | Attending: Gastroenterology

## 2023-05-13 DIAGNOSIS — K2211 Ulcer of esophagus with bleeding: Secondary | ICD-10-CM | POA: Diagnosis not present

## 2023-05-13 DIAGNOSIS — K449 Diaphragmatic hernia without obstruction or gangrene: Secondary | ICD-10-CM | POA: Insufficient documentation

## 2023-05-13 DIAGNOSIS — E781 Pure hyperglyceridemia: Secondary | ICD-10-CM | POA: Insufficient documentation

## 2023-05-13 DIAGNOSIS — I4891 Unspecified atrial fibrillation: Secondary | ICD-10-CM | POA: Diagnosis not present

## 2023-05-13 DIAGNOSIS — Z8546 Personal history of malignant neoplasm of prostate: Secondary | ICD-10-CM | POA: Diagnosis not present

## 2023-05-13 DIAGNOSIS — K2289 Other specified disease of esophagus: Secondary | ICD-10-CM | POA: Insufficient documentation

## 2023-05-13 DIAGNOSIS — Z8501 Personal history of malignant neoplasm of esophagus: Secondary | ICD-10-CM | POA: Diagnosis not present

## 2023-05-13 DIAGNOSIS — K31811 Angiodysplasia of stomach and duodenum with bleeding: Secondary | ICD-10-CM

## 2023-05-13 DIAGNOSIS — N184 Chronic kidney disease, stage 4 (severe): Secondary | ICD-10-CM | POA: Diagnosis not present

## 2023-05-13 DIAGNOSIS — Z923 Personal history of irradiation: Secondary | ICD-10-CM | POA: Insufficient documentation

## 2023-05-13 DIAGNOSIS — R1310 Dysphagia, unspecified: Secondary | ICD-10-CM | POA: Insufficient documentation

## 2023-05-13 DIAGNOSIS — C158 Malignant neoplasm of overlapping sites of esophagus: Secondary | ICD-10-CM | POA: Diagnosis not present

## 2023-05-13 DIAGNOSIS — E039 Hypothyroidism, unspecified: Secondary | ICD-10-CM | POA: Insufficient documentation

## 2023-05-13 DIAGNOSIS — C160 Malignant neoplasm of cardia: Secondary | ICD-10-CM | POA: Insufficient documentation

## 2023-05-13 DIAGNOSIS — I129 Hypertensive chronic kidney disease with stage 1 through stage 4 chronic kidney disease, or unspecified chronic kidney disease: Secondary | ICD-10-CM

## 2023-05-13 DIAGNOSIS — Z79899 Other long term (current) drug therapy: Secondary | ICD-10-CM | POA: Insufficient documentation

## 2023-05-13 DIAGNOSIS — J449 Chronic obstructive pulmonary disease, unspecified: Secondary | ICD-10-CM | POA: Diagnosis not present

## 2023-05-13 DIAGNOSIS — C159 Malignant neoplasm of esophagus, unspecified: Secondary | ICD-10-CM

## 2023-05-13 DIAGNOSIS — Z87891 Personal history of nicotine dependence: Secondary | ICD-10-CM | POA: Diagnosis not present

## 2023-05-13 DIAGNOSIS — R634 Abnormal weight loss: Secondary | ICD-10-CM

## 2023-05-13 DIAGNOSIS — Z86718 Personal history of other venous thrombosis and embolism: Secondary | ICD-10-CM | POA: Insufficient documentation

## 2023-05-13 DIAGNOSIS — K219 Gastro-esophageal reflux disease without esophagitis: Secondary | ICD-10-CM | POA: Insufficient documentation

## 2023-05-13 DIAGNOSIS — Z931 Gastrostomy status: Secondary | ICD-10-CM | POA: Diagnosis not present

## 2023-05-13 DIAGNOSIS — I083 Combined rheumatic disorders of mitral, aortic and tricuspid valves: Secondary | ICD-10-CM | POA: Diagnosis not present

## 2023-05-13 DIAGNOSIS — Z7901 Long term (current) use of anticoagulants: Secondary | ICD-10-CM | POA: Insufficient documentation

## 2023-05-13 DIAGNOSIS — Z9221 Personal history of antineoplastic chemotherapy: Secondary | ICD-10-CM | POA: Insufficient documentation

## 2023-05-13 DIAGNOSIS — K31819 Angiodysplasia of stomach and duodenum without bleeding: Secondary | ICD-10-CM | POA: Diagnosis not present

## 2023-05-13 HISTORY — PX: BIOPSY: SHX5522

## 2023-05-13 HISTORY — PX: ESOPHAGOGASTRODUODENOSCOPY (EGD) WITH PROPOFOL: SHX5813

## 2023-05-13 HISTORY — PX: HOT HEMOSTASIS: SHX5433

## 2023-05-13 SURGERY — ESOPHAGOGASTRODUODENOSCOPY (EGD) WITH PROPOFOL
Anesthesia: Monitor Anesthesia Care

## 2023-05-13 MED ORDER — LIDOCAINE 2% (20 MG/ML) 5 ML SYRINGE
INTRAMUSCULAR | Status: DC | PRN
  Administered 2023-05-13: 20 mg via INTRAVENOUS

## 2023-05-13 MED ORDER — APIXABAN 2.5 MG PO TABS: 2.5000 mg | ORAL_TABLET | Freq: Two times a day (BID) | ORAL | 3 refills | Status: DC

## 2023-05-13 MED ORDER — SUCRALFATE 1 GM/10ML PO SUSP: 1.0000 g | Freq: Four times a day (QID) | ORAL | 12 refills | Status: AC

## 2023-05-13 MED ORDER — SODIUM CHLORIDE 0.9 % IV SOLN: INTRAVENOUS | Status: DC

## 2023-05-13 MED ORDER — PROPOFOL 10 MG/ML IV BOLUS
INTRAVENOUS | Status: DC | PRN
  Administered 2023-05-13: 50 mg via INTRAVENOUS
  Administered 2023-05-13: 20 mg via INTRAVENOUS
  Administered 2023-05-13: 30 mg via INTRAVENOUS
  Administered 2023-05-13: 10 mg via INTRAVENOUS
  Administered 2023-05-13: 20 mg via INTRAVENOUS

## 2023-05-13 SURGICAL SUPPLY — 15 items

## 2023-05-13 NOTE — Discharge Instructions (Signed)
YOU HAD AN ENDOSCOPIC PROCEDURE TODAY: Refer to the procedure report and other information in the discharge instructions given to you for any specific questions about what was found during the examination. If this information does not answer your questions, please call [place] office at [phone] to clarify.   YOU SHOULD EXPECT: Some feelings of bloating in the abdomen. Passage of more gas than usual. Walking can help get rid of the air that was put into your GI tract during the procedure and reduce the bloating. If you had a lower endoscopy (such as a colonoscopy or flexible sigmoidoscopy) you may notice spotting of blood in your stool or on the toilet paper. Some abdominal soreness may be present for a day or two, also.  DIET: Your first meal following the procedure should be a light meal and then it is ok to progress to your normal diet. A half-sandwich or bowl of soup is an example of a good first meal. Heavy or fried foods are harder to digest and may make you feel nauseous or bloated. Drink plenty of fluids but you should avoid alcoholic beverages for 24 hours. If you had a esophageal dilation, please see attached instructions for diet.    ACTIVITY: Your care partner should take you home directly after the procedure. You should plan to take it easy, moving slowly for the rest of the day. You can resume normal activity the day after the procedure however YOU SHOULD NOT DRIVE, use power tools, machinery or perform tasks that involve climbing or major physical exertion for 24 hours (because of the sedation medicines used during the test).   SYMPTOMS TO REPORT IMMEDIATELY: A gastroenterologist can be reached at any hour. Please call [phone]  for any of the following symptoms:   Following upper endoscopy (EGD, EUS, ERCP, esophageal dilation) Vomiting of blood or coffee ground material  New, significant abdominal pain  New, significant chest pain or pain under the shoulder blades  Painful or  persistently difficult swallowing  New shortness of breath  Black, tarry-looking or red, bloody stools  FOLLOW UP:  If any biopsies were taken you will be contacted by phone or by letter within the next 1-3 weeks. Call [phone]  if you have not heard about the biopsies in 3 weeks.  Please also call with any specific questions about appointments or follow up tests.  

## 2023-05-13 NOTE — Op Note (Addendum)
[facility] Patient Name: Daniel [name] Procedure Date : 05/13/2023 MRN: [id number] Attending MD: Corliss Parish , MD, [id number] Date of Birth: 08-26-1927 CSN: [id number] Age: 89 Admit Type: Outpatient Procedure:                Upper GI endoscopy Indications:              Dysphagia, Personal history of malignant esophageal                            neoplasm Providers:                Corliss Parish, MD, Melany Guernsey,                            Technician, Fransisca Connors Referring MD:             Malachy Mood, Radene Gunning, Loyola Mast Medicines:                Monitored Anesthesia Care Complications:            No immediate complications. Estimated Blood Loss:     Estimated blood loss was minimal. Procedure:                Pre-Anesthesia Assessment:                           - Prior to the procedure, a History and Physical                            was performed, and patient medications and                            allergies were reviewed. The patient's tolerance of                            previous anesthesia was also reviewed. The risks                            and benefits of the procedure and the sedation                            options and risks were discussed with the patient.                            All questions were answered, and informed consent                            was obtained. Prior Anticoagulants: The patient has                            taken Eliquis (apixaban), last dose was 2 days                            prior to procedure. ASA Grade Assessment: III - A  patient with severe systemic disease. After                            reviewing the risks and benefits, the patient was                            deemed in satisfactory condition to undergo the                            procedure.                           After obtaining informed consent, the endoscope was                             passed under direct vision. Throughout the                            procedure, the patient's blood pressure, pulse, and                            oxygen saturations were monitored continuously. The                            GIF-H190 ([id number]) Olympus endoscope was introduced                            through the mouth, and advanced to the second part                            of duodenum. The upper GI endoscopy was                            accomplished without difficulty. The patient                            tolerated the procedure. Scope In: Scope Out: Findings:      No gross lesions were noted in the proximal esophagus and in the mid       esophagus.      Diffuse severe mucosal changes characterized by congestion, erythema,       friability (with contact bleeding), granularity, inflammation,       scalloping, altered texture and ulceration were found in the distal       esophagus extending to the gastroesophageal junction and to the cardia.       This is significantly different than the previously diagnosed       esophageal/GE junction cancer, and has appearance that is more       suggestive of radiation esophagitis, but to be sure we are not having       any evidence of persisting disease, biopsies were taken with a cold       forceps for histology.      A small hiatal hernia was present.      There was evidence of an intact gastrostomy with a patent G-tube present       on the anterior wall  of the stomach. This was characterized by healthy       appearing mucosa. The bumper was adjusted to 4 cm (was at 6 cm).      A single small angiodysplastic lesion with no bleeding was found in the       gastric antrum. Fulguration to ablate the lesion to prevent bleeding by       argon plasma was successful due to patient's anemia.      No other gross lesions were noted in the entire examined stomach.      No gross lesions were noted in the duodenal bulb, in the first portion       of  the duodenum and in the second portion of the duodenum. Impression:               - No gross lesions in the proximal esophagus and in                            the mid esophagus.                           - Congested, erythematous, friable (with contact                            bleeding), granular, inflamed, scalloped, texture                            changed, ulcerated mucosa in the esophagus. This is                            in the area of the previous esophageal/GE junction                            mass. It is not clear that this is persistent                            disease and if whether this is just radiation                            changes, but to ensure this area was extensively                            biopsied.                           - Small hiatal hernia.                           - Patent gastrostomy tube on anterior wall. I did                            adjust the bumper to 4 cm as it was very loose and                            nearly at 6 cm.                           -  A single non-bleeding angiodysplastic lesion in                            the stomach. Treated with argon plasma coagulation                            (APC).                           - No other gross lesions in the entire stomach.                           - No gross lesions in the duodenal bulb, in the                            first portion of the duodenum and in the second                            portion of the duodenum. Recommendation:           - The patient will be observed post-procedure,                            until all discharge criteria are met.                           - Discharge patient to home.                           - Patient has a contact number available for                            emergencies. The signs and symptoms of potential                            delayed complications were discussed with the                            patient. Return to  normal activities tomorrow.                            Written discharge instructions were provided to the                            patient.                           - Resume previous diet. Chew thoroughly and cut                            small pieces.                           - Continue PPI twice daily.                           -  Increase Carafate to 4 times daily. Continue this                            for 2 months and then decrease to twice daily.                           - Eliquis restart on 5/31.                           - Await pathology results.                           - Observe patient's clinical course.                           - Repeat upper endoscopy in 3 to 6 months to check                            healing and for surveillance (pending negative                            biopsies). Can also consider earlier follow-up if                            dysphagia symptoms become more significant for                            potential dilation.                           - The findings and recommendations were discussed                            with the patient.                           - The findings and recommendations were discussed                            with the patient's family. Procedure Code(s):        --- Professional ---                           43255, 59, Esophagogastroduodenoscopy, flexible,                            transoral; with control of bleeding, any method                           43239, Esophagogastroduodenoscopy, flexible,                            transoral; with biopsy, single or multiple Diagnosis Code(s):        --- Professional ---  K20.91, Esophagitis, unspecified with bleeding                           K22.89, Other specified disease of esophagus                           K22.11, Ulcer of esophagus with bleeding                           K44.9, Diaphragmatic hernia without obstruction or                             gangrene                           K31.819, Angiodysplasia of stomach and duodenum                            without bleeding                           R13.10, Dysphagia, unspecified                           Z85.01, Personal history of malignant neoplasm of                            esophagus CPT copyright 2022 [employer]. All rights reserved. The codes documented in this report are preliminary and upon coder review may  be revised to meet current compliance requirements. Corliss Parish, MD 05/13/2023 8:38:39 AM Number of Addenda: 0

## 2023-05-13 NOTE — H&P (Signed)
GASTROENTEROLOGY PROCEDURE H&P NOTE   Primary Care Physician: Loyola Mast, NP  HPI: Daniel [name] is a 89 y.o. 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  Past Medical History:  Diagnosis Date   Blood transfusion    CKD (chronic kidney disease) stage 4, GFR 15-29 ml/min (HCC)    DVT (deep venous thrombosis) (HCC)    from surgery, no longer on Eliquis   Gout    Hearing loss    Hypertension    Hypertriglyceridemia    Hypothyroid    Prediabetes    Prostate cancer [facility])    Pulmonary nodule    Tinnitus    Past Surgical History:  Procedure Laterality Date   BIOPSY  01/05/2023   Procedure: BIOPSY;  Surgeon: Lemar Lofty., MD;  Location: [facility];  Service: Gastroenterology;;   CATARACT EXTRACTION Bilateral 2020   ESOPHAGOGASTRODUODENOSCOPY (EGD) WITH PROPOFOL N/A 01/05/2023   Procedure: ESOPHAGOGASTRODUODENOSCOPY (EGD) WITH PROPOFOL;  Surgeon: Lemar Lofty., MD;  Location: [facility];  Service: Gastroenterology;  Laterality: N/A;   FOREIGN BODY REMOVAL  01/05/2023   Procedure: FOREIGN BODY REMOVAL;  Surgeon: Meridee Score Netty Starring., MD;  Location: [facility];  Service: Gastroenterology;;   HERNIA REPAIR Right 2010   IR GASTROSTOMY TUBE MOD SED  01/20/2023   IR NASO G TUBE PLC W/FL W/RAD  01/20/2023   left inguinal hernia repair  06/29/2008   with mesh sail   PROSTATECTOMY  1998   prostate cancer   right ankle  1980   fracture   right knee  1980   torn meniscus   TOTAL KNEE ARTHROPLASTY Right 01/20/2022   Procedure: TOTAL KNEE ARTHROPLASTY;  Surgeon: Dannielle Huh, MD;  Location: [facility];  Service: Orthopedics;  Laterality: Right;   Current Facility-Administered Medications  Medication Dose Route Frequency Provider Last Rate Last Admin   0.9 %  sodium chloride infusion   Intravenous Continuous Mansouraty, Netty Starring., MD 20 mL/hr at 05/13/23 0720 New Bag at 05/13/23 0720     Current Facility-Administered Medications:    0.9 %  sodium chloride infusion, , Intravenous, Continuous, Mansouraty, Netty Starring., MD, Last Rate: 20 mL/hr at 05/13/23 0720, New Bag at 05/13/23 0720 No Known Allergies Family History  Problem Relation Age of Onset   Heart disease Mother    Heart disease Father    Cancer Sister        breast cancer   Heart disease Brother    Prostate cancer Brother    Colon cancer Neg Hx    Esophageal cancer Neg Hx    Inflammatory bowel disease Neg Hx    Liver disease Neg Hx    Pancreatic cancer Neg Hx    Rectal cancer Neg Hx    Stomach cancer Neg Hx    Social History   Socioeconomic History   Marital status: Widowed    Spouse name: Not on file   Number of children: 5   Years of education: Not on file   Highest education level: Not on file  Occupational History   Occupation: retired Research officer, political party  Tobacco Use   Smoking status: Former    Packs/day: 1.00    Years: 20.00    Additional pack years: 0.00    Total pack years: 20.00    Types: Cigarettes   Smokeless tobacco: Not on file  Vaping Use   Vaping Use: Never used  Substance and Sexual Activity   Alcohol use: Yes    Comment:  used to drink moderately,   Drug use: No   Sexual activity: Not on file  Other Topics Concern   Not on file  Social History Narrative   Not on file   Social Determinants of Health   Financial Resource Strain: Not on file  Food Insecurity: Not on file  Transportation Needs: Not on file  Physical Activity: Not on file  Stress: Not on file  Social Connections: Not on file  Intimate Partner Violence: Not on file    Physical Exam: Today's Vitals   05/13/23 0718  BP: (!) 138/98  Pulse: 92  Resp: 18  Temp: 97.9 F (36.6 C)  TempSrc: Temporal  SpO2: 99%  Weight: 61.7 kg  Height: 5' 5.5" (1.664 m)  PainSc: 0-No pain   Body mass index is 22.29 kg/m. GEN: NAD EYE: Sclerae anicteric ENT: MMM CV: Non-tachycardic GI: Soft, NT/ND NEURO:  Alert  & Oriented x 3  Lab Results: No results for input(s): "WBC", "HGB", "HCT", "PLT" in the last 72 hours. BMET No results for input(s): "NA", "K", "CL", "CO2", "GLUCOSE", "BUN", "CREATININE", "CALCIUM" in the last 72 hours. LFT No results for input(s): "PROT", "ALBUMIN", "AST", "ALT", "ALKPHOS", "BILITOT", "BILIDIR", "IBILI" in the last 72 hours. PT/INR No results for input(s): "LABPROT", "INR" in the last 72 hours.   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  The risks and benefits of endoscopic evaluation/treatment were discussed with the patient and/or family; these include but are not limited to the risk of perforation, infection, bleeding, missed lesions, lack of diagnosis, severe illness requiring hospitalization, as well as anesthesia and sedation related illnesses.  The patient's history has been reviewed, patient examined, no change in status, and deemed stable for procedure.  The patient and/or family is agreeable to proceed.    Daniel Parish, MD [facility] Office # [phone]

## 2023-05-13 NOTE — Anesthesia Postprocedure Evaluation (Signed)
Anesthesia Post Note  Patient: Daniel [name]  Procedure(s) Performed: ESOPHAGOGASTRODUODENOSCOPY (EGD) WITH PROPOFOL BIOPSY HOT HEMOSTASIS (ARGON PLASMA COAGULATION/BICAP)     Patient location during evaluation: Endoscopy Anesthesia Type: MAC Level of consciousness: awake and alert Pain management: pain level controlled Vital Signs Assessment: post-procedure vital signs reviewed and stable Respiratory status: spontaneous breathing Cardiovascular status: stable Anesthetic complications: no   No notable events documented.  Last Vitals:  Vitals:   05/13/23 0845 05/13/23 0850  BP: 125/75 125/75  Pulse:  75  Resp:  15  Temp:    SpO2:  91%    Last Pain:  Vitals:   05/13/23 0845  TempSrc:   PainSc: 0-No pain                 Lewie Loron

## 2023-05-13 NOTE — Transfer of Care (Signed)
Immediate Anesthesia Transfer of Care Note  Patient: Daniel [name]  Procedure(s) Performed: ESOPHAGOGASTRODUODENOSCOPY (EGD) WITH PROPOFOL BIOPSY HOT HEMOSTASIS (ARGON PLASMA COAGULATION/BICAP)  Patient Location: Endoscopy Unit  Anesthesia Type:MAC  Level of Consciousness: drowsy, patient cooperative, and responds to stimulation  Airway & Oxygen Therapy: Patient Spontanous Breathing and Patient connected to nasal cannula oxygen  Post-op Assessment: Report given to RN and Post -op Vital signs reviewed and stable  Post vital signs: Reviewed and stable  Last Vitals:  Vitals Value Taken Time  BP    Temp    Pulse 79 05/13/23 0823  Resp 18 05/13/23 0823  SpO2 97 % 05/13/23 0823  Vitals shown include unvalidated device data.  Last Pain:  Vitals:   05/13/23 0718  TempSrc: Temporal  PainSc: 0-No pain         Complications: No notable events documented.

## 2023-05-14 LAB — SURGICAL PATHOLOGY

## 2023-05-15 ENCOUNTER — Encounter: Payer: Self-pay | Admitting: Gastroenterology

## 2023-05-17 ENCOUNTER — Encounter (HOSPITAL_COMMUNITY): Payer: Self-pay | Admitting: Gastroenterology

## 2023-05-22 ENCOUNTER — Telehealth: Payer: Self-pay | Admitting: Dietician

## 2023-05-22 NOTE — Telephone Encounter (Signed)
Called placed to pt daughter Bonita Quin) for nutrition follow-up. Left VM with request for return call. Contact information provided.

## 2023-06-25 ENCOUNTER — Inpatient Hospital Stay: Payer: Medicare Other

## 2023-06-25 ENCOUNTER — Other Ambulatory Visit: Payer: Self-pay

## 2023-06-25 ENCOUNTER — Encounter: Payer: Self-pay | Admitting: Hematology

## 2023-06-25 ENCOUNTER — Inpatient Hospital Stay: Payer: Medicare Other | Attending: Physician Assistant | Admitting: Hematology

## 2023-06-25 VITALS — BP 131/66 | HR 74 | Temp 97.6°F | Resp 18 | Ht 65.5 in | Wt 143.9 lb

## 2023-06-25 DIAGNOSIS — C9000 Multiple myeloma not having achieved remission: Secondary | ICD-10-CM | POA: Diagnosis not present

## 2023-06-25 DIAGNOSIS — C155 Malignant neoplasm of lower third of esophagus: Secondary | ICD-10-CM | POA: Diagnosis present

## 2023-06-25 DIAGNOSIS — R1319 Other dysphagia: Secondary | ICD-10-CM

## 2023-06-25 DIAGNOSIS — N184 Chronic kidney disease, stage 4 (severe): Secondary | ICD-10-CM | POA: Insufficient documentation

## 2023-06-25 DIAGNOSIS — C159 Malignant neoplasm of esophagus, unspecified: Secondary | ICD-10-CM

## 2023-06-25 LAB — CMP (CANCER CENTER ONLY)
ALT: 11 U/L (ref 0–44)
AST: 22 U/L (ref 15–41)
Albumin: 3.4 g/dL — ABNORMAL LOW (ref 3.5–5.0)
Alkaline Phosphatase: 65 U/L (ref 38–126)
Anion gap: 7 (ref 5–15)
BUN: 37 mg/dL — ABNORMAL HIGH (ref 8–23)
CO2: 26 mmol/L (ref 22–32)
Calcium: 9 mg/dL (ref 8.9–10.3)
Chloride: 107 mmol/L (ref 98–111)
Creatinine: 2.2 mg/dL — ABNORMAL HIGH (ref 0.61–1.24)
GFR, Estimated: 27 mL/min — ABNORMAL LOW (ref >60)
Glucose, Bld: 128 mg/dL — ABNORMAL HIGH (ref 70–99)
Potassium: 4.2 mmol/L (ref 3.5–5.1)
Sodium: 140 mmol/L (ref 135–145)
Total Bilirubin: 0.7 mg/dL (ref 0.3–1.2)
Total Protein: 6.1 g/dL — ABNORMAL LOW (ref 6.5–8.1)

## 2023-06-25 LAB — CBC WITH DIFFERENTIAL (CANCER CENTER ONLY)
Abs Immature Granulocytes: 0.02 10*3/uL (ref 0.00–0.07)
Basophils Absolute: 0.1 10*3/uL (ref 0.0–0.1)
Basophils Relative: 1 %
Eosinophils Absolute: 0.4 10*3/uL (ref 0.0–0.5)
Eosinophils Relative: 5 %
HCT: 37.3 % — ABNORMAL LOW (ref 39.0–52.0)
Hemoglobin: 12.8 g/dL — ABNORMAL LOW (ref 13.0–17.0)
Immature Granulocytes: 0 %
Lymphocytes Relative: 18 %
Lymphs Abs: 1.4 10*3/uL (ref 0.7–4.0)
MCH: 35.7 pg — ABNORMAL HIGH (ref 26.0–34.0)
MCHC: 34.3 g/dL (ref 30.0–36.0)
MCV: 103.9 fL — ABNORMAL HIGH (ref 80.0–100.0)
Monocytes Absolute: 0.4 10*3/uL (ref 0.1–1.0)
Monocytes Relative: 6 %
Neutro Abs: 5.4 10*3/uL (ref 1.7–7.7)
Neutrophils Relative %: 70 %
Platelet Count: 214 10*3/uL (ref 150–400)
RBC: 3.59 MIL/uL — ABNORMAL LOW (ref 4.22–5.81)
RDW: 13.2 % (ref 11.5–15.5)
WBC Count: 7.7 10*3/uL (ref 4.0–10.5)
nRBC: 0 % (ref 0.0–0.2)

## 2023-06-25 LAB — CEA (ACCESS): CEA (CHCC): 2.34 ng/mL (ref 0.00–5.00)

## 2023-06-25 LAB — PHOSPHORUS: Phosphorus: 3.7 mg/dL (ref 2.5–4.6)

## 2023-06-25 LAB — MAGNESIUM: Magnesium: 2 mg/dL (ref 1.7–2.4)

## 2023-06-25 NOTE — Progress Notes (Signed)
[facility]   Telephone:([phone]) [phone] Fax:([phone]) [phone]   Clinic Follow up Note   Patient Care Team: Loyola Mast, NP as PCP - General (Family Medicine) Thomasene Ripple, DO as PCP - Cardiology (Cardiology) Malachy Mood, MD as Attending Physician (Hematology and Oncology)  Date of Service:  06/25/2023  CHIEF COMPLAINT: f/u of esophageal cancer   CURRENT THERAPY:   Supportive care   ASSESSMENT:  [name] is a 89 y.o. male with   Esophageal cancer (HCC) stage IV, with nodal and bone metastasis; MMR normal, PD-L1 + (CPS 25%), HER 2+ by IHC, positive by FISH -diagnosed in 12/2022 -PET 01/04/23 showed hypermetabolic distal esophageal uptake, and hypermetabolic mediastinal and bilateral hilar adenopathy, right pubic bone and anterior left sixth ribs, concerning for metastatic disease.  -Received palliative radiation from 01/18/23-02/25/2023 -He is clinically slightly better, is able to tolerate Ensure etc.  He will try soft diet next, patient and his daughters had many questions about his diet, and I answered to their satisfaction. -We again reviewed the option of chemotherapy, HER2 antibody and immunotherapy.  I do not think immunotherapy alone without chemotherapy will be much benefit.  Due to his advanced age, CKD, the goal of quality of her life, I will hold on systemic treatment for now. -his tumor showed PD-L1 CPS 25%, I do not think immunotherapy alone is sufficient to control his disease and do not recommend.  -He is clinically doing better, he is eating some solid food now, weight is stable.  Still on tube feeds once a day. -He would like to evaluate his esophagus to see if he has residual tumor or stenosis from radiation, because he wants to eat regular food, and review his feeding tube if possible.  He had EGD on 05/13/2023 which showed extensive inflammation at the previous cancer site, biopsy showed residual invasive adenocarcinoma.  He overall had good response to  radiation, but still has residual disease after.  He understands the cancer will eventually progress down the road. -He has been eating most regular food in the past few months, much better in the past 3 weeks, he has not been using the feeding tube for the past 3 weeks.  He would like to remove it.  For the quality of his life, I will refer him back to IR for PEG tube removal.  He understands he may still need it down the road and he is OK to have it put back if needed.  -Continue supportive care and follow-up.   PLAN: -reviewed EGD and Biopsy results w/pt -request the feeding tube to be removed by IR -Continue Supportive Care -lab and f/u in 3 months   SUMMARY OF ONCOLOGIC HISTORY: Oncology History Overview Note   Cancer Staging  Lambda light chain myeloma (HCC) Staging form: Multiple Myeloma, AJCC 6th Edition - Clinical stage from 09/06/2018: Stage IB - Signed by Arthur Holms, MD on 09/20/2018     Lambda light chain myeloma (HCC)  09/02/2018 Initial Diagnosis   Lambda light chain myeloma (HCC)   09/06/2018 Miscellaneous   Baseline labs:  Kappa 56, lambda 4012, ratio 0.01 SPEP: M-spike 0.1 g/dl, monoclonal lambda light chain UPEP: M-spike 178 mg/dl, Bence Jones Protein positive; lambda type. Beta-2 micro: 5.9 Normal immunoglogulin heavy chains Hgb 11.4 Creatinine 2.08 No hypercalcemia   09/09/2018 Imaging   Metastatic skeletal survey negative   09/13/2018 Bone Marrow Biopsy   Morphology (Accession: [id number]): Bone Marrow, Aspirate,Biopsy, and Clot - HYPERCELLULAR BONE MARROW WITH PLASMA CELL NEOPLASM (12% cells,  IHC showed lambda light chain restriction) - TRILINEAGE HEMATOPOIESIS. - SEE COMMENT.  Flow cytometry (Accession: [id number]):  - NO MONOCLONAL B-CELL POPULATION IDENTIFIED. - PREDOMINANCE OF T LYMPHOCYTES AND NATURAL KILLER CELLS.   01/05/2023 Pathology Results    FINAL MICROSCOPIC DIAGNOSIS:   A. STOMACH, BIOPSY:  - Gastric mucosa with focally active chronic  gastritis, see comment  - Multifocal intestinal metaplasia, negative for dysplasia   B. GE JUNCTION MASS, BIOPSY:  - Invasive moderately differentiated adenocarcinoma    01/07/2023 Imaging    IMPRESSION: 1. Distal esophageal stricture extending over about a 3.9 cm vertical excursion. No findings of metastatic disease to the chest, abdomen, or pelvis. 2. 0.5 cm sub solid nodule in the right middle lobe. Fleischner guidelines do not apply due to history of malignancy. Surveillance of the context of the patient's follow up oncology imaging is suggested. 3. Nonspecific lobulation of the left mid kidney. This could be incidental or due to CT equivocal end of a dromedary hump but I cannot exclude an underlying left mid kidney mass. This could be surveilled in the context of the patient's follow up oncology imaging, or if clinically warranted, renal MRI with and without contrast could be utilized for further characterization. 4. L1 compression fracture with 90% anterior compression and 6 mm of posterior bony retropulsion, likely late subacute chronicity. 5. Wall thickening in the proximal colon extending to the transverse colon, cannot exclude proximal colitis. 6. Other imaging findings of potential clinical significance: Coronary, aortic arch, and branch vessel atherosclerotic disease. Mitral and aortic valve calcification. Small type 1 hiatal hernia along the lower margin of the mass. Central airway thickening is present, suggesting bronchitis or reactive airways disease. Fatty deposition in the wall of the ascending colon may be incidental but which can have a weak association with inflammatory bowel disease. Lumbar spondylosis and degenerative disc disease contributing to multilevel impingement. 6 mm left kidney lower pole nonobstructive renal calculus.   Esophageal cancer (HCC)  01/05/2023 Pathology Results    FINAL MICROSCOPIC DIAGNOSIS:   A. STOMACH, BIOPSY:  - Gastric mucosa  with focally active chronic gastritis, see comment  - Multifocal intestinal metaplasia, negative for dysplasia   B. GE JUNCTION MASS, BIOPSY:  - Invasive moderately differentiated adenocarcinoma    01/07/2023 Imaging    IMPRESSION: 1. Distal esophageal stricture extending over about a 3.9 cm vertical excursion. No findings of metastatic disease to the chest, abdomen, or pelvis. 2. 0.5 cm sub solid nodule in the right middle lobe. Fleischner guidelines do not apply due to history of malignancy. Surveillance of the context of the patient's follow up oncology imaging is suggested. 3. Nonspecific lobulation of the left mid kidney. This could be incidental or due to CT equivocal end of a dromedary hump but I cannot exclude an underlying left mid kidney mass. This could be surveilled in the context of the patient's follow up oncology imaging, or if clinically warranted, renal MRI with and without contrast could be utilized for further characterization. 4. L1 compression fracture with 90% anterior compression and 6 mm of posterior bony retropulsion, likely late subacute chronicity. 5. Wall thickening in the proximal colon extending to the transverse colon, cannot exclude proximal colitis. 6. Other imaging findings of potential clinical significance: Coronary, aortic arch, and branch vessel atherosclerotic disease. Mitral and aortic valve calcification. Small type 1 hiatal hernia along the lower margin of the mass. Central airway thickening is present, suggesting bronchitis or reactive airways disease. Fatty deposition in the wall of the ascending colon  may be incidental but which can have a weak association with inflammatory bowel disease. Lumbar spondylosis and degenerative disc disease contributing to multilevel impingement. 6 mm left kidney lower pole nonobstructive renal calculus.     01/09/2023 Initial Diagnosis   Esophageal cancer (HCC)      INTERVAL HISTORY:  Daniel [name]  is here for a follow up of  esophageal cancer He was last seen by me on  He presents to the clinic accompanied by daughter. Pt is eating by mouth and still have some pain if he doesn't take small bites. Pt state takes care of himself and his ADL'S. Pt is clinically doing well.  All other systems were reviewed with the patient and are negative.  MEDICAL HISTORY:  Past Medical History:  Diagnosis Date   Blood transfusion    CKD (chronic kidney disease) stage 4, GFR 15-29 ml/min (HCC)    DVT (deep venous thrombosis) (HCC)    from surgery, no longer on Eliquis   Gout    Hearing loss    Hypertension    Hypertriglyceridemia    Hypothyroid    Prediabetes    Prostate cancer (HCC)    Pulmonary nodule    Tinnitus     SURGICAL HISTORY: Past Surgical History:  Procedure Laterality Date   BIOPSY  01/05/2023   Procedure: BIOPSY;  Surgeon: Lemar Lofty., MD;  Location: [facility];  Service: Gastroenterology;;   BIOPSY  05/13/2023   Procedure: BIOPSY;  Surgeon: Lemar Lofty., MD;  Location: [facility] ENDOSCOPY;  Service: Gastroenterology;;   CATARACT EXTRACTION Bilateral 2020   ESOPHAGOGASTRODUODENOSCOPY (EGD) WITH PROPOFOL N/A 01/05/2023   Procedure: ESOPHAGOGASTRODUODENOSCOPY (EGD) WITH PROPOFOL;  Surgeon: Lemar Lofty., MD;  Location: [facility] ENDOSCOPY;  Service: Gastroenterology;  Laterality: N/A;   ESOPHAGOGASTRODUODENOSCOPY (EGD) WITH PROPOFOL N/A 05/13/2023   Procedure: ESOPHAGOGASTRODUODENOSCOPY (EGD) WITH PROPOFOL;  Surgeon: Meridee Score Netty Starring., MD;  Location: [facility] ENDOSCOPY;  Service: Gastroenterology;  Laterality: N/A;   FOREIGN BODY REMOVAL  01/05/2023   Procedure: FOREIGN BODY REMOVAL;  Surgeon: Meridee Score Netty Starring., MD;  Location: [facility] ENDOSCOPY;  Service: Gastroenterology;;   HERNIA REPAIR Right 2010   HOT HEMOSTASIS N/A 05/13/2023   Procedure: HOT HEMOSTASIS (ARGON PLASMA COAGULATION/BICAP);  Surgeon: Lemar Lofty., MD;  Location: [facility]COPY;   Service: Gastroenterology;  Laterality: N/A;   IR GASTROSTOMY TUBE MOD SED  01/20/2023   IR NASO G TUBE PLC W/FL W/RAD  01/20/2023   left inguinal hernia repair  06/29/2008   with mesh sail   PROSTATECTOMY  1998   prostate cancer   right ankle  1980   fracture   right knee  1980   torn meniscus   TOTAL KNEE ARTHROPLASTY Right 01/20/2022   Procedure: TOTAL KNEE ARTHROPLASTY;  Surgeon: Dannielle Huh, MD;  Location: [facility] ORS;  Service: Orthopedics;  Laterality: Right;    I have reviewed the social history and family history with the patient and they are unchanged from previous note.  ALLERGIES:  has No Known Allergies.  MEDICATIONS:  Current Outpatient Medications  Medication Sig Dispense Refill   allopurinol (ZYLOPRIM) 100 MG tablet Take 100 mg by mouth every Monday, Wednesday, and Friday.     ALPRAZolam (XANAX) 0.5 MG tablet Take 0.5 mg by mouth 3 (three) times daily as needed for sleep (tremors).     amLODipine (NORVASC) 5 MG tablet Take 5 mg by mouth daily.     apixaban (ELIQUIS) 2.5 MG TABS tablet Take 1 tablet (2.5 mg total) by mouth 2 (two) times  daily. 180 tablet 3   cholecalciferol (VITAMIN D) 1000 units tablet Take 1,000 Units by mouth daily.     levothyroxine (SYNTHROID) 88 MCG tablet Take 88 mcg by mouth daily before breakfast.     metoprolol succinate (TOPROL XL) 25 MG 24 hr tablet Take 0.5 tablets (12.5 mg total) by mouth daily. 45 tablet 3   omeprazole (PRILOSEC) 40 MG capsule Open 1 capsule and sprinkle in apple sauce twice daily. 60 capsule 2   oxyCODONE (ROXICODONE) 5 MG/5ML solution Place 5 mLs (5 mg total) into feeding tube every 8 (eight) hours. 100 mL 0   sucralfate (CARAFATE) 1 GM/10ML suspension Take 10 mLs (1 g total) by mouth 4 (four) times daily. 420 mL 12   No current facility-administered medications for this visit.    PHYSICAL EXAMINATION: ECOG PERFORMANCE STATUS: 1 - Symptomatic but completely ambulatory  Vitals:   06/25/23 1048  BP: 131/66  Pulse: 74   Resp: 18  Temp: 97.6 F (36.4 C)  SpO2: 99%   Wt Readings from Last 3 Encounters:  06/25/23 143 lb 14.4 oz (65.3 kg)  05/13/23 136 lb (61.7 kg)  04/24/23 139 lb 1.6 oz (63.1 kg)     GENERAL:alert, no distress and comfortable SKIN: skin color normal, no rashes or significant lesions EYES: normal, Conjunctiva are pink and non-injected, sclera clear  NEURO: alert & oriented x 3 with fluent speech  LABORATORY DATA:  I have reviewed the data as listed    Latest Ref Rng & Units 06/25/2023    9:48 AM 04/24/2023    9:39 AM 03/27/2023    8:44 AM  CBC  WBC 4.0 - 10.5 K/uL 7.7  6.4  6.4   Hemoglobin 13.0 - 17.0 g/dL 06.3  01.6  01.0   Hematocrit 39.0 - 52.0 % 37.3  35.3  35.5   Platelets 150 - 400 K/uL 214  188  151         Latest Ref Rng & Units 06/25/2023    9:48 AM 04/24/2023    9:39 AM 03/27/2023    8:44 AM  CMP  Glucose 70 - 99 mg/dL 932  355  732   BUN 8 - 23 mg/dL 37  47  45   Creatinine 0.61 - 1.24 mg/dL 2.02  5.42  7.06   Sodium 135 - 145 mmol/L 140  140  140   Potassium 3.5 - 5.1 mmol/L 4.2  4.7  5.1   Chloride 98 - 111 mmol/L 107  107  107   CO2 22 - 32 mmol/L 26  28  29    Calcium 8.9 - 10.3 mg/dL 9.0  8.7  8.9   Total Protein 6.5 - 8.1 g/dL 6.1  5.8  5.6   Total Bilirubin 0.3 - 1.2 mg/dL 0.7  0.6  0.6   Alkaline Phos 38 - 126 U/L 65  63  61   AST 15 - 41 U/L 22  23  39   ALT 0 - 44 U/L 11  10  25        RADIOGRAPHIC STUDIES: I have personally reviewed the radiological images as listed and agreed with the findings in the report. No results found.    Orders Placed This Encounter  Procedures   IR GASTROSTOMY TUBE REMOVAL    Standing Status:   Future    Standing Expiration Date:   06/24/2024    Order Specific Question:   Reason for Exam (SYMPTOM  OR DIAGNOSIS REQUIRED)    Answer:   removal  Order Specific Question:   Informed consent is required to be obtained if the patient has a GFR less than 60mL/min (consider nephrology consult)    Answer:   Provider  notified    Order Specific Question:   Preferred Imaging Location?    Answer:   [facility]    Order Specific Question:   Release to patient    Answer:   Immediate   All questions were answered. The patient knows to call the clinic with any problems, questions or concerns. No barriers to learning was detected. The total time spent in the appointment was 25 minutes.     Malachy Mood, MD 06/25/2023   Carolin Coy, CMA, am acting as scribe for Malachy Mood, MD.   I have reviewed the above documentation for accuracy and completeness, and I agree with the above.

## 2023-06-25 NOTE — Assessment & Plan Note (Addendum)
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  -Received palliative radiation from 01/18/23-02/25/2023 -He is clinically slightly better, is able to tolerate Ensure etc.  He will try soft diet next, patient and his daughters had many questions about his diet, and I answered to their satisfaction. -We again reviewed the option of chemotherapy, HER2 antibody and immunotherapy.  I do not think immunotherapy alone without chemotherapy will be much benefit.  Due to his advanced age, CKD, the goal of quality of her life, I will hold on systemic treatment for now. -his tumor showed PD-L1 CPS 25%, I do not think immunotherapy alone is sufficient to control his disease and do not recommend.  -He is clinically doing better, he is eating some solid food now, weight is stable.  Still on tube feeds once a day. -He would like to evaluate his esophagus to see if he has residual tumor or stenosis from radiation, because he wants to eat regular food, and review his feeding tube if possible.  He had EGD on 05/13/2023 which showed extensive inflammation at the previous cancer site, biopsy showed residual invasive adenocarcinoma.

## 2023-07-06 ENCOUNTER — Other Ambulatory Visit: Payer: Self-pay

## 2023-07-06 ENCOUNTER — Emergency Department (HOSPITAL_COMMUNITY)
Admission: EM | Admit: 2023-07-06 | Discharge: 2023-07-06 | Disposition: A | Discharge status: Home / Self Care | Payer: Medicare Other | Attending: Emergency Medicine | Admitting: Emergency Medicine

## 2023-07-06 ENCOUNTER — Emergency Department (HOSPITAL_COMMUNITY): Payer: Medicare Other

## 2023-07-06 ENCOUNTER — Telehealth: Payer: Self-pay

## 2023-07-06 ENCOUNTER — Encounter (HOSPITAL_COMMUNITY): Payer: Self-pay

## 2023-07-06 DIAGNOSIS — R1319 Other dysphagia: Secondary | ICD-10-CM

## 2023-07-06 DIAGNOSIS — N189 Chronic kidney disease, unspecified: Secondary | ICD-10-CM | POA: Diagnosis not present

## 2023-07-06 DIAGNOSIS — Z7901 Long term (current) use of anticoagulants: Secondary | ICD-10-CM | POA: Diagnosis not present

## 2023-07-06 DIAGNOSIS — Z8501 Personal history of malignant neoplasm of esophagus: Secondary | ICD-10-CM | POA: Diagnosis not present

## 2023-07-06 DIAGNOSIS — R1310 Dysphagia, unspecified: Secondary | ICD-10-CM | POA: Diagnosis not present

## 2023-07-06 HISTORY — DX: Malignant neoplasm of cardia: C16.0

## 2023-07-06 LAB — CBC WITH DIFFERENTIAL/PLATELET
Abs Immature Granulocytes: 0.02 10*3/uL (ref 0.00–0.07)
Basophils Absolute: 0 10*3/uL (ref 0.0–0.1)
Basophils Relative: 1 %
Eosinophils Absolute: 0.1 10*3/uL (ref 0.0–0.5)
Eosinophils Relative: 1 %
HCT: 36.1 % — ABNORMAL LOW (ref 39.0–52.0)
Hemoglobin: 12.2 g/dL — ABNORMAL LOW (ref 13.0–17.0)
Immature Granulocytes: 0 %
Lymphocytes Relative: 14 %
Lymphs Abs: 1 10*3/uL (ref 0.7–4.0)
MCH: 35.9 pg — ABNORMAL HIGH (ref 26.0–34.0)
MCHC: 33.8 g/dL (ref 30.0–36.0)
MCV: 106.2 fL — ABNORMAL HIGH (ref 80.0–100.0)
Monocytes Absolute: 0.5 10*3/uL (ref 0.1–1.0)
Monocytes Relative: 6 %
Neutro Abs: 6.1 10*3/uL (ref 1.7–7.7)
Neutrophils Relative %: 78 %
Platelets: 245 10*3/uL (ref 150–400)
RBC: 3.4 MIL/uL — ABNORMAL LOW (ref 4.22–5.81)
RDW: 13.2 % (ref 11.5–15.5)
WBC: 7.7 10*3/uL (ref 4.0–10.5)
nRBC: 0 % (ref 0.0–0.2)

## 2023-07-06 LAB — COMPREHENSIVE METABOLIC PANEL
ALT: 12 U/L (ref 0–44)
AST: 24 U/L (ref 15–41)
Albumin: 3.3 g/dL — ABNORMAL LOW (ref 3.5–5.0)
Alkaline Phosphatase: 64 U/L (ref 38–126)
Anion gap: 8 (ref 5–15)
BUN: 36 mg/dL — ABNORMAL HIGH (ref 8–23)
CO2: 24 mmol/L (ref 22–32)
Calcium: 8.8 mg/dL — ABNORMAL LOW (ref 8.9–10.3)
Chloride: 107 mmol/L (ref 98–111)
Creatinine, Ser: 2.16 mg/dL — ABNORMAL HIGH (ref 0.61–1.24)
GFR, Estimated: 28 mL/min — ABNORMAL LOW (ref >60)
Glucose, Bld: 97 mg/dL (ref 70–99)
Potassium: 4.7 mmol/L (ref 3.5–5.1)
Sodium: 139 mmol/L (ref 135–145)
Total Bilirubin: 1 mg/dL (ref 0.3–1.2)
Total Protein: 6 g/dL — ABNORMAL LOW (ref 6.5–8.1)

## 2023-07-06 LAB — LIPASE, BLOOD: Lipase: 26 U/L (ref 11–51)

## 2023-07-06 MED ORDER — IOHEXOL 9 MG/ML PO SOLN
ORAL | Status: AC
  Filled 2023-07-06: qty 1000

## 2023-07-06 MED ORDER — LACTATED RINGERS IV BOLUS
500.0000 mL | Freq: Once | INTRAVENOUS | Status: AC
  Administered 2023-07-06: 500 mL via INTRAVENOUS

## 2023-07-06 MED ORDER — PANTOPRAZOLE SODIUM 40 MG IV SOLR
40.0000 mg | Freq: Once | INTRAVENOUS | Status: AC
  Administered 2023-07-06: 40 mg via INTRAVENOUS
  Filled 2023-07-06: qty 10

## 2023-07-06 MED ORDER — IOHEXOL 9 MG/ML PO SOLN
500.0000 mL | ORAL | Status: AC
  Administered 2023-07-06 (×2): 500 mL via ORAL

## 2023-07-06 NOTE — Telephone Encounter (Signed)
Patients daughter called in stating that the patient is not eating well and he is not able to keep food down since Thursday. He is scheduled to have his feeding tube removed on Wednesday.  He has not been to the bathroom all weekend. Called into the weekend triage nurse line and they instructed her to call in first thing this morning. I spoke with Namon Cirri as per Dr. Mosetta Putt and after speaking with Santiago Glad NP thought it best to have him go to the ER to be seen to rule out a blockage. Called the family back and made them aware  they stated they would take him to [facility] ER.

## 2023-07-06 NOTE — ED Triage Notes (Signed)
Pt presents with difficulty swallowing that started on 7/18 when he felt like something became stuck and was spitting up mucous. Yesterday pt had some mild resolution and was able to swallow liquid. Pt denies vomiting today. Pt was advised by oncologist to be evaluated in the ED.

## 2023-07-06 NOTE — ED Provider Notes (Signed)
[facility] Provider Note   CSN: [id number] Arrival date & time: 07/06/23  1100     History  Chief Complaint  Patient presents with   Dysphagia    Daniel [name] is a 89 y.o. male.  HPI 89 year old male with a history of esophageal cancer presents with abdominal pain and trouble swallowing.  Patient and daughter provide the history.  4 days ago started having recurrent trouble swallowing with vomiting and phlegm production.  Was similar to when he was first having problems with esophageal cancer.  He is status post radiation.  However yesterday evening he seemed to be able to swallow again and now is able to swallow food and drink this morning.  He always has to make things small to eat them.  He has a G-tube but is considering getting it removed in 2 days as he has not been using it.  No chest pain or shortness of breath.  However whenever these issues were occurring he was getting a lot of burning going up into his chest.  Called the oncologist who advised him to get seen for evaluation.  He has a little bit of upper abdominal pain that he attributes to having the G-tube in place.  Home Medications Prior to Admission medications   Medication Sig Start Date End Date Taking? Authorizing Provider  allopurinol (ZYLOPRIM) 100 MG tablet Take 100 mg by mouth every Monday, Wednesday, and Friday. 12/02/21   [provider]  ALPRAZolam Prudy Feeler) 0.5 MG tablet Take 0.5 mg by mouth 3 (three) times daily as needed for sleep (tremors).    [provider]  amLODipine (NORVASC) 5 MG tablet Take 5 mg by mouth daily. 12/02/21   [provider]  apixaban (ELIQUIS) 2.5 MG TABS tablet Take 1 tablet (2.5 mg total) by mouth 2 (two) times daily. 05/15/23   Mansouraty, Netty Starring., MD  cholecalciferol (VITAMIN D) 1000 units tablet Take 1,000 Units by mouth daily.    [provider]  levothyroxine (SYNTHROID) 88 MCG tablet Take 88 mcg  by mouth daily before breakfast. 08/17/18   [provider]  metoprolol succinate (TOPROL XL) 25 MG 24 hr tablet Take 0.5 tablets (12.5 mg total) by mouth daily. 12/24/22   Tobb, Kardie, DO  omeprazole (PRILOSEC) 40 MG capsule Open 1 capsule and sprinkle in apple sauce twice daily. 12/23/22   Mansouraty, Netty Starring., MD  oxyCODONE (ROXICODONE) 5 MG/5ML solution Place 5 mLs (5 mg total) into feeding tube every 8 (eight) hours. 01/20/23   Allred, Darrell K, PA-C  sucralfate (CARAFATE) 1 GM/10ML suspension Take 10 mLs (1 g total) by mouth 4 (four) times daily. 05/13/23 05/12/24  Mansouraty, Netty Starring., MD      Allergies    Patient has no known allergies.    Review of Systems   Review of Systems  Constitutional:  Negative for fever.  Respiratory:  Negative for shortness of breath.   Cardiovascular:  Negative for chest pain.  Gastrointestinal:  Positive for abdominal pain and vomiting.    Physical Exam Updated Vital Signs BP 130/74   Pulse 69   Temp 98 F (36.7 C) (Oral)   Resp 14   Ht 5' 5.5" (1.664 m)   Wt 65.3 kg   SpO2 99%   BMI 23.60 kg/m  Physical Exam Vitals and nursing note reviewed.  Constitutional:      Appearance: He is well-developed. He is not ill-appearing or diaphoretic.  HENT:     Head:  Normocephalic and atraumatic.  Cardiovascular:     Rate and Rhythm: Normal rate and regular rhythm.     Heart sounds: Normal heart sounds.  Pulmonary:     Effort: Pulmonary effort is normal.     Breath sounds: Normal breath sounds.  Abdominal:     Palpations: Abdomen is soft.     Tenderness: There is abdominal tenderness in the epigastric area.    Skin:    General: Skin is warm and dry.  Neurological:     Mental Status: He is alert.     ED Results / Procedures / Treatments   Labs (all labs ordered are listed, but only abnormal results are displayed) Labs Reviewed  COMPREHENSIVE METABOLIC PANEL - Abnormal; Notable for the following components:      Result Value    BUN 36 (*)    Creatinine, Ser 2.16 (*)    Calcium 8.8 (*)    Total Protein 6.0 (*)    Albumin 3.3 (*)    GFR, Estimated 28 (*)    All other components within normal limits  CBC WITH DIFFERENTIAL/PLATELET - Abnormal; Notable for the following components:   RBC 3.40 (*)    Hemoglobin 12.2 (*)    HCT 36.1 (*)    MCV 106.2 (*)    MCH 35.9 (*)    All other components within normal limits  LIPASE, BLOOD    EKG EKG Interpretation Date/Time:  Monday July 06 2023 11:57:25 EDT Ventricular Rate:  67 PR Interval:    QRS Duration:  98 QT Interval:  439 QTC Calculation: 488 R Axis:   -52  Text Interpretation: Atrial fibrillation LAD, consider left anterior fascicular block Borderline prolonged QT interval overall similar to Jan 2023 Confirmed by Pricilla Loveless [phone]) on 07/06/2023 12:14:09 PM  Radiology CT CHEST ABDOMEN PELVIS WO CONTRAST  Result Date: 07/06/2023 CLINICAL DATA:  Esophageal cancer.  Difficulty swallowing. EXAM: CT CHEST, ABDOMEN AND PELVIS WITHOUT CONTRAST TECHNIQUE: Multidetector CT imaging of the chest, abdomen and pelvis was performed following the standard protocol without IV contrast. RADIATION DOSE REDUCTION: This exam was performed according to the departmental dose-optimization program which includes automated exposure control, adjustment of the mA and/or kV according to patient size and/or use of iterative reconstruction technique. COMPARISON:  CT abdomen and pelvis 01/07/2023 FINDINGS: CT CHEST FINDINGS Cardiovascular: There are atherosclerotic calcifications of the aorta and coronary arteries. The heart is normal in size. There is no evidence for aneurysm. There is no pericardial effusion. Mediastinum/Nodes: There is a enlarged subcarinal lymph node measuring 1.9 cm which has increased in size. No new enlarged lymph nodes are seen. The esophagus is nondilated. There is focal thickening of the distal esophagus similar to the prior study with mild surrounding inflammation  likely related to patient's known carcinoma. Lungs/Pleura: There are new small to moderate-sized bilateral pleural effusions. There is bilateral lower lobe atelectasis and minimal ground-glass opacities. There is a new subpleural nodule in the right upper lobe measuring 7 mm image 6/23. There is a new fissural nodule in the right upper lobe image 6/77 measuring 8 mm. There is no pneumothorax. There is bilateral lower lobe peribronchial wall thickening. Trachea and central airways are patent. Previously identified right middle lobe 5 mm nodule is no longer seen. Musculoskeletal: No chest wall mass or suspicious bone lesions identified. There severe degenerative changes of the shoulders, right greater than left. CT ABDOMEN PELVIS FINDINGS Hepatobiliary: No focal liver abnormality is seen. No gallstones, gallbladder wall thickening, or biliary dilatation. Pancreas: Unremarkable.  No pancreatic ductal dilatation or surrounding inflammatory changes. Spleen: Normal in size without focal abnormality. Adrenals/Urinary Tract: Questionable solid rounded lesion in the left kidney measures 2.1 cm in diameter, unchanged. There is no hydronephrosis or perinephric fluid. Nonobstructing left renal calculus is unchanged. The adrenal glands and bladder are within normal limits. Stomach/Bowel: Appendix appears normal. No evidence of bowel wall thickening, distention, or inflammatory changes. There is large stool burden. Percutaneous gastrostomy tube is in the body of the stomach. Stomach is otherwise within normal limits. Vascular/Lymphatic: Aortic atherosclerosis. No enlarged abdominal or pelvic lymph nodes. Pelvic sidewall surgical clips are again noted bilaterally. Reproductive: Prostatectomy. Other: There is no ascites or focal abdominal wall hernia. Musculoskeletal: L1 compression deformity is stable. No new fractures. No suspicious focal osseous lesion. IMPRESSION: 1. New small to moderate-sized bilateral pleural effusions. 2.  New right upper lobe pulmonary nodules measuring up to 8 mm, indeterminate. Metastatic disease is not excluded. 3. Enlarging subcarinal lymph node worrisome for metastatic disease. 4. Stable distal esophageal wall thickening compatible with patient's known carcinoma. 5. No evidence for metastatic disease in the abdomen or pelvis. 6. Percutaneous gastrostomy tube in place. 7. Stable questionable solid lesion in the left kidney. This can be further evaluated with ultrasound or MRI. Aortic Atherosclerosis (ICD10-I70.0). Electronically Signed   By: Darliss Cheney M.D.   On: 07/06/2023 15:17    Procedures Procedures    Medications Ordered in ED Medications  pantoprazole (PROTONIX) injection 40 mg (40 mg Intravenous Given 07/06/23 1208)  lactated ringers bolus 500 mL (0 mLs Intravenous Stopped 07/06/23 1300)  iohexol (OMNIPAQUE) 9 MG/ML oral solution 500 mL (500 mLs Oral Contrast Given 07/06/23 1152)    ED Course/ Medical Decision Making/ A&P                             Medical Decision Making Amount and/or Complexity of Data Reviewed Labs: ordered.    Details: Chronic kidney disease, unchanged.  Unchanged anemia Radiology: ordered and independent interpretation performed.    Details: Pleural effusions.  No SBO. ECG/medicine tests: ordered and independent interpretation performed.    Details: No acute ischemia  Risk Prescription drug management.   Patient has slower swallowing but he is able to swallow here.  I suspect this is pretty close to his baseline based on talking to patient and daughter.  Unclear why he was having trouble over the weekend but currently he is swallowing fine and his CT does not show any obvious signs of an obstruction.  At this point, I do not think he needs an emergent EGD and can follow-up with Dr. Mosetta Putt and his GI doctor as an outpatient.  He does have some nodules which I made him aware of and he will need to follow-up with his oncologist.  Otherwise, he does have some  pleural effusion seen but is not complaining of shortness of breath and I do not think immediate diuresis or other workup is warranted.  Labs are at baseline.  Will discharge home with return precautions.        Final Clinical Impression(s) / ED Diagnoses Final diagnoses:  Esophageal dysphagia    Rx / DC Orders ED Discharge Orders     None         Pricilla Loveless, MD 07/06/23 [phone]

## 2023-07-06 NOTE — Discharge Instructions (Signed)
Your CT scan shows some nodules/spots in your lungs that could be related to your esophagus cancer.  You need to call your oncologist tomorrow for outpatient follow-up.  Also talk to your GI specialist in regards to the trouble swallowing.  If you develop inability to swallow, new or worsening pain, or any other new/concerning symptoms then return to the ER or call 911.

## 2023-07-07 ENCOUNTER — Other Ambulatory Visit: Payer: Self-pay

## 2023-07-07 ENCOUNTER — Telehealth: Payer: Self-pay

## 2023-07-07 DIAGNOSIS — K2289 Other specified disease of esophagus: Secondary | ICD-10-CM

## 2023-07-07 DIAGNOSIS — R1310 Dysphagia, unspecified: Secondary | ICD-10-CM

## 2023-07-07 NOTE — Telephone Encounter (Signed)
Order entered for barium swallow with no tablet.  I have sent the order to the schedulers

## 2023-07-07 NOTE — Telephone Encounter (Signed)
Mansouraty, Netty Starring., MD  Pricilla Loveless, MD; Malachy Mood, MD; Thomasene Ripple, DO; Loretha Stapler, RN SG, Thanks for forwarding this result note to me. This can be difficult to manage endoscopically with his persisting esophageal cancer disease.  YF and KT, Thank esophagus stenting will have a risk of migration and significant GERD symptoms but if we get to the point of needing that I can certainly consider this.  I do not have ability to perform cryo ablation to the area to see if that would make a difference as of yet.  With the bilateral pleural effusions, query if he is having some sort of progression of his disease in regards to metastatic, though with bilateral pleural effusions seems like he may need some diuretic therapy too. I would not plan a repeat endoscopy for now, but I can do and set up a barium swallow to see where things stand overall. I will have my team work on that. GM  Demorris Choyce, Please schedule a barium swallow for this patient, no tablet. Thanks. GM

## 2023-07-08 ENCOUNTER — Other Ambulatory Visit: Payer: Self-pay | Admitting: Gastroenterology

## 2023-07-08 ENCOUNTER — Ambulatory Visit (HOSPITAL_COMMUNITY)
Admission: RE | Admit: 2023-07-08 | Discharge: 2023-07-08 | Disposition: A | Discharge status: Home / Self Care | Payer: Medicare Other | Source: Ambulatory Visit | Attending: Hematology | Admitting: Hematology

## 2023-07-08 DIAGNOSIS — Z431 Encounter for attention to gastrostomy: Secondary | ICD-10-CM | POA: Insufficient documentation

## 2023-07-08 DIAGNOSIS — C159 Malignant neoplasm of esophagus, unspecified: Secondary | ICD-10-CM | POA: Diagnosis not present

## 2023-07-08 DIAGNOSIS — C155 Malignant neoplasm of lower third of esophagus: Secondary | ICD-10-CM

## 2023-07-08 HISTORY — PX: IR GASTROSTOMY TUBE REMOVAL: IMG5492

## 2023-07-08 NOTE — Procedures (Signed)
Per order of Dr. Mosetta Putt, pt's 16 fr balloon retention gastrostomy tube was removed in its entirety without immediate complications. Gauze dressing applied to site. EBL none. Site care instructions reviewed with pt/family.

## 2023-07-13 ENCOUNTER — Telehealth: Payer: Self-pay | Admitting: Hematology

## 2023-07-13 ENCOUNTER — Telehealth: Payer: Self-pay

## 2023-07-13 DIAGNOSIS — R1319 Other dysphagia: Secondary | ICD-10-CM

## 2023-07-13 DIAGNOSIS — C155 Malignant neoplasm of lower third of esophagus: Secondary | ICD-10-CM

## 2023-07-13 NOTE — Telephone Encounter (Signed)
Spoke w/pt's wife regarding f/u appt with Dr. Mosetta Putt after having pt's feeding tube removed.  Pt is currently scheduled for a f/u w/Dr. Mosetta Putt in October.  Pt's wife agreed to have a f/u appt scheduled w/in the next 1 to 2 wks w/Dr. Mosetta Putt.  Stated to pt's wife, that Patricia Nettle from Dr. Latanya Maudlin Scheduling Team will contact her to get the pt scheduled w/in the next 1 to 2 wks.  Pt's wife verbalized understanding and had no further questions or concerns.  Sent Secure Chat message to Erie Noe to contact pt to get him scheduled w/Dr. Mosetta Putt w/in the next 1 to 2 wks.

## 2023-07-21 ENCOUNTER — Encounter: Payer: Self-pay | Admitting: Hematology

## 2023-07-21 ENCOUNTER — Other Ambulatory Visit: Payer: Self-pay

## 2023-07-21 ENCOUNTER — Inpatient Hospital Stay: Payer: Medicare Other | Attending: Physician Assistant | Admitting: Hematology

## 2023-07-21 VITALS — BP 111/72 | HR 78 | Temp 97.5°F | Resp 18 | Ht 65.5 in | Wt 137.5 lb

## 2023-07-21 DIAGNOSIS — C155 Malignant neoplasm of lower third of esophagus: Secondary | ICD-10-CM | POA: Diagnosis present

## 2023-07-21 DIAGNOSIS — C779 Secondary and unspecified malignant neoplasm of lymph node, unspecified: Secondary | ICD-10-CM | POA: Insufficient documentation

## 2023-07-21 DIAGNOSIS — C7951 Secondary malignant neoplasm of bone: Secondary | ICD-10-CM | POA: Insufficient documentation

## 2023-07-21 NOTE — Assessment & Plan Note (Signed)
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  -Received palliative radiation from 01/18/23-02/25/2023 -He is clinically slightly better, is able to tolerate Ensure etc.  He will try soft diet next, patient and his daughters had many questions about his diet, and I answered to their satisfaction. -We again reviewed the option of chemotherapy, HER2 antibody and immunotherapy.  I do not think immunotherapy alone without chemotherapy will be much benefit.  Due to his advanced age, CKD, the goal of quality of her life, I will hold on systemic treatment for now. -his tumor showed PD-L1 CPS 25%, I do not think immunotherapy alone is sufficient to control his disease and do not recommend.  -He is clinically doing better, he is eating some solid food now, weight is stable.  Still on tube feeds once a day. -He would like to evaluate his esophagus to see if he has residual tumor or stenosis from radiation, because he wants to eat regular food, and review his feeding tube if possible.  He had EGD on 05/13/2023 which showed extensive inflammation at the previous cancer site, biopsy showed residual invasive adenocarcinoma.

## 2023-07-21 NOTE — Progress Notes (Signed)
[facility]   [phone] Fax:(336) [phone]   Clinic Follow up Note   Patient Care Team: Loyola Mast, NP as PCP - General (Family Medicine) Thomasene Ripple, DO as PCP - Cardiology (Cardiology) Malachy Mood, MD as Attending Physician (Hematology and Oncology)  Date of Service:  07/21/2023  CHIEF COMPLAINT: f/u of esophageal cancer   CURRENT THERAPY:  Supportive care   ASSESSMENT:  Daniel [name] is a 89 y.o. male with   Esophageal cancer (HCC) stage IV, with nodal and bone metastasis; MMR normal, PD-L1 + (CPS 25%), HER 2+ by IHC, positive by FISH -diagnosed in 12/2022 -PET 01/04/23 showed hypermetabolic distal esophageal uptake, and hypermetabolic mediastinal and bilateral hilar adenopathy, right pubic bone and anterior left sixth ribs, concerning for metastatic disease.  -Received palliative radiation from 01/18/23-02/25/2023 -He is clinically slightly better, is able to tolerate Ensure etc.  He will try soft diet next, patient and his daughters had many questions about his diet, and I answered to their satisfaction. -We again reviewed the option of chemotherapy, HER2 antibody and immunotherapy.  I do not think immunotherapy alone without chemotherapy will be much benefit.  Due to his advanced age, CKD, the goal of quality of her life, I will hold on systemic treatment for now. -his tumor showed PD-L1 CPS 25%, I do not think immunotherapy alone is sufficient to control his disease and do not recommend.  -He is clinically doing better, he is eating some solid food now, weight is stable.  Still on tube feeds once a day. -He would like to evaluate his esophagus to see if he has residual tumor or stenosis from radiation, because he wants to eat regular food, and review his feeding tube if possible.  He had EGD on 05/13/2023 which showed extensive inflammation at the previous cancer site, biopsy showed residual invasive adenocarcinoma. -Patient is overall clinically stable,  does not have significant dysphagia, has lost some weight.  We reviewed nutritional supplement -He is scheduled for barium swallow next week, and follow-up with Dr. Lorenso Courier already.  He is not a candidate for esophageal stent placement if he has significant dysphagia due to the location.  He is open to having feeding tube placed again if he needs in the future. -He unfortunately is not a candidate for chemotherapy due to his advanced age, I discussed role of immunotherapy and trastuzumab therapy although we do not have enough clinical data of their efficacy without chemo. He wants to think about it.    PLAN: -Pt has an appt w/ Dr.Mansouraty -Continue the omeprazole for acid reflux -lab and f/u in 6 weeks  SUMMARY OF ONCOLOGIC HISTORY: Oncology History Overview Note   Cancer Staging  Lambda light chain myeloma (HCC) Staging form: Multiple Myeloma, AJCC 6th Edition - Clinical stage from 09/06/2018: Stage IB - Signed by Arthur Holms, MD on 09/20/2018     Lambda light chain myeloma (HCC)  09/02/2018 Initial Diagnosis   Lambda light chain myeloma (HCC)   09/06/2018 Miscellaneous   Baseline labs:  Kappa 56, lambda 4012, ratio 0.01 SPEP: M-spike 0.1 g/dl, monoclonal lambda light chain UPEP: M-spike 178 mg/dl, Bence Jones Protein positive; lambda type. Beta-2 micro: 5.9 Normal immunoglogulin heavy chains Hgb 11.4 Creatinine 2.08 No hypercalcemia   09/09/2018 Imaging   Metastatic skeletal survey negative   09/13/2018 Bone Marrow Biopsy   Morphology (Accession: [id number]): Bone Marrow, Aspirate,Biopsy, and Clot - HYPERCELLULAR BONE MARROW WITH PLASMA CELL NEOPLASM (12% cells, IHC showed lambda light chain restriction) - TRILINEAGE HEMATOPOIESIS. -  SEE COMMENT.  Flow cytometry (Accession: [id number]):  - NO MONOCLONAL B-CELL POPULATION IDENTIFIED. - PREDOMINANCE OF T LYMPHOCYTES AND NATURAL KILLER CELLS.   01/05/2023 Pathology Results    FINAL MICROSCOPIC DIAGNOSIS:   A. STOMACH, BIOPSY:   - Gastric mucosa with focally active chronic gastritis, see comment  - Multifocal intestinal metaplasia, negative for dysplasia   B. GE JUNCTION MASS, BIOPSY:  - Invasive moderately differentiated adenocarcinoma    01/07/2023 Imaging    IMPRESSION: 1. Distal esophageal stricture extending over about a 3.9 cm vertical excursion. No findings of metastatic disease to the chest, abdomen, or pelvis. 2. 0.5 cm sub solid nodule in the right middle lobe. Fleischner guidelines do not apply due to history of malignancy. Surveillance of the context of the patient's follow up oncology imaging is suggested. 3. Nonspecific lobulation of the left mid kidney. This could be incidental or due to CT equivocal end of a dromedary hump but I cannot exclude an underlying left mid kidney mass. This could be surveilled in the context of the patient's follow up oncology imaging, or if clinically warranted, renal MRI with and without contrast could be utilized for further characterization. 4. L1 compression fracture with 90% anterior compression and 6 mm of posterior bony retropulsion, likely late subacute chronicity. 5. Wall thickening in the proximal colon extending to the transverse colon, cannot exclude proximal colitis. 6. Other imaging findings of potential clinical significance: Coronary, aortic arch, and branch vessel atherosclerotic disease. Mitral and aortic valve calcification. Small type 1 hiatal hernia along the lower margin of the mass. Central airway thickening is present, suggesting bronchitis or reactive airways disease. Fatty deposition in the wall of the ascending colon may be incidental but which can have a weak association with inflammatory bowel disease. Lumbar spondylosis and degenerative disc disease contributing to multilevel impingement. 6 mm left kidney lower pole nonobstructive renal calculus.   Esophageal cancer (HCC)  01/05/2023 Pathology Results    FINAL MICROSCOPIC  DIAGNOSIS:   A. STOMACH, BIOPSY:  - Gastric mucosa with focally active chronic gastritis, see comment  - Multifocal intestinal metaplasia, negative for dysplasia   B. GE JUNCTION MASS, BIOPSY:  - Invasive moderately differentiated adenocarcinoma    01/07/2023 Imaging    IMPRESSION: 1. Distal esophageal stricture extending over about a 3.9 cm vertical excursion. No findings of metastatic disease to the chest, abdomen, or pelvis. 2. 0.5 cm sub solid nodule in the right middle lobe. Fleischner guidelines do not apply due to history of malignancy. Surveillance of the context of the patient's follow up oncology imaging is suggested. 3. Nonspecific lobulation of the left mid kidney. This could be incidental or due to CT equivocal end of a dromedary hump but I cannot exclude an underlying left mid kidney mass. This could be surveilled in the context of the patient's follow up oncology imaging, or if clinically warranted, renal MRI with and without contrast could be utilized for further characterization. 4. L1 compression fracture with 90% anterior compression and 6 mm of posterior bony retropulsion, likely late subacute chronicity. 5. Wall thickening in the proximal colon extending to the transverse colon, cannot exclude proximal colitis. 6. Other imaging findings of potential clinical significance: Coronary, aortic arch, and branch vessel atherosclerotic disease. Mitral and aortic valve calcification. Small type 1 hiatal hernia along the lower margin of the mass. Central airway thickening is present, suggesting bronchitis or reactive airways disease. Fatty deposition in the wall of the ascending colon may be incidental but which can have a weak association  with inflammatory bowel disease. Lumbar spondylosis and degenerative disc disease contributing to multilevel impingement. 6 mm left kidney lower pole nonobstructive renal calculus.     01/09/2023 Initial Diagnosis   Esophageal  cancer (HCC)      INTERVAL HISTORY:  Daniel [name] is here for a follow up of esophageal cancer. He was last seen by me on 06/25/2023. He presents to the clinic accompanied by daughter. Pt state that he is able to swallow but have pain in the abdominal after he eats. Pt report having fatigue.     All other systems were reviewed with the patient and are negative.  MEDICAL HISTORY:  Past Medical History:  Diagnosis Date   Blood transfusion    CKD (chronic kidney disease) stage 4, GFR 15-29 ml/min (HCC)    DVT (deep venous thrombosis) (HCC)    from surgery, no longer on Eliquis   Gastroesophageal cancer (HCC)    Gout    Hearing loss    Hypertension    Hypertriglyceridemia    Hypothyroid    Prediabetes    Prostate cancer (HCC)    Pulmonary nodule    Tinnitus     SURGICAL HISTORY: Past Surgical History:  Procedure Laterality Date   BIOPSY  01/05/2023   Procedure: BIOPSY;  Surgeon: Lemar Lofty., MD;  Location: [facility];  Service: Gastroenterology;;   BIOPSY  05/13/2023   Procedure: BIOPSY;  Surgeon: Lemar Lofty., MD;  Location: [facility] ENDOSCOPY;  Service: Gastroenterology;;   CATARACT EXTRACTION Bilateral 2020   ESOPHAGOGASTRODUODENOSCOPY (EGD) WITH PROPOFOL N/A 01/05/2023   Procedure: ESOPHAGOGASTRODUODENOSCOPY (EGD) WITH PROPOFOL;  Surgeon: Lemar Lofty., MD;  Location: [facility];  Service: Gastroenterology;  Laterality: N/A;   ESOPHAGOGASTRODUODENOSCOPY (EGD) WITH PROPOFOL N/A 05/13/2023   Procedure: ESOPHAGOGASTRODUODENOSCOPY (EGD) WITH PROPOFOL;  Surgeon: Meridee Score Netty Starring., MD;  Location: [facility] ENDOSCOPY;  Service: Gastroenterology;  Laterality: N/A;   FOREIGN BODY REMOVAL  01/05/2023   Procedure: FOREIGN BODY REMOVAL;  Surgeon: Meridee Score Netty Starring., MD;  Location: [facility] ENDOSCOPY;  Service: Gastroenterology;;   HERNIA REPAIR Right 2010   HOT HEMOSTASIS N/A 05/13/2023   Procedure: HOT HEMOSTASIS (ARGON PLASMA COAGULATION/BICAP);  Surgeon:  Lemar Lofty., MD;  Location: [facility] ENDOSCOPY;  Service: Gastroenterology;  Laterality: N/A;   IR GASTROSTOMY TUBE MOD SED  01/20/2023   IR GASTROSTOMY TUBE REMOVAL  07/08/2023   IR NASO G TUBE PLC W/FL W/RAD  01/20/2023   left inguinal hernia repair  06/29/2008   with mesh sail   PROSTATECTOMY  1998   prostate cancer   right ankle  1980   fracture   right knee  1980   torn meniscus   TOTAL KNEE ARTHROPLASTY Right 01/20/2022   Procedure: TOTAL KNEE ARTHROPLASTY;  Surgeon: Dannielle Huh, MD;  Location: [facility] ORS;  Service: Orthopedics;  Laterality: Right;    I have reviewed the social history and family history with the patient and they are unchanged from previous note.  ALLERGIES:  has No Known Allergies.  MEDICATIONS:  Current Outpatient Medications  Medication Sig Dispense Refill   allopurinol (ZYLOPRIM) 100 MG tablet Take 100 mg by mouth every Monday, Wednesday, and Friday.     ALPRAZolam (XANAX) 0.5 MG tablet Take 0.5 mg by mouth 3 (three) times daily as needed for sleep (tremors).     amLODipine (NORVASC) 5 MG tablet Take 5 mg by mouth daily.     apixaban (ELIQUIS) 2.5 MG TABS tablet Take 1 tablet (2.5 mg total) by mouth 2 (two) times daily. 180 tablet  3   cholecalciferol (VITAMIN D) 1000 units tablet Take 1,000 Units by mouth daily.     levothyroxine (SYNTHROID) 88 MCG tablet Take 88 mcg by mouth daily before breakfast.     metoprolol succinate (TOPROL XL) 25 MG 24 hr tablet Take 0.5 tablets (12.5 mg total) by mouth daily. 45 tablet 3   omeprazole (PRILOSEC) 40 MG capsule OPEN 1 CAPSULE AND SPRINKLE IN APPLESAUCE AND EAT TWICE DAILY 60 capsule 0   oxyCODONE (ROXICODONE) 5 MG/5ML solution Place 5 mLs (5 mg total) into feeding tube every 8 (eight) hours. 100 mL 0   sucralfate (CARAFATE) 1 GM/10ML suspension Take 10 mLs (1 g total) by mouth 4 (four) times daily. 420 mL 12   No current facility-administered medications for this visit.    PHYSICAL EXAMINATION: ECOG PERFORMANCE  STATUS: 1 - Symptomatic but completely ambulatory  Vitals:   07/21/23 1148  BP: 111/72  Pulse: 78  Resp: 18  Temp: (!) 97.5 F (36.4 C)  SpO2: 100%   Wt Readings from Last 3 Encounters:  07/21/23 137 lb 8 oz (62.4 kg)  07/06/23 144 lb (65.3 kg)  06/25/23 143 lb 14.4 oz (65.3 kg)     GENERAL:alert, no distress and comfortable SKIN: skin color normal, no rashes or significant lesions EYES: normal, Conjunctiva are pink and non-injected, sclera clear  NEURO: alert & oriented x 3 with fluent speech LABORATORY DATA:  I have reviewed the data as listed    Latest Ref Rng & Units 07/06/2023   12:03 PM 06/25/2023    9:48 AM 04/24/2023    9:39 AM  CBC  WBC 4.0 - 10.5 K/uL 7.7  7.7  6.4   Hemoglobin 13.0 - 17.0 g/dL 29.5  62.1  30.8   Hematocrit 39.0 - 52.0 % 36.1  37.3  35.3   Platelets 150 - 400 K/uL 245  214  188         Latest Ref Rng & Units 07/06/2023   12:03 PM 06/25/2023    9:48 AM 04/24/2023    9:39 AM  CMP  Glucose 70 - 99 mg/dL 97  657  846   BUN 8 - 23 mg/dL 36  37  47   Creatinine 0.61 - 1.24 mg/dL 9.62  9.52  8.41   Sodium 135 - 145 mmol/L 139  140  140   Potassium 3.5 - 5.1 mmol/L 4.7  4.2  4.7   Chloride 98 - 111 mmol/L 107  107  107   CO2 22 - 32 mmol/L 24  26  28    Calcium 8.9 - 10.3 mg/dL 8.8  9.0  8.7   Total Protein 6.5 - 8.1 g/dL 6.0  6.1  5.8   Total Bilirubin 0.3 - 1.2 mg/dL 1.0  0.7  0.6   Alkaline Phos 38 - 126 U/L 64  65  63   AST 15 - 41 U/L 24  22  23    ALT 0 - 44 U/L 12  11  10        RADIOGRAPHIC STUDIES: I have personally reviewed the radiological images as listed and agreed with the findings in the report. No results found.    No orders of the defined types were placed in this encounter.  All questions were answered. The patient knows to call the clinic with any problems, questions or concerns. No barriers to learning was detected. The total time spent in the appointment was 25 minutes.     Malachy Mood, MD 07/21/2023   I,  Monica Martinez, CMA, am acting as scribe for Malachy Mood, MD.   I have reviewed the above documentation for accuracy and completeness, and I agree with the above.

## 2023-07-29 ENCOUNTER — Ambulatory Visit (HOSPITAL_COMMUNITY)
Admission: RE | Admit: 2023-07-29 | Discharge: 2023-07-29 | Disposition: A | Discharge status: Home / Self Care | Payer: Medicare Other | Source: Ambulatory Visit | Attending: Gastroenterology | Admitting: Gastroenterology

## 2023-07-29 DIAGNOSIS — K2289 Other specified disease of esophagus: Secondary | ICD-10-CM | POA: Diagnosis present

## 2023-07-29 DIAGNOSIS — R1310 Dysphagia, unspecified: Secondary | ICD-10-CM | POA: Diagnosis present

## 2023-07-29 MED ORDER — IOHEXOL 300 MG/ML  SOLN
50.0000 mL | Freq: Once | INTRAMUSCULAR | Status: AC | PRN
  Administered 2023-07-29: 40 mL via ORAL

## 2023-08-03 ENCOUNTER — Telehealth: Payer: Self-pay | Admitting: Gastroenterology

## 2023-08-03 NOTE — Telephone Encounter (Signed)
See results note. 

## 2023-08-03 NOTE — Telephone Encounter (Signed)
Inbound call from patient daughter states she returning call. Please advise.   Thank you

## 2023-08-04 ENCOUNTER — Telehealth (INDEPENDENT_AMBULATORY_CARE_PROVIDER_SITE_OTHER): Payer: Medicare Other | Admitting: Gastroenterology

## 2023-08-04 DIAGNOSIS — R1319 Other dysphagia: Secondary | ICD-10-CM

## 2023-08-04 DIAGNOSIS — F1011 Alcohol abuse, in remission: Secondary | ICD-10-CM | POA: Diagnosis not present

## 2023-08-04 DIAGNOSIS — K31A0 Gastric intestinal metaplasia, unspecified: Secondary | ICD-10-CM | POA: Diagnosis not present

## 2023-08-04 DIAGNOSIS — C155 Malignant neoplasm of lower third of esophagus: Secondary | ICD-10-CM

## 2023-08-04 NOTE — Patient Instructions (Addendum)
You have been referred to [facility].  Make certain to bring a list of current medications, including any over the counter medications or vitamins. Also bring your co-pay if you have one as well as your insurance cards. If you have not heard from their office within 1-2 weeks , please contact them at   [facility]  Address: [address] [place], [place] [address] Phone: [phone]  _______________________________________________________  If your blood pressure at your visit was 140/90 or greater, please contact your primary care physician to follow up on this.  _______________________________________________________  If you are age 43 or older, your body mass index should be between 23-30. Your There is no height or weight on file to calculate BMI. If this is out of the aforementioned range listed, please consider follow up with your Primary Care Provider.  If you are age 5 or younger, your body mass index should be between 19-25. Your There is no height or weight on file to calculate BMI. If this is out of the aformentioned range listed, please consider follow up with your Primary Care Provider.   ________________________________________________________  The [facility] providers would like to encourage you to use [facility] to communicate with providers for non-urgent requests or questions.  Due to long hold times on the telephone, sending your provider a message by [facility] may be a faster and more efficient way to get a response.  Please allow 48 business hours for a response.  Please remember that this is for non-urgent requests.  _______________________________________________________  Thank you for choosing me and [facility].  Dr. Meridee Score

## 2023-08-04 NOTE — Progress Notes (Unsigned)
GASTROENTEROLOGY OUTPATIENT CLINIC VISIT   Primary Care Provider Loyola Mast, NP [address] [place] [address] [phone]  Referring Provider Loyola Mast, NP [address] [place],  [place] [address] [phone]  Patient Profile: Daniel [name] is a 89 y.o. 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  The patient presents to the [facility] for an evaluation and management of problem(s) noted below:  Problem List No diagnosis found.   History of Present Illness This is the patient's first visit to the outpatient [facility].  Patient for the last 6 weeks to 8 weeks has been experiencing symptoms of difficulty swallowing.  At times drinking water has been difficult.  He experiences phlegm that is "coming up and choking him after refluxing".  He has had some chest discomfort as a result of this.  They have felt that this is potentially acid reflux related.  He has never actually had burning sensation or true pyrosis however.  Solid foods have been difficult to pass as well.  As a result of issues that have occurred he has lost nearly 10 pounds in the last 2 months.  Previously the patient had never undergone an upper endoscopy or had these types of symptoms before.  He had a colonoscopy more than 20 years ago.  He deals with chronic constipation.  He noticed that over the course the last year his bowel habits have changed even more.  Now he may only use the restroom once every 1 to 2 weeks.  He however does not have significant bloating or distention or pain.  Once he has not gone for a while he will take MiraLAX and stool softeners and this will help move things along.  He does not take these medications on a  regular basis.  He has not noted any blood in his stools (melena/hematochezia/maroon stools).  He previously had a history of what sounds to be atrial fibrillation during hospitalization in 2023.  His family reports being on anticoagulation for a few months and then that was stopped recently.  However patient was also on medication (possibly metoprolol) that he had been on for heart rate issues but that had also been stopped.  He has not followed up with a cardiologist as he had only seen them during his hospitalization.  He does not take nonsteroidals or BC/Goody powders on a regular basis.  GI Review of Systems Positive as above Negative for odynophagia, abdominal pain, incontinence  Review of Systems General: Denies fevers/chills HEENT: Denies oral lesions Cardiovascular: Denies current chest pain/palpitations Pulmonary: Denies current shortness of breath/cough Gastroenterological: See HPI Genitourinary: Denies darkened urine or hematuria Hematological: Denies easy bruising/bleeding Endocrine: Denies temperature intolerance Dermatological: Denies jaundice Psychological: Mood is stable  Medications Current Outpatient Medications  Medication Sig Dispense Refill   allopurinol (ZYLOPRIM) 100 MG tablet Take 100 mg by mouth every Monday, Wednesday, and Friday.     ALPRAZolam (XANAX) 0.5 MG tablet Take 0.5 mg by mouth 3 (three) times daily as needed for sleep (tremors).     amLODipine (NORVASC) 5 MG tablet Take 5 mg by mouth daily.     apixaban (ELIQUIS) 2.5 MG TABS tablet Take 1 tablet (2.5 mg total) by mouth 2 (two) times daily. 180 tablet 3   cholecalciferol (VITAMIN D) 1000 units tablet Take 1,000 Units by  mouth daily.     levothyroxine (SYNTHROID) 88 MCG tablet Take 88 mcg by mouth daily before breakfast.     metoprolol succinate (TOPROL XL) 25 MG 24 hr tablet Take 0.5 tablets (12.5 mg total) by mouth daily. 45 tablet 3   omeprazole (PRILOSEC) 40 MG capsule OPEN 1 CAPSULE AND SPRINKLE IN  APPLESAUCE AND EAT TWICE DAILY 60 capsule 0   oxyCODONE (ROXICODONE) 5 MG/5ML solution Place 5 mLs (5 mg total) into feeding tube every 8 (eight) hours. 100 mL 0   sucralfate (CARAFATE) 1 GM/10ML suspension Take 10 mLs (1 g total) by mouth 4 (four) times daily. 420 mL 12   No current facility-administered medications for this visit.    Allergies No Known Allergies  Histories Past Medical History:  Diagnosis Date   Blood transfusion    CKD (chronic kidney disease) stage 4, GFR 15-29 ml/min (HCC)    DVT (deep venous thrombosis) (HCC)    from surgery, no longer on Eliquis   Gastroesophageal cancer (HCC)    Gout    Hearing loss    Hypertension    Hypertriglyceridemia    Hypothyroid    Prediabetes    Prostate cancer [facility])    Pulmonary nodule    Tinnitus    Past Surgical History:  Procedure Laterality Date   BIOPSY  01/05/2023   Procedure: BIOPSY;  Surgeon: Lemar Lofty., MD;  Location: [facility]COPY;  Service: Gastroenterology;;   BIOPSY  05/13/2023   Procedure: BIOPSY;  Surgeon: Lemar Lofty., MD;  Location: [facility] ENDOSCOPY;  Service: Gastroenterology;;   CATARACT EXTRACTION Bilateral 2020   ESOPHAGOGASTRODUODENOSCOPY (EGD) WITH PROPOFOL N/A 01/05/2023   Procedure: ESOPHAGOGASTRODUODENOSCOPY (EGD) WITH PROPOFOL;  Surgeon: Lemar Lofty., MD;  Location: [facility];  Service: Gastroenterology;  Laterality: N/A;   ESOPHAGOGASTRODUODENOSCOPY (EGD) WITH PROPOFOL N/A 05/13/2023   Procedure: ESOPHAGOGASTRODUODENOSCOPY (EGD) WITH PROPOFOL;  Surgeon: Meridee Score Netty Starring., MD;  Location: [facility] ENDOSCOPY;  Service: Gastroenterology;  Laterality: N/A;   FOREIGN BODY REMOVAL  01/05/2023   Procedure: FOREIGN BODY REMOVAL;  Surgeon: Meridee Score Netty Starring., MD;  Location: [facility] ENDOSCOPY;  Service: Gastroenterology;;   HERNIA REPAIR Right 2010   HOT HEMOSTASIS N/A 05/13/2023   Procedure: HOT HEMOSTASIS (ARGON PLASMA COAGULATION/BICAP);  Surgeon: Lemar Lofty., MD;   Location: [facility] ENDOSCOPY;  Service: Gastroenterology;  Laterality: N/A;   IR GASTROSTOMY TUBE MOD SED  01/20/2023   IR GASTROSTOMY TUBE REMOVAL  07/08/2023   IR NASO G TUBE PLC W/FL W/RAD  01/20/2023   left inguinal hernia repair  06/29/2008   with mesh sail   PROSTATECTOMY  1998   prostate cancer   right ankle  1980   fracture   right knee  1980   torn meniscus   TOTAL KNEE ARTHROPLASTY Right 01/20/2022   Procedure: TOTAL KNEE ARTHROPLASTY;  Surgeon: Dannielle Huh, MD;  Location: [facility] ORS;  Service: Orthopedics;  Laterality: Right;   Social History   Socioeconomic History   Marital status: Widowed    Spouse name: Not on file   Number of children: 5   Years of education: Not on file   Highest education level: Not on file  Occupational History   Occupation: retired Research officer, political party  Tobacco Use   Smoking status: Former    Current packs/day: 1.00    Average packs/day: 1 pack/day for 20.0 years (20.0 ttl pk-yrs)    Types: Cigarettes   Smokeless tobacco: Not on file  Vaping Use   Vaping status: Never Used  Substance and Sexual  Activity   Alcohol use: Not Currently    Comment: used to drink moderately,   Drug use: No   Sexual activity: Not on file  Other Topics Concern   Not on file  Social History Narrative   Not on file   Social Determinants of Health   Financial Resource Strain: Low Risk  (01/30/2022)   Received from [facility]   Overall Financial Resource Strain (CARDIA)    Difficulty of Paying Living Expenses: Not very hard  Food Insecurity: No Food Insecurity (04/24/2022)   Received from [facility], [facility]   Hunger Vital Sign    Worried About Running Out of Food in the Last Year: Never true    Ran Out of Food in the Last Year: Never true  Transportation Needs: Not on file  Physical Activity: Not on file  Stress: No Stress Concern Present (01/30/2022)   Received from [facility] -  Occupational Stress Questionnaire    Feeling of Stress : Not at all  Social Connections: Unknown (04/16/2022)   Received from [facility], [facility]   Social Network    Social Network: Not on file  Intimate Partner Violence: Unknown (03/19/2022)   Received from [facility], [facility]   HITS    Physically Hurt: Not on file    Insult or Talk Down To: Not on file    Threaten Physical Harm: Not on file    Scream or Curse: Not on file   Family History  Problem Relation Age of Onset   Heart disease Mother    Heart disease Father    Cancer Sister        breast cancer   Heart disease Brother    Prostate cancer Brother    Colon cancer Neg Hx    Esophageal cancer Neg Hx    Inflammatory bowel disease Neg Hx    Liver disease Neg Hx    Pancreatic cancer Neg Hx    Rectal cancer Neg Hx    Stomach cancer Neg Hx    I have reviewed his medical, social, and family history in detail and updated the electronic medical record as necessary.    PHYSICAL EXAMINATION  There were no vitals taken for this visit. Wt Readings from Last 3 Encounters:  07/21/23 137 lb 8 oz (62.4 kg)  07/06/23 144 lb (65.3 kg)  06/25/23 143 lb 14.4 oz (65.3 kg)  GEN: NAD, elderly in appearance, nontoxic, accompanied by family PSYCH: Cooperative, without pressured speech EYE: Conjunctivae pale-pink, sclerae anicteric ENT: MMM, without oral ulcers CV: Irregularly irregular RESP: CTAB posteriorly, without wheezing GI: NABS, soft, NT/ND, without rebound or guarding, no HSM appreciated MSK/EXT: Trace bilateral pedal edema SKIN: No jaundice NEURO:  Alert & Oriented x 3, no focal deficits   REVIEW OF DATA  I reviewed the following data at the time of this encounter:  GI Procedures and Studies  Reports outside colonoscopy greater than 20 years ago with polyps then stopped screening due to guidelines  Laboratory Studies  Reviewed those in epic and care everywhere  Imaging Studies  No relevant studies to  review   ASSESSMENT  [name] is a 89 y.o. 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   The patient is seen today for evaluation and management of:  No  diagnosis found.  The patient is hemodynamically stable today.  It does look like he does have atrial fibrillation in office today but from a blood pressure standpoint he is doing well without any symptoms.  This is going to be important moving forward.  From a GI standpoint, he has pretty significant severe dysphagia symptoms.  He is unintentional weight loss is concerning as well.  A diagnostic endoscopy is recommended for this individual but I need to make sure that he is stable or healthy enough from a heart standpoint to undergo anesthesia.  In the interim as we get him scheduled for an EGD I need him to be evaluated by cardiology.  Should the patient need anticoagulation, then we will make decisions with cardiology as to how we manage at around an endoscopy which I hope to perform within the next 2 to 4 weeks.  This will have to be a hospital-based endoscopy due to patient's age and medical comorbidities and likely new diagnosis of atrial fibrillation (or paroxysmal atrial fibrillation).  In the interim I am going to start the patient on PPI therapy.  We are also going to have the patient undergo a barium swallow while we are waiting on optimization from a cardiology standpoint.  The risks and benefits of endoscopic evaluation were discussed with the patient; these include but are not limited to the risk of perforation, infection, bleeding, missed lesions, lack of diagnosis, severe illness requiring hospitalization, as well as anesthesia and sedation related illnesses.  The patient and/or family is agreeable to proceed.  From a  constipation standpoint, I think things will need to be further discussed based on what we find a reason/etiology for his symptoms from above.  I have asked the patient to initiate more fiber in his diet but as long as he is doing and feeling well, we will not need to do further workup.  Should the upper endoscopy evaluation be unremarkable he will need a CT/chest/abdomen/pelvis to be performed.  All patient questions were answered to the best of my ability, and the patient agrees to the aforementioned plan of action with follow-up as indicated.   PLAN  Laboratories as outlined below KUB to be performed today Barium swallow to be performed Omeprazole 40 mg twice daily - Capsule open and placed sprinkles in applesauce to optimize absorption Proceed with scheduling diagnostic/therapeutic EGD with dilation Urgent referral to cardiology for likely paroxysmal atrial fibrillation recurrence and anticoagulation needs   No orders of the defined types were placed in this encounter.   New Prescriptions   No medications on file   Modified Medications   No medications on file    Planned Follow Up No follow-ups on file.   Total Time in Face-to-Face and in Coordination of Care for patient including independent/personal interpretation/review of prior testing, medical history, examination, medication adjustment, communicating results with the patient directly, and documentation within the EHR is 45 minutes.   Corliss Parish, MD [facility] Office # [phone]

## 2023-08-05 ENCOUNTER — Encounter: Payer: Self-pay | Admitting: Gastroenterology

## 2023-08-05 DIAGNOSIS — K31A0 Gastric intestinal metaplasia, unspecified: Secondary | ICD-10-CM | POA: Insufficient documentation

## 2023-08-06 NOTE — Telephone Encounter (Signed)
Order has been placed for Peg placement. Spoke with IR and they will contact patient/ family to get patient scheduled. Daughter-in- law ( PJ) has been informed and will let family know that plan as well.

## 2023-08-06 NOTE — Telephone Encounter (Signed)
-----   Message from Malachy Mood sent at 08/05/2023  9:12 PM EDT ----- Regarding: RE: Need for interval PEG placement Yes, sure. I will include our dietician Lesle Reek here so she can watch his scheduled for PEG placement and schedule his nutrition consult appointments.  Terrace Arabia ----- Message ----- From: Lemar Lofty., MD Sent: 08/05/2023   5:13 PM EDT To: Malachy Mood, MD; Lucky Rathke, CMA Subject: Need for interval PEG placement                Marco Raper, PJ told me that her father-in-law has been okay with moving forward with PEG tube attempt by interventional radiology for nutrition optimization. Please place that order.  YF, Once we get the PEG in place, would your team be able to help with nutrition as they were doing previously? We are referring to [facility] to consider cryoablation to try to help with the dysphagia before we attempt stenting. This still could take multiple sessions and a few weeks so his nutrition needs to be optimized. Thanks. GM

## 2023-08-20 ENCOUNTER — Other Ambulatory Visit: Payer: Self-pay

## 2023-08-20 DIAGNOSIS — R1319 Other dysphagia: Secondary | ICD-10-CM

## 2023-08-20 DIAGNOSIS — C159 Malignant neoplasm of esophagus, unspecified: Secondary | ICD-10-CM

## 2023-08-20 NOTE — Progress Notes (Signed)
Refeeding lab orders placed and appt request sent to Scheduling for lab appts.  ---- Message -----  From: Lemar Lofty., MD  Sent: 08/20/2023  10:20 AM EDT  To: Anabel Bene, RD; Malachy Mood, MD; *  Subject: RE: Labs needed                                Rovonda,  Can you reach out to patient's daughter and help coordinate the following labs be drawn for his upcoming G-tube placement?  Thanks.  GM  ----- Message -----  From: Anabel Bene, RD  Sent: 08/20/2023  10:17 AM EDT  To: Malachy Mood, MD; Lemar Lofty., MD  Subject: Labs needed                                    Good morning,   In preparation for patient's G-tube placement next week, I request the following:   CMP, Phos, and Mag day of placement (Sept 11)  CMP, Phos and Mag 2 times a week to check for refeeding syndrome (Sept 16, 19, 23, 26)   Begin 100 mg Thiamine on July 11 for 5 days.   Happy to write TF orders and follow for tolerance. Let me know who will sign off on orders and who will be ordering labs and order repletion of electrolytes if needed.   Thanks!  Vernell Leep, RD

## 2023-08-21 ENCOUNTER — Telehealth: Payer: Self-pay

## 2023-08-21 ENCOUNTER — Telehealth: Payer: Self-pay | Admitting: Hematology

## 2023-08-21 DIAGNOSIS — C155 Malignant neoplasm of lower third of esophagus: Secondary | ICD-10-CM

## 2023-08-21 NOTE — Telephone Encounter (Signed)
-----   Message from [facility] sent at 08/20/2023  4:09 PM EDT ----- Regarding: RE: Labs needed SPM, Sounds good.  Ziyan Schoon, They will need to do the initial labs as outlined on the dates so if the patient could come in that day to have them drawn at the [facility] office or drawn right before that would be great. Thanks. GM ----- Message ----- From: Arcelia Jew, RN Sent: 08/20/2023   1:19 PM EDT To: Anabel Bene, RD; Lemar Lofty., MD Subject: RE: Labs needed                                Dr. Meridee Score,  Our office will do all the weekly refeeding labs if you could just draw the initial labs needed the day of the G-Tube placement.  Thanks,  Webb Silversmith, BSN RN ----- Message ----- From: Anabel Bene, RD Sent: 08/20/2023  12:57 PM EDT To: Arcelia Jew, RN Subject: FW: Labs needed                                 ----- Message ----- From: Lemar Lofty., MD Sent: 08/20/2023  10:20 AM EDT To: Anabel Bene, RD; Malachy Mood, MD; # Subject: RE: Labs needed                                Brentt Fread, Can you reach out to patient's daughter and help coordinate the following labs be drawn for his upcoming G-tube placement? Thanks. GM ----- Message ----- From: Anabel Bene, RD Sent: 08/20/2023  10:17 AM EDT To: Malachy Mood, MD; Lemar Lofty., MD Subject: Labs needed                                    Good morning,  In preparation for patient's G-tube placement next week, I request the following:  CMP, Phos, and Mag day of placement (Sept 11) CMP, Phos and Mag 2 times a week to check for refeeding syndrome (Sept 16, 19, 23, 26)  Begin 100 mg Thiamine on July 11 for 5 days.  Happy to write TF orders and follow for tolerance. Let me know who will sign off on orders and who will be ordering labs and order repletion of electrolytes if needed.  Thanks! Vernell Leep, RD

## 2023-08-21 NOTE — Telephone Encounter (Signed)
Labs have been entered for our lab. Patients daughter has been informed.

## 2023-08-24 ENCOUNTER — Encounter: Payer: Self-pay | Admitting: Gastroenterology

## 2023-08-24 NOTE — Telephone Encounter (Signed)
I have sent a message to my colleagues/friends back at [facility] to see if anyone is doing cryoablation. We are still in the process here of getting the cryoablation so I am not sure when that will be available to see if I could use it. I can certainly entertain an endoscopy to just see if things have progressed significantly where I may end up needing to do a stent, but if I do not have the cryoablation it would just be for diagnostic purposes.  Daniel [name], You have spoken with the patient's daughter previously; please call and see if she wants to find out if they want to have me just go ahead and do an endoscopy to get an understanding of how things have progressed over the last few months so I can have that information and then give that out to the [facility] team since it is out that far. Thanks. GM

## 2023-08-25 ENCOUNTER — Other Ambulatory Visit: Payer: Self-pay | Admitting: Radiology

## 2023-08-25 DIAGNOSIS — C159 Malignant neoplasm of esophagus, unspecified: Secondary | ICD-10-CM

## 2023-08-25 NOTE — Telephone Encounter (Signed)
Thanks for all your help. I'll let you and them know if we get the Cryoablation here before their appointment. For now, the G-tube will be most important for nutrition. Thanks. GM

## 2023-08-25 NOTE — H&P (Incomplete)
Referring Physician(s): Mansouraty,Gabriel Jr./Feng,Y  Supervising Physician: Mugweru,J  Patient Status:  [facility]  Chief Complaint:  " I am here for another stomach tube"  Subjective: Patient known to IR service from balloon retention gastrostomy tube placement on 01/20/2023 and removal on 07/08/2023.  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  Patient continues to have persistent dysphagia/abdominal pain with eating and recent esophagram which showed irregular segmental narrowing of the distal esophagus compatible with known esophageal carcinoma along with dysmotility but no leak.  He presents today for gastrostomy tube replacement. He denies fever,HA,worsening dyspnea or bleeding. He does have epigastric discomfort, occ nausea, back pain, weight loss, hearing loss.    Past Medical History:  Diagnosis Date   Blood transfusion    CKD (chronic kidney disease) stage 4, GFR 15-29 ml/min (HCC)    DVT (deep venous thrombosis) (HCC)    from surgery, no longer on Eliquis   Gastroesophageal cancer (HCC)    Gout    Hearing loss    Hypertension    Hypertriglyceridemia    Hypothyroid    Prediabetes    Prostate cancer [facility])    Pulmonary nodule    Tinnitus    Past Surgical History:  Procedure Laterality Date   BIOPSY  01/05/2023   Procedure: BIOPSY;  Surgeon: Lemar Lofty., MD;  Location: [facility] ENDOSCOPY;  Service: Gastroenterology;;   BIOPSY  05/13/2023   Procedure: BIOPSY;  Surgeon: Lemar Lofty., MD;  Location: [facility] ENDOSCOPY;  Service: Gastroenterology;;   CATARACT EXTRACTION Bilateral 2020   ESOPHAGOGASTRODUODENOSCOPY (EGD)  WITH PROPOFOL N/A 01/05/2023   Procedure: ESOPHAGOGASTRODUODENOSCOPY (EGD) WITH PROPOFOL;  Surgeon: Lemar Lofty., MD;  Location: [facility] ENDOSCOPY;  Service: Gastroenterology;  Laterality: N/A;   ESOPHAGOGASTRODUODENOSCOPY (EGD) WITH PROPOFOL N/A 05/13/2023   Procedure: ESOPHAGOGASTRODUODENOSCOPY (EGD) WITH PROPOFOL;  Surgeon: Meridee Score Netty Starring., MD;  Location: [facility] ENDOSCOPY;  Service: Gastroenterology;  Laterality: N/A;   FOREIGN BODY REMOVAL  01/05/2023   Procedure: FOREIGN BODY REMOVAL;  Surgeon: Meridee Score Netty Starring., MD;  Location: [facility] ENDOSCOPY;  Service: Gastroenterology;;   HERNIA REPAIR Right 2010   HOT HEMOSTASIS N/A 05/13/2023   Procedure: HOT HEMOSTASIS (ARGON PLASMA COAGULATION/BICAP);  Surgeon: Lemar Lofty., MD;  Location: [facility] ENDOSCOPY;  Service: Gastroenterology;  Laterality: N/A;   IR GASTROSTOMY TUBE MOD SED  01/20/2023   IR GASTROSTOMY TUBE REMOVAL  07/08/2023   IR NASO G TUBE PLC W/FL W/RAD  01/20/2023   left inguinal hernia repair  06/29/2008   with mesh sail   PROSTATECTOMY  1998   prostate cancer   right ankle  1980   fracture   right knee  1980   torn meniscus   TOTAL KNEE ARTHROPLASTY Right 01/20/2022   Procedure: TOTAL KNEE ARTHROPLASTY;  Surgeon: Dannielle Huh, MD;  Location: [facility] ORS;  Service: Orthopedics;  Laterality: Right;      Allergies: Patient has no known allergies.  Medications: Prior to Admission medications   Medication Sig Start Date End Date Taking? Authorizing Provider  allopurinol (ZYLOPRIM) 100 MG tablet Take 100 mg by mouth every Monday, Wednesday, and Friday. 12/02/21   [provider]  ALPRAZolam Prudy Feeler) 0.5 MG tablet Take 0.5 mg by mouth 3 (three) times daily as needed for sleep (tremors).    [provider]  amLODipine (NORVASC) 5 MG tablet Take 5 mg by mouth daily. 12/02/21   [provider]  apixaban (ELIQUIS) 2.5 MG TABS tablet Take 1 tablet (2.5 mg total) by mouth 2 (two) times daily. 05/15/23    Mansouraty, Netty Starring., MD  cholecalciferol (VITAMIN D) 1000 units tablet Take 1,000 Units by mouth daily.    [provider]  levothyroxine (SYNTHROID) 88 MCG tablet Take 88 mcg by mouth daily before breakfast. 08/17/18   [provider]  metoprolol succinate (TOPROL XL) 25 MG 24 hr tablet Take 0.5 tablets (12.5 mg total) by mouth daily. 12/24/22   Tobb, Kardie, DO  omeprazole (PRILOSEC) 40 MG capsule OPEN 1 CAPSULE AND SPRINKLE IN APPLESAUCE AND EAT TWICE DAILY 07/08/23   Mansouraty, Netty Starring., MD  oxyCODONE (ROXICODONE) 5 MG/5ML solution Place 5 mLs (5 mg total) into feeding tube every 8 (eight) hours. 01/20/23   Sitara Cashwell K, PA-C  sucralfate (CARAFATE) 1 GM/10ML suspension Take 10 mLs (1 g total) by mouth 4 (four) times daily. 05/13/23 05/12/24  Mansouraty, Netty Starring., MD     Vital Signs: Vitals:   08/26/23 0943  BP: 133/75  Pulse: (!) 125  Resp: 17  Temp: 98 F (36.7 C)  SpO2: (!) 88%       Code Status: FULL CODE  Physical Exam: awake/alert; chest- sl dim BS bases; heart- nl rate (71), ectopy noted; abd- soft,+BS,some epigastric tenderness to palpation; left>right LE edema  Imaging: No results found.  Labs:  CBC: Recent Labs    03/27/23 0844 04/24/23 0939 06/25/23 0948 07/06/23 1203  WBC 6.4 6.4 7.7 7.7  HGB 12.0* 12.1* 12.8* 12.2*  HCT 35.5* 35.3* 37.3* 36.1*  PLT 151 188 214 245    COAGS: Recent Labs    12/23/22 1036 01/20/23 0833  INR 1.0 1.1    BMP: Recent Labs    03/27/23 0844 04/24/23 0939 06/25/23 0948 07/06/23 1203  NA 140 140 140 139  K 5.1 4.7 4.2 4.7  CL 107 107 107 107  CO2 29 28 26 24   GLUCOSE 128* 158* 128* 97  BUN 45* 47* 37* 36*  CALCIUM 8.9 8.7* 9.0 8.8*  CREATININE 1.77* 2.00* 2.20* 2.16*  GFRNONAA 35* 30* 27* 28*    LIVER FUNCTION TESTS: Recent Labs    03/27/23 0844 04/24/23 0939 06/25/23 0948 07/06/23 1203  BILITOT 0.6 0.6 0.7 1.0  AST 39 23 22 24   ALT 25 10 11 12   ALKPHOS 61 63 65 64  PROT  5.6* 5.8* 6.1* 6.0*  ALBUMIN 3.3* 3.3* 3.4* 3.3*    Assessment and Plan: 89 year old male with past medical history significant for chronic kidney disease, paroxysmal atrial fibrillation, 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  He underwent prior balloon retention gastrostomy tube on 01/20/2023 with removal on 07/08/2023.  Patient continues to have persistent dysphagia/abdominal pain with eating and recent esophagram which showed irregular segmental narrowing of the distal esophagus compatible with known esophageal carcinoma along with dysmotility but no leak.  He presents today for gastrostomy tube replacement.Risks and benefits image guided gastrostomy tube placement was discussed with the patient /daughter including, but not limited to the need for a barium enema during the procedure, bleeding, infection, peritonitis and/or damage to adjacent structures.  All of the patient's questions were answered, patient is agreeable to proceed.  Consent signed and in chart.    Electronically  Signed: D. Jeananne Rama, PA-C 08/25/2023, 11:46 AM   I spent a total of 20 minutes at the the patient's bedside AND on the patient's hospital floor or unit, greater than 50% of which was counseling/coordinating care for percutaneous gastrostomy tube placement

## 2023-08-26 ENCOUNTER — Other Ambulatory Visit: Payer: Self-pay

## 2023-08-26 ENCOUNTER — Ambulatory Visit (HOSPITAL_COMMUNITY)
Admission: RE | Admit: 2023-08-26 | Discharge: 2023-08-26 | Disposition: A | Discharge status: Home / Self Care | Payer: Medicare Other | Source: Ambulatory Visit | Attending: Gastroenterology | Admitting: Gastroenterology

## 2023-08-26 ENCOUNTER — Other Ambulatory Visit (INDEPENDENT_AMBULATORY_CARE_PROVIDER_SITE_OTHER): Payer: Medicare Other

## 2023-08-26 ENCOUNTER — Other Ambulatory Visit: Payer: Self-pay | Admitting: Gastroenterology

## 2023-08-26 ENCOUNTER — Encounter (HOSPITAL_COMMUNITY): Payer: Self-pay

## 2023-08-26 DIAGNOSIS — E781 Pure hyperglyceridemia: Secondary | ICD-10-CM | POA: Diagnosis not present

## 2023-08-26 DIAGNOSIS — R1319 Other dysphagia: Secondary | ICD-10-CM | POA: Insufficient documentation

## 2023-08-26 DIAGNOSIS — N184 Chronic kidney disease, stage 4 (severe): Secondary | ICD-10-CM | POA: Diagnosis not present

## 2023-08-26 DIAGNOSIS — I129 Hypertensive chronic kidney disease with stage 1 through stage 4 chronic kidney disease, or unspecified chronic kidney disease: Secondary | ICD-10-CM | POA: Diagnosis not present

## 2023-08-26 DIAGNOSIS — Z8546 Personal history of malignant neoplasm of prostate: Secondary | ICD-10-CM | POA: Diagnosis not present

## 2023-08-26 DIAGNOSIS — C155 Malignant neoplasm of lower third of esophagus: Secondary | ICD-10-CM

## 2023-08-26 DIAGNOSIS — E039 Hypothyroidism, unspecified: Secondary | ICD-10-CM | POA: Insufficient documentation

## 2023-08-26 DIAGNOSIS — Z86718 Personal history of other venous thrombosis and embolism: Secondary | ICD-10-CM | POA: Diagnosis not present

## 2023-08-26 DIAGNOSIS — I480 Paroxysmal atrial fibrillation: Secondary | ICD-10-CM | POA: Insufficient documentation

## 2023-08-26 DIAGNOSIS — M109 Gout, unspecified: Secondary | ICD-10-CM | POA: Insufficient documentation

## 2023-08-26 DIAGNOSIS — R7303 Prediabetes: Secondary | ICD-10-CM | POA: Diagnosis not present

## 2023-08-26 DIAGNOSIS — C159 Malignant neoplasm of esophagus, unspecified: Secondary | ICD-10-CM

## 2023-08-26 HISTORY — PX: IR GASTROSTOMY TUBE MOD SED: IMG625

## 2023-08-26 HISTORY — PX: IR NASO G TUBE PLC W/FL W/RAD: IMG2321

## 2023-08-26 LAB — CBC WITH DIFFERENTIAL/PLATELET
Abs Immature Granulocytes: 0.01 10*3/uL (ref 0.00–0.07)
Basophils Absolute: 0 10*3/uL (ref 0.0–0.1)
Basophils Relative: 1 %
Eosinophils Absolute: 0.1 10*3/uL (ref 0.0–0.5)
Eosinophils Relative: 2 %
HCT: 36.2 % — ABNORMAL LOW (ref 39.0–52.0)
Hemoglobin: 12.2 g/dL — ABNORMAL LOW (ref 13.0–17.0)
Immature Granulocytes: 0 %
Lymphocytes Relative: 16 %
Lymphs Abs: 0.9 10*3/uL (ref 0.7–4.0)
MCH: 35.7 pg — ABNORMAL HIGH (ref 26.0–34.0)
MCHC: 33.7 g/dL (ref 30.0–36.0)
MCV: 105.8 fL — ABNORMAL HIGH (ref 80.0–100.0)
Monocytes Absolute: 0.4 10*3/uL (ref 0.1–1.0)
Monocytes Relative: 6 %
Neutro Abs: 4.4 10*3/uL (ref 1.7–7.7)
Neutrophils Relative %: 75 %
Platelets: 216 10*3/uL (ref 150–400)
RBC: 3.42 MIL/uL — ABNORMAL LOW (ref 4.22–5.81)
RDW: 13.5 % (ref 11.5–15.5)
WBC: 5.8 10*3/uL (ref 4.0–10.5)
nRBC: 0 % (ref 0.0–0.2)

## 2023-08-26 LAB — PHOSPHORUS
Phosphorus: 3.3 mg/dL (ref 2.3–4.6)
Phosphorus: 3.5 mg/dL (ref 2.5–4.6)

## 2023-08-26 LAB — COMPREHENSIVE METABOLIC PANEL
ALT: 8 U/L (ref 0–53)
ALT: 12 U/L (ref 0–44)
AST: 17 U/L (ref 0–37)
AST: 19 U/L (ref 15–41)
Albumin: 3.4 g/dL — ABNORMAL LOW (ref 3.5–5.2)
Albumin: 3.4 g/dL — ABNORMAL LOW (ref 3.5–5.0)
Alkaline Phosphatase: 63 U/L (ref 39–117)
Alkaline Phosphatase: 59 U/L (ref 38–126)
Anion gap: 8 (ref 5–15)
BUN: 28 mg/dL — ABNORMAL HIGH (ref 6–23)
BUN: 30 mg/dL — ABNORMAL HIGH (ref 8–23)
CO2: 26 meq/L (ref 19–32)
CO2: 27 mmol/L (ref 22–32)
Calcium: 9 mg/dL (ref 8.4–10.5)
Calcium: 8.9 mg/dL (ref 8.9–10.3)
Chloride: 106 meq/L (ref 96–112)
Chloride: 105 mmol/L (ref 98–111)
Creatinine, Ser: 1.65 mg/dL — ABNORMAL HIGH (ref 0.40–1.50)
Creatinine, Ser: 1.7 mg/dL — ABNORMAL HIGH (ref 0.61–1.24)
GFR, Estimated: 37 mL/min — ABNORMAL LOW (ref >60)
GFR: 35.29 mL/min — ABNORMAL LOW (ref >60.00)
Glucose, Bld: 109 mg/dL — ABNORMAL HIGH (ref 70–99)
Glucose, Bld: 108 mg/dL — ABNORMAL HIGH (ref 70–99)
Potassium: 4.1 meq/L (ref 3.5–5.1)
Potassium: 4.8 mmol/L (ref 3.5–5.1)
Sodium: 141 meq/L (ref 135–145)
Sodium: 140 mmol/L (ref 135–145)
Total Bilirubin: 1 mg/dL (ref 0.2–1.2)
Total Bilirubin: 1 mg/dL (ref 0.3–1.2)
Total Protein: 5.8 g/dL — ABNORMAL LOW (ref 6.0–8.3)
Total Protein: 6.1 g/dL — ABNORMAL LOW (ref 6.5–8.1)

## 2023-08-26 LAB — PROTIME-INR
INR: 1.3 — ABNORMAL HIGH (ref 0.8–1.2)
Prothrombin Time: 16.2 s — ABNORMAL HIGH (ref 11.4–15.2)

## 2023-08-26 LAB — MAGNESIUM
Magnesium: 1.8 mg/dL (ref 1.5–2.5)
Magnesium: 2.1 mg/dL (ref 1.7–2.4)

## 2023-08-26 MED ORDER — IOHEXOL 300 MG/ML  SOLN
50.0000 mL | Freq: Once | INTRAMUSCULAR | Status: AC | PRN
  Administered 2023-08-26: 10 mL

## 2023-08-26 MED ORDER — CEFAZOLIN SODIUM-DEXTROSE 2-4 GM/100ML-% IV SOLN
2.0000 g | INTRAVENOUS | Status: DC
  Filled 2023-08-26: qty 100

## 2023-08-26 MED ORDER — LIDOCAINE HCL 1 % IJ SOLN
INTRAMUSCULAR | Status: AC
  Filled 2023-08-26: qty 20

## 2023-08-26 MED ORDER — MIDAZOLAM HCL 2 MG/2ML IJ SOLN
INTRAMUSCULAR | Status: AC | PRN
Start: 2023-08-26 — End: 2023-08-26
  Administered 2023-08-26 (×2): .5 mg via INTRAVENOUS

## 2023-08-26 MED ORDER — LIDOCAINE HCL 1 % IJ SOLN
20.0000 mL | Freq: Once | INTRAMUSCULAR | Status: AC
  Administered 2023-08-26: 20 mL via INTRADERMAL

## 2023-08-26 MED ORDER — FENTANYL CITRATE (PF) 100 MCG/2ML IJ SOLN
INTRAMUSCULAR | Status: AC
  Filled 2023-08-26: qty 2

## 2023-08-26 MED ORDER — CEFAZOLIN SODIUM-DEXTROSE 2-4 GM/100ML-% IV SOLN
INTRAVENOUS | Status: AC | PRN
  Administered 2023-08-26: 2 g via INTRAVENOUS

## 2023-08-26 MED ORDER — FENTANYL CITRATE (PF) 100 MCG/2ML IJ SOLN
INTRAMUSCULAR | Status: AC | PRN
Start: 2023-08-26 — End: 2023-08-26
  Administered 2023-08-26 (×3): 25 ug via INTRAVENOUS

## 2023-08-26 MED ORDER — GLUCAGON HCL (RDNA) 1 MG IJ SOLR
INTRAMUSCULAR | Status: AC | PRN
Start: 2023-08-26 — End: 2023-08-26
  Administered 2023-08-26 (×2): .5 mg via INTRAVENOUS

## 2023-08-26 MED ORDER — SODIUM CHLORIDE 0.9 % IV SOLN: INTRAVENOUS | Status: DC

## 2023-08-26 MED ORDER — MIDAZOLAM HCL 2 MG/2ML IJ SOLN
INTRAMUSCULAR | Status: AC
  Filled 2023-08-26: qty 2

## 2023-08-26 MED ORDER — GLUCAGON HCL RDNA (DIAGNOSTIC) 1 MG IJ SOLR
INTRAMUSCULAR | Status: AC
  Filled 2023-08-26: qty 1

## 2023-08-26 NOTE — Progress Notes (Signed)
   08/26/23 1159  Arrived From  Arrived From Radiology  Mode of Transport Strecher

## 2023-08-26 NOTE — Procedures (Signed)
Vascular and Interventional Radiology Procedure Note  Patient: Daniel [name] DOB: 1927-06-10 Medical Record Number: [id number] Note Date/Time: 08/26/23 11:05 AM   Performing Physician: Roanna Banning, MD Assistant(s): None  Diagnosis: Head and Neck CA. Prior G-tube, needs replaced.  Procedure: PERCUTANEOUS GASTROSTOMY TUBE PLACEMENT  Anesthesia: Conscious Sedation Complications: None Estimated Blood Loss: Minimal  Findings:  Successful placement of a 60F gastrostomy tube under fluoroscopy.   Plan: G-tube to gravity drainage bag x 2 hrs Liquid diet x 24 hrs OK to cap G-tube for 2 hours post liquid meal. OK for meds per tube post procedure. OK to begin TFs and G-tube use in 4 hrs, taper from trickle to goal as tolerated.   Pt is to return to VIR for routine G-tube exchange in 6 months.   See detailed procedure note with images in PACS. The patient tolerated the procedure well without incident or complication and was returned to Recovery in stable condition.    Roanna Banning, MD [facility] Radiology   Pager. [phone] Clinic. [phone]

## 2023-08-26 NOTE — Discharge Instructions (Addendum)
Discharge Instructions:  Radiology clinic [phone] with any questions or concerns.   You may remove your dressing and do a sponge bath tomorrow.  Keep the gastrostomy site covered for the first week then as instructed. Starting 08-28-23 Daily for 7 days:    - Cleanse site gastrostomy with soap and water, dress with triple-antibiotic ointment, and cover with gauze   Do not use scissors near the catheter Please call Interventional   Cap the gastrostomy port. May start using gastrostomy tube according to instruction from oncologist. Keep head of bed elevated 30 degree or higher. Flush gastrostomy tube with 10 cc of water after receiving medication daily or twice daily.        Gastrostomy Tube Home Guide, Adult A gastrostomy tube, or G-tube, is a tube that is inserted through the abdomen into the stomach. The tube is used to give feedings and medicines when a person cannot eat and drink enough on his or her own or take medicines by mouth. How to care for the insertion site Washing hands with soap and water.   Supplies needed: Saline solution or clean, warm water and soap. Saline solution is made of salt and water. Cotton swab or gauze. Pre-cut gauze bandage (dressing) and tape, if needed. Instructions Follow these steps daily to clean the insertion site: Wash your hands with soap and water for at least 20 seconds. Remove the dressing (if there is one) that is between the person's skin and the tube. Check the area where the tube enters the skin. Check daily for problems such as: Redness, rash, or irritation. Swelling. Pus-like drainage. Extra skin growth. Moisten the cotton swab or gauze with the saline solution or with a soap-and-water mixture. Gently clean around the insertion site. Remove any drainage or crusted material. When the G-tube is first put in, a normal saline solution or water can be used to clean the skin. After the skin around the tube has healed, mild soap and  water may be used. Apply a dressing (if there should be one) between the person's skin and the tube. How to flush a G-tube Flush the G-tube regularly to keep it from clogging. Flush it before and after feedings and as often as told by the health care provider. Supplies needed: Purified or germ-free (sterile) water, warmed. Container with lid for boiling water, if needed. 60 cc G-tube syringe. Instructions Before you begin, decide whether to use sterile water or purified drinking water. Use only sterile water if: The person has a weak disease-fighting (immune) system. The person has trouble fighting off infections (is immunocompromised). You are unsure about the amount of chemical contaminants in purified or drinking water. Use purified drinking water in all other cases. To purify drinking water by boiling: Boil water for at least 1 minute. Keep lid over water while it boils. Let water cool to room temperature before using. Follow these steps to flush the G-tube: Wash your hands with soap and water for at least 20 seconds. Bring out (draw up) 30 mL of warm water in a syringe. Connect the syringe to the tube. Slowly and gently push the water into the tube. General tips If the tube comes out: Cover the opening with a clean dressing and tape. Get help right away. If there is skin or scar tissue growing where the tube enters the skin: Keep the area clean and dry. Secure the tube with tape so that the tube does not move around too much. If the tube gets clogged: Slowly push  warm water into the tube with a large syringe. Do not force the fluid into the tube or push an object into the tube. Get help right away if you cannot unclog the tube. Follow these instructions at home: Feedings Give feedings at room temperature. If feedings are continuous: Do not put more than 4 hours' worth of feedings in the feeding bag. Stop the feedings when you need to give medicine or flush the tube. Be sure  to restart the feedings. Make sure the person's head is above his or her stomach (upright position). This will prevent choking and discomfort. Make sure the person is in the right position during and after feedings. During feedings, have the person in the upright position. After a non-continuous feeding (bolus feeding), have the person stay in the upright position for 1 hour. Cover and place unused feedings in the refrigerator. Replace feeding bags and syringes as told. Good hygiene Make sure the person takes good care of his or her mouth and teeth (oral hygiene), such as by brushing his or her teeth. Keep the area where the tube enters the skin clean and dry. General instructions Do not pull or put tension on the tube. Before you remove the tube cap or disconnect a syringe, close the tube by using a clamp (clamping) or bending (kinking) the tube. Measure the length of the G-tube every day from the insertion site to the end of the tube. If the person's G-tube has a balloon, check the fluid in the balloon every week. Check the manufacturer's specifications to find the amount of fluid that should be in the balloon. Remove excess air from the G-tube as told. This is called venting. Do not push feedings, medicines, or flushes fast. Use the feeding tube equipment, such as syringes and connectors, only as told by your health care provider. Contact a health care provider if: The person with the tube has constipation or a fever. A large amount of fluid or mucus-like liquid is leaking from the tube. Skin or scar tissue appears to be growing where the tube enters the skin. The length of tube from the insertion site to the G-tube gets longer. Get help right away if: The person with the tube has any of these problems: Severe pain, tenderness, or bloating in the abdomen. Nausea or vomiting. Trouble breathing or shortness of breath. Any of these problems happen in the area where the tube enters the  skin: Redness, irritation, swelling, or soreness. Pus-like discharge. A bad smell. The tube is clogged and cannot be flushed. The tube comes out. The tube will need to be put back in within 4 hours. Summary A gastrostomy tube, or G-tube, is a tube that is inserted through the abdomen into the stomach. The tube is used to give feedings and medicines when a person cannot eat and drink enough on his or her own or cannot take medicine by mouth. Check and clean the insertion site daily as told by the person's health care provider. Flush the G-tube regularly to keep it from clogging. Flush it before and after feedings and as often as told. Keep the area where the tube enters the skin clean and dry. This information is not intended to replace advice given to you by your health care provider. Make sure you discuss any questions you have with your health care provider. Document Revised: 11/21/2020 Document Reviewed: 04/19/2020 Elsevier Patient Education  2023 Else[employer] Inc.    Moderate Conscious Sedation, Adult, Care After This sheet gives you  information about how to care for yourself after your procedure. Your health care provider may also give you more specific instructions. If you have problems or questions, contact your health care provider. What can I expect after the procedure? After the procedure, it is common to have: Sleepiness for several hours. Impaired judgment for several hours. Difficulty with balance. Vomiting if you eat too soon. Follow these instructions at home: For the time period you were told by your health care provider: Rest. Do not participate in activities where you could fall or become injured. Do not drive or use machinery. Do not drink alcohol. Do not take sleeping pills or medicines that cause drowsiness. Do not make important decisions or sign legal documents. Do not take care of children on your own. Eating and drinking  Follow the diet recommended by your  health care provider. Drink enough fluid to keep your urine pale yellow. If you vomit: Drink water, juice, or soup when you can drink without vomiting. Make sure you have little or no nausea before eating solid foods. General instructions Take over-the-counter and prescription medicines only as told by your health care provider. Have a responsible adult stay with you for the time you are told. It is important to have someone help care for you until you are awake and alert. Do not smoke. Keep all follow-up visits as told by your health care provider. This is important. Contact a health care provider if: You are still sleepy or having trouble with balance after 24 hours. You feel light-headed. You keep feeling nauseous or you keep vomiting. You develop a rash. You have a fever. You have redness or swelling around the IV site. Get help right away if: You have trouble breathing. You have new-onset confusion at home. Summary After the procedure, it is common to feel sleepy, have impaired judgment, or feel nauseous if you eat too soon. Rest after you get home. Know the things you should not do after the procedure. Follow the diet recommended by your health care provider and drink enough fluid to keep your urine pale yellow. Get help right away if you have trouble breathing or new-onset confusion at home. This information is not intended to replace advice given to you by your health care provider. Make sure you discuss any questions you have with your health care provider. Document Revised: 03/30/2020 Document Reviewed: 10/27/2019 Elsevier Patient Education  2023 [employer].

## 2023-08-27 ENCOUNTER — Inpatient Hospital Stay: Payer: Medicare Other | Attending: Physician Assistant | Admitting: Nutrition

## 2023-08-27 ENCOUNTER — Other Ambulatory Visit: Payer: Self-pay | Admitting: Nutrition

## 2023-08-27 DIAGNOSIS — C155 Malignant neoplasm of lower third of esophagus: Secondary | ICD-10-CM | POA: Insufficient documentation

## 2023-08-27 DIAGNOSIS — N184 Chronic kidney disease, stage 4 (severe): Secondary | ICD-10-CM | POA: Insufficient documentation

## 2023-08-27 DIAGNOSIS — C7951 Secondary malignant neoplasm of bone: Secondary | ICD-10-CM | POA: Insufficient documentation

## 2023-08-27 MED ORDER — KATE FARMS STANDARD 1.4 EN LIQD: ENTERAL | Status: DC

## 2023-08-27 NOTE — Progress Notes (Signed)
89 year old male with history of esophageal cancer now with progressive dysphagia.  He is followed by Dr. Mosetta Putt.  Patient status post palliative radiation therapy.  No plans at this time for further treatment.  Nutrition follow-up was completed by phone per family request.  Patient had his G-tube removed in July 2024 at which time he weighed 144 pounds.  Patient's status post 69 French G-tube replacement on September 11.  Current weight documented as 132 pounds.  Past medical history includes chronic kidney disease stage IV, gout, hypertension, hypothyroidism, prediabetes, prostate cancer.  Medications include Xanax, vitamin D, Synthroid, Prilosec, Carafate.  Labs on September 11 include magnesium 2.1, phosphorus 3.5, potassium 4.8, glucose 108, BUN 30, creatinine 1.7, and albumin 3.4.  Height: 5 feet 5 and half inches. Weight: 132 pounds. Patient weighed 153 pounds January 20, 2022. BMI: 21.62. ECOG: 1.  Estimated nutrition needs: 1600-1900 cal, 75-85 g protein, greater than 1.6 L fluid.  Daughter reports patient unable to consume adequate nutrition by mouth.  He has taken a few bites of applesauce.  He has 8% weight loss in less than 3 months since tube removed.  He is status post new G-tube, ready to use.  Plan for 100% estimated nutrition needs via feeding tube.  Daughter feels very comfortable with administration formula.  Patient denies pain or discomfort at the site of the tube placement and both confirm there has been no leaking.  Nutrition diagnosis:  Inadequate oral intake related to dysphagia and esophageal cancer as evidenced by percent weight loss and inability to consume adequate nutrition.  Intervention: 1 carton [employer] 1.4 via G-tube 4 times daily with 60 mL of free water before and after each bolus feeding.  Patient will start with one half carton and progress slowly to goal rate. Provide an additional 300 mL free water 3 times daily between bolus feedings. P.O. intake  per MD. Order written and sent to [facility] home infusion. Daughter educated.  Questions answered.  Contact information given.  Monitoring, evaluation, goals: Patient will tolerate adequate tube feeding advancement without showing any signs of refeeding syndrome to meet greater than 90% estimated nutrition needs.  Next visit: Tuesday, September 17 after MD visit.

## 2023-08-27 NOTE — Progress Notes (Deleted)
Nutrition Assessment  Reason for Assessment: No chief complaint on file.    ASSESSMENT: ***   NUTRITION - FOCUSED PHYSICAL EXAM:    MEDICATIONS: ***   LABS: ***   ANTHROPOMETRICS: Height:   Ht Readings from Last 1 Encounters:  08/26/23 5' 5.5" (1.664 m)   Weight:  Wt Readings from Last 1 Encounters:  08/26/23 132 lb (59.9 kg)   BMI:   BMI Readings from Last 1 Encounters:  08/26/23 21.63 kg/m   UBW:  No data recorded IBW:  No data recorded   ESTIMATED ENERGY NEEDS:  Kcal:    Protein:   Fluid:      NUTRITION DIAGNOSIS:   No data recorded related to No data recorded as evidenced by No data recorded.   DOCUMENTATION CODES:      INTERVENTION:     GOAL:      MONITOR:      Next Visit: ***   Rip Hawes, RD

## 2023-08-28 ENCOUNTER — Encounter: Payer: Self-pay | Admitting: Hematology

## 2023-08-31 ENCOUNTER — Inpatient Hospital Stay: Payer: Medicare Other

## 2023-08-31 DIAGNOSIS — C159 Malignant neoplasm of esophagus, unspecified: Secondary | ICD-10-CM

## 2023-08-31 DIAGNOSIS — C7951 Secondary malignant neoplasm of bone: Secondary | ICD-10-CM | POA: Diagnosis not present

## 2023-08-31 DIAGNOSIS — C155 Malignant neoplasm of lower third of esophagus: Secondary | ICD-10-CM | POA: Diagnosis present

## 2023-08-31 DIAGNOSIS — R1319 Other dysphagia: Secondary | ICD-10-CM

## 2023-08-31 DIAGNOSIS — N184 Chronic kidney disease, stage 4 (severe): Secondary | ICD-10-CM | POA: Diagnosis not present

## 2023-08-31 LAB — CMP (CANCER CENTER ONLY)
ALT: 5 U/L (ref 0–44)
AST: 18 U/L (ref 15–41)
Albumin: 3.4 g/dL — ABNORMAL LOW (ref 3.5–5.0)
Alkaline Phosphatase: 69 U/L (ref 38–126)
Anion gap: 4 — ABNORMAL LOW (ref 5–15)
BUN: 36 mg/dL — ABNORMAL HIGH (ref 8–23)
CO2: 29 mmol/L (ref 22–32)
Calcium: 8.8 mg/dL — ABNORMAL LOW (ref 8.9–10.3)
Chloride: 105 mmol/L (ref 98–111)
Creatinine: 1.72 mg/dL — ABNORMAL HIGH (ref 0.61–1.24)
GFR, Estimated: 36 mL/min — ABNORMAL LOW (ref >60)
Glucose, Bld: 135 mg/dL — ABNORMAL HIGH (ref 70–99)
Potassium: 4.8 mmol/L (ref 3.5–5.1)
Sodium: 138 mmol/L (ref 135–145)
Total Bilirubin: 0.6 mg/dL (ref 0.3–1.2)
Total Protein: 5.8 g/dL — ABNORMAL LOW (ref 6.5–8.1)

## 2023-08-31 LAB — CBC WITH DIFFERENTIAL (CANCER CENTER ONLY)
Abs Immature Granulocytes: 0.01 10*3/uL (ref 0.00–0.07)
Basophils Absolute: 0.1 10*3/uL (ref 0.0–0.1)
Basophils Relative: 1 %
Eosinophils Absolute: 0.3 10*3/uL (ref 0.0–0.5)
Eosinophils Relative: 4 %
HCT: 35.1 % — ABNORMAL LOW (ref 39.0–52.0)
Hemoglobin: 11.9 g/dL — ABNORMAL LOW (ref 13.0–17.0)
Immature Granulocytes: 0 %
Lymphocytes Relative: 20 %
Lymphs Abs: 1.2 10*3/uL (ref 0.7–4.0)
MCH: 35.3 pg — ABNORMAL HIGH (ref 26.0–34.0)
MCHC: 33.9 g/dL (ref 30.0–36.0)
MCV: 104.2 fL — ABNORMAL HIGH (ref 80.0–100.0)
Monocytes Absolute: 0.4 10*3/uL (ref 0.1–1.0)
Monocytes Relative: 7 %
Neutro Abs: 4.2 10*3/uL (ref 1.7–7.7)
Neutrophils Relative %: 68 %
Platelet Count: 204 10*3/uL (ref 150–400)
RBC: 3.37 MIL/uL — ABNORMAL LOW (ref 4.22–5.81)
RDW: 13.7 % (ref 11.5–15.5)
WBC Count: 6.2 10*3/uL (ref 4.0–10.5)
nRBC: 0 % (ref 0.0–0.2)

## 2023-08-31 LAB — PHOSPHORUS: Phosphorus: 3.8 mg/dL (ref 2.5–4.6)

## 2023-08-31 LAB — CEA (ACCESS): CEA (CHCC): 1.26 ng/mL (ref 0.00–5.00)

## 2023-08-31 LAB — MAGNESIUM: Magnesium: 2 mg/dL (ref 1.7–2.4)

## 2023-09-01 ENCOUNTER — Encounter: Payer: Self-pay | Admitting: Hematology

## 2023-09-01 ENCOUNTER — Other Ambulatory Visit: Payer: Medicare Other

## 2023-09-01 ENCOUNTER — Inpatient Hospital Stay (HOSPITAL_BASED_OUTPATIENT_CLINIC_OR_DEPARTMENT_OTHER): Payer: Medicare Other | Admitting: Hematology

## 2023-09-01 ENCOUNTER — Inpatient Hospital Stay: Payer: Medicare Other | Admitting: Nutrition

## 2023-09-01 VITALS — BP 126/66 | HR 80 | Temp 98.6°F | Resp 18 | Ht 65.5 in | Wt 132.4 lb

## 2023-09-01 DIAGNOSIS — R1319 Other dysphagia: Secondary | ICD-10-CM

## 2023-09-01 DIAGNOSIS — C155 Malignant neoplasm of lower third of esophagus: Secondary | ICD-10-CM

## 2023-09-01 NOTE — Progress Notes (Signed)
Nutrition follow-up completed with patient and daughter after MD visit.  Patient's status post GJ tube placement on September 11.  Weight stable and documented as 132 pounds 6.4 ounces.  Labs noted: Glucose 135, BUN 36, creatinine 1.72, albumin 3.4, magnesium 2.0, phosphorus 3.8.  Patient is tolerating 3 cartons of [employer] 1.4 via G-tube.  He will increase to goal rate of 4 cartons today.  He is drinking fruit juice and water by mouth.  He has not been using all of the free water flushes as prescribed because he gets too full.  They are unable to tell me how much water he is drinking.  Daughter would like to cancel labs on Thursday, September 19 because they have another appointment.  She does not want to bring him in again until Monday, September 23 for additional labs.  Provider notified.  Estimated nutrition needs: 1600-1900 cal, 75-85 g protein, greater than 1.6 L fluid.  Nutrition diagnosis: Inadequate oral intake continues.  Intervention: Continue 1 carton of [employer] 1.4 via G-tube and increase to 4 times daily with 60 mL free water before and after boluses.  Give an additional 300 mL free water flushes 3 times daily between bolus feedings or drink by mouth if okay with MD. Patient to return for lab work on Monday.  4 cartons [employer] 1.4 provides 1820 cal, 80 g protein, 1824 mL free water.  This provides 100% estimated nutrition needs.  Monitoring, evaluation, goals: Patient will tolerate adequate calories and protein to minimize further weight loss.  Next visit: Approximately 1 month per daughter request.  **Disclaimer: This note was dictated with voice recognition software. Similar sounding words can inadvertently be transcribed and this note may contain transcription errors which may not have been corrected upon publication of note.**

## 2023-09-01 NOTE — Assessment & Plan Note (Addendum)
-  Secondary to esophageal cancer -He initially had a feeding tube, but it was removed in July 2024 after he had excellent response to radiation and it was able to eat adequately. -Due to the worsening dysphagia, he had feeding tube placed again by IR in early September 2024.

## 2023-09-01 NOTE — Assessment & Plan Note (Addendum)
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  -Received palliative radiation from 01/18/23-02/25/2023 -He responded well to RT, and was able to eat most food after therapy, and the feeding tube was removed in July 2024 -We again reviewed the option of chemotherapy, HER2 antibody and immunotherapy.  I do not think immunotherapy alone without chemotherapy will be much benefit.  Due to his advanced age, CKD, the goal of quality of her life, I will hold on systemic treatment for now. -his tumor showed PD-L1 CPS 25%, I do not think immunotherapy alone is sufficient to control his disease and do not recommend.  -He had EGD on 05/13/2023 which showed extensive inflammation at the previous cancer site, biopsy showed residual invasive adenocarcinoma. -Due to worsening dysphagia from cancer, he had feeding tube replacement again in early September 2024. -He has been referred to [facility] for cryoablation of his esophageal cancer. -We briefly discussed the option of Her2 antibody conjugate, such as Enhertu or Kadcyla if he has cancer progression and no more local therapy available.

## 2023-09-01 NOTE — Progress Notes (Signed)
[facility]   Telephone:([phone]) [phone] [phone]) [phone]   Clinic Follow up Note   Patient Care Team: Loyola Mast, NP as PCP - General (Family Medicine) Thomasene Ripple, DO as PCP - Cardiology (Cardiology) Malachy Mood, MD as Attending Physician (Hematology and Oncology)  Date of Service:  09/01/2023  CHIEF COMPLAINT: f/u of esophageal cancer     CURRENT THERAPY:  Supportive care     ASSESSMENT:  Daniel [name] is a 89 y.o. male with   Esophageal cancer (HCC) stage IV, with nodal and bone metastasis; MMR normal, PD-L1 + (CPS 25%), HER 2+ by IHC, positive by FISH -diagnosed in 12/2022 -PET 01/04/23 showed hypermetabolic distal esophageal uptake, and hypermetabolic mediastinal and bilateral hilar adenopathy, right pubic bone and anterior left sixth ribs, concerning for metastatic disease.  -Received palliative radiation from 01/18/23-02/25/2023 -He responded well to RT, and was able to eat most food after therapy, and the feeding tube was removed in July 2024 -We again reviewed the option of chemotherapy, HER2 antibody and immunotherapy.  I do not think immunotherapy alone without chemotherapy will be much benefit.  Due to his advanced age, CKD, the goal of quality of her life, I will hold on systemic treatment for now. -his tumor showed PD-L1 CPS 25%, I do not think immunotherapy alone is sufficient to control his disease and do not recommend.  -He had EGD on 05/13/2023 which showed extensive inflammation at the previous cancer site, biopsy showed residual invasive adenocarcinoma. -Due to worsening dysphagia from cancer, he had feeding tube replacement again in early September 2024. -He has been referred to [facility] for cryoablation of his esophageal cancer. -We briefly discussed the option of Her2 antibody conjugate, such as Enhertu or Kadcyla if he has cancer progression and no more local therapy available.  Dysphagia -Secondary to esophageal cancer -He initially had  a feeding tube, but it was removed in July 2024 after he had excellent response to radiation and it was able to eat adequately. -Due to the worsening dysphagia, he had feeding tube placed again by IR in early September 2024.    PLAN: -Pt has an appointment in [facility] this week to discuss cryoablation. -Pt to monitor weight at home -lab and f/u in 4 weeks   SUMMARY OF ONCOLOGIC HISTORY: Oncology History Overview Note   Cancer Staging  Lambda light chain myeloma (HCC) Staging form: Multiple Myeloma, AJCC 6th Edition - Clinical stage from 09/06/2018: Stage IB - Signed by Arthur Holms, MD on 09/20/2018     Lambda light chain myeloma (HCC)  09/02/2018 Initial Diagnosis   Lambda light chain myeloma (HCC)   09/06/2018 Miscellaneous   Baseline labs:  Kappa 56, lambda 4012, ratio 0.01 SPEP: M-spike 0.1 g/dl, monoclonal lambda light chain UPEP: M-spike 178 mg/dl, Bence Jones Protein positive; lambda type. Beta-2 micro: 5.9 Normal immunoglogulin heavy chains Hgb 11.4 Creatinine 2.08 No hypercalcemia   09/09/2018 Imaging   Metastatic skeletal survey negative   09/13/2018 Bone Marrow Biopsy   Morphology (Accession: [id number]): Bone Marrow, Aspirate,Biopsy, and Clot - HYPERCELLULAR BONE MARROW WITH PLASMA CELL NEOPLASM (12% cells, IHC showed lambda light chain restriction) - TRILINEAGE HEMATOPOIESIS. - SEE COMMENT.  Flow cytometry (Accession: [id number]):  - NO MONOCLONAL B-CELL POPULATION IDENTIFIED. - PREDOMINANCE OF T LYMPHOCYTES AND NATURAL KILLER CELLS.   01/05/2023 Pathology Results    FINAL MICROSCOPIC DIAGNOSIS:   A. STOMACH, BIOPSY:  - Gastric mucosa with focally active chronic gastritis, see comment  - Multifocal intestinal metaplasia, negative for dysplasia  B. GE JUNCTION MASS, BIOPSY:  - Invasive moderately differentiated adenocarcinoma    01/07/2023 Imaging    IMPRESSION: 1. Distal esophageal stricture extending over about a 3.9 cm vertical excursion. No  findings of metastatic disease to the chest, abdomen, or pelvis. 2. 0.5 cm sub solid nodule in the right middle lobe. Fleischner guidelines do not apply due to history of malignancy. Surveillance of the context of the patient's follow up oncology imaging is suggested. 3. Nonspecific lobulation of the left mid kidney. This could be incidental or due to CT equivocal end of a dromedary hump but I cannot exclude an underlying left mid kidney mass. This could be surveilled in the context of the patient's follow up oncology imaging, or if clinically warranted, renal MRI with and without contrast could be utilized for further characterization. 4. L1 compression fracture with 90% anterior compression and 6 mm of posterior bony retropulsion, likely late subacute chronicity. 5. Wall thickening in the proximal colon extending to the transverse colon, cannot exclude proximal colitis. 6. Other imaging findings of potential clinical significance: Coronary, aortic arch, and branch vessel atherosclerotic disease. Mitral and aortic valve calcification. Small type 1 hiatal hernia along the lower margin of the mass. Central airway thickening is present, suggesting bronchitis or reactive airways disease. Fatty deposition in the wall of the ascending colon may be incidental but which can have a weak association with inflammatory bowel disease. Lumbar spondylosis and degenerative disc disease contributing to multilevel impingement. 6 mm left kidney lower pole nonobstructive renal calculus.   Esophageal cancer (HCC)  01/05/2023 Pathology Results    FINAL MICROSCOPIC DIAGNOSIS:   A. STOMACH, BIOPSY:  - Gastric mucosa with focally active chronic gastritis, see comment  - Multifocal intestinal metaplasia, negative for dysplasia   B. GE JUNCTION MASS, BIOPSY:  - Invasive moderately differentiated adenocarcinoma    01/07/2023 Imaging    IMPRESSION: 1. Distal esophageal stricture extending over about a  3.9 cm vertical excursion. No findings of metastatic disease to the chest, abdomen, or pelvis. 2. 0.5 cm sub solid nodule in the right middle lobe. Fleischner guidelines do not apply due to history of malignancy. Surveillance of the context of the patient's follow up oncology imaging is suggested. 3. Nonspecific lobulation of the left mid kidney. This could be incidental or due to CT equivocal end of a dromedary hump but I cannot exclude an underlying left mid kidney mass. This could be surveilled in the context of the patient's follow up oncology imaging, or if clinically warranted, renal MRI with and without contrast could be utilized for further characterization. 4. L1 compression fracture with 90% anterior compression and 6 mm of posterior bony retropulsion, likely late subacute chronicity. 5. Wall thickening in the proximal colon extending to the transverse colon, cannot exclude proximal colitis. 6. Other imaging findings of potential clinical significance: Coronary, aortic arch, and branch vessel atherosclerotic disease. Mitral and aortic valve calcification. Small type 1 hiatal hernia along the lower margin of the mass. Central airway thickening is present, suggesting bronchitis or reactive airways disease. Fatty deposition in the wall of the ascending colon may be incidental but which can have a weak association with inflammatory bowel disease. Lumbar spondylosis and degenerative disc disease contributing to multilevel impingement. 6 mm left kidney lower pole nonobstructive renal calculus.     01/09/2023 Initial Diagnosis   Esophageal cancer (HCC)      INTERVAL HISTORY:  Daniel [name] is here for a follow up of esophageal cancer. He was last seen by me  on 07/21/2023. He presents to the clinic accompanied by daughter. Pt has appointment in [facility]. Pt has his J Tube reinserted. He has been drinking water by mouth. Pt also have some pain that comes across his abdomen  intermittent.Pt  Istill able to take care of himself and he lives alone.     All other systems were reviewed with the patient and are negative.  MEDICAL HISTORY:  Past Medical History:  Diagnosis Date   Blood transfusion    CKD (chronic kidney disease) stage 4, GFR 15-29 ml/min (HCC)    DVT (deep venous thrombosis) (HCC)    from surgery, no longer on Eliquis   Gastroesophageal cancer (HCC)    Gout    Hearing loss    Hypertension    Hypertriglyceridemia    Hypothyroid    Prediabetes    Prostate cancer (HCC)    Pulmonary nodule    Tinnitus     SURGICAL HISTORY: Past Surgical History:  Procedure Laterality Date   BIOPSY  01/05/2023   Procedure: BIOPSY;  Surgeon: Lemar Lofty., MD;  Location: [facility];  Service: Gastroenterology;;   BIOPSY  05/13/2023   Procedure: BIOPSY;  Surgeon: Lemar Lofty., MD;  Location: [facility] END[facility]COPY;  Service: Gastroenterology;;   CATARACT EXTRACTION Bilateral 2020   ESOPHAGOGASTRODUODENOSCOPY (EGD) WITH PROPOFOL N/A 01/05/2023   Procedure: ESOPHAGOGASTRODUODENOSCOPY (EGD) WITH PROPOFOL;  Surgeon: Lemar Lofty., MD;  Location: [facility];  Service: Gastroenterology;  Laterality: N/A;   ESOPHAGOGASTRODUODENOSCOPY (EGD) WITH PROPOFOL N/A 05/13/2023   Procedure: ESOPHAGOGASTRODUODENOSCOPY (EGD) WITH PROPOFOL;  Surgeon: Meridee Score Netty Starring., MD;  Location: [facility] ENDOSCOPY;  Service: Gastroenterology;  Laterality: N/A;   FOREIGN BODY REMOVAL  01/05/2023   Procedure: FOREIGN BODY REMOVAL;  Surgeon: Meridee Score Netty Starring., MD;  Location: [facility] ENDOSCOPY;  Service: Gastroenterology;;   HERNIA REPAIR Right 2010   HOT HEMOSTASIS N/A 05/13/2023   Procedure: HOT HEMOSTASIS (ARGON PLASMA COAGULATION/BICAP);  Surgeon: Lemar Lofty., MD;  Location: [facility] ENDOSCOPY;  Service: Gastroenterology;  Laterality: N/A;   IR GASTROSTOMY TUBE MOD SED  01/20/2023   IR GASTROSTOMY TUBE MOD SED  08/26/2023   IR GASTROSTOMY TUBE REMOVAL  07/08/2023    IR NASO G TUBE PLC W/FL W/RAD  01/20/2023   IR NASO G TUBE PLC W/FL W/RAD  08/26/2023   left inguinal hernia repair  06/29/2008   with mesh sail   PROSTATECTOMY  1998   prostate cancer   right ankle  1980   fracture   right knee  1980   torn meniscus   TOTAL KNEE ARTHROPLASTY Right 01/20/2022   Procedure: TOTAL KNEE ARTHROPLASTY;  Surgeon: Dannielle Huh, MD;  Location: [facility] ORS;  Service: Orthopedics;  Laterality: Right;    I have reviewed the social history and family history with the patient and they are unchanged from previous note.  ALLERGIES:  has No Known Allergies.  MEDICATIONS:  Current Outpatient Medications  Medication Sig Dispense Refill   allopurinol (ZYLOPRIM) 100 MG tablet Take 100 mg by mouth every Monday, Wednesday, and Friday.     ALPRAZolam (XANAX) 0.5 MG tablet Take 0.5 mg by mouth 3 (three) times daily as needed for sleep (tremors).     amLODipine (NORVASC) 5 MG tablet Take 5 mg by mouth daily.     apixaban (ELIQUIS) 2.5 MG TABS tablet Take 1 tablet (2.5 mg total) by mouth 2 (two) times daily. 180 tablet 3   cholecalciferol (VITAMIN D) 1000 units tablet Take 1,000 Units by mouth daily.  levothyroxine (SYNTHROID) 88 MCG tablet Take 88 mcg by mouth daily before breakfast.     metoprolol succinate (TOPROL XL) 25 MG 24 hr tablet Take 0.5 tablets (12.5 mg total) by mouth daily. 45 tablet 3   Nutritional Supplements (KATE FARMS STANDARD 1.4) LIQD 1 carton [employer] 1.4 via G tube 4 times daily with 60 mL free water flush before and after feedings. Give 300 mL free water 3 times daily between feedings. Provides 1820 cal, 80 gm pro, 1824 mL free water/100% estimated needs.     omeprazole (PRILOSEC) 40 MG capsule OPEN 1 CAPSULE AND SPRINKLE IN APPLESAUCE AND EAT TWICE DAILY 60 capsule 0   oxyCODONE (ROXICODONE) 5 MG/5ML solution Place 5 mLs (5 mg total) into feeding tube every 8 (eight) hours. 100 mL 0   sucralfate (CARAFATE) 1 GM/10ML suspension Take 10 mLs (1 g total) by  mouth 4 (four) times daily. 420 mL 12   No current facility-administered medications for this visit.    PHYSICAL EXAMINATION: ECOG PERFORMANCE STATUS: 2 - Symptomatic, <50% confined to bed  Vitals:   09/01/23 1507  BP: 126/66  Pulse: 80  Resp: 18  Temp: 98.6 F (37 C)  SpO2: 99%   Wt Readings from Last 3 Encounters:  09/01/23 132 lb 6.4 oz (60.1 kg)  08/26/23 132 lb (59.9 kg)  07/21/23 137 lb 8 oz (62.4 kg)     GENERAL:alert, no distress and comfortable SKIN: skin color, texture, turgor are normal, no rashes or significant lesions EYES: normal, Conjunctiva are pink and non-injected, sclera clear NECK: supple, thyroid normal size, non-tender, without nodularity LYMPH:  no palpable lymphadenopathy in the cervical, axillary  LUNGS: clear to auscultation and percussion with normal breathing effort HEART: regular rate & rhythm and no murmurs and no lower extremity edema ABDOMEN:abdomen soft, non-tender and normal bowel sounds Musculoskeletal:no cyanosis of digits and no clubbing  NEURO: alert & oriented x 3 with fluent speech, no focal motor/sensory deficits  LABORATORY DATA:  I have reviewed the data as listed    Latest Ref Rng & Units 08/31/2023    2:09 PM 08/26/2023   10:20 AM 07/06/2023   12:03 PM  CBC  WBC 4.0 - 10.5 K/uL 6.2  5.8  7.7   Hemoglobin 13.0 - 17.0 g/dL 16.1  09.6  04.5   Hematocrit 39.0 - 52.0 % 35.1  36.2  36.1   Platelets 150 - 400 K/uL 204  216  245         Latest Ref Rng & Units 08/31/2023    2:09 PM 08/26/2023   10:20 AM 08/26/2023    9:03 AM  CMP  Glucose 70 - 99 mg/dL 409  811  914   BUN 8 - 23 mg/dL 36  30  28   Creatinine 0.61 - 1.24 mg/dL 7.82  9.56  2.13   Sodium 135 - 145 mmol/L 138  140  141   Potassium 3.5 - 5.1 mmol/L 4.8  4.8  4.1   Chloride 98 - 111 mmol/L 105  105  106   CO2 22 - 32 mmol/L 29  27  26    Calcium 8.9 - 10.3 mg/dL 8.8  8.9  9.0   Total Protein 6.5 - 8.1 g/dL 5.8  6.1  5.8   Total Bilirubin 0.3 - 1.2 mg/dL 0.6  1.0   1.0   Alkaline Phos 38 - 126 U/L 69  59  63   AST 15 - 41 U/L 18  19  17  ALT 0 - 44 U/L 5  12  8        RADIOGRAPHIC STUDIES: I have personally reviewed the radiological images as listed and agreed with the findings in the report. No results found.    No orders of the defined types were placed in this encounter.  All questions were answered. The patient knows to call the clinic with any problems, questions or concerns. No barriers to learning was detected. The total time spent in the appointment was 25 minutes.     Malachy Mood, MD 09/01/2023   Carolin Coy, CMA, am acting as scribe for Malachy Mood, MD.   I have reviewed the above documentation for accuracy and completeness, and I agree with the above.

## 2023-09-03 ENCOUNTER — Inpatient Hospital Stay: Payer: Medicare Other

## 2023-09-03 ENCOUNTER — Telehealth: Payer: Self-pay | Admitting: Hematology

## 2023-09-04 NOTE — Radiation Completion Notes (Signed)
Patient Name: [name], Daniel MRN: [id number] Date of Birth: 12-24-1926 Referring Physician: Malachy Mood, M.D. Date of Service: 2023-09-04 Radiation Oncologist: Dorothy Puffer, M.D. [facility]                             RADIATION ONCOLOGY END OF TREATMENT NOTE     Diagnosis: C15.5 Malignant neoplasm of lower third of esophagus Intent: Curative     ==========DELIVERED PLANS==========  First Treatment Date: 2023-01-19 - Last Treatment Date: 2023-02-25   Plan Name: Esoph Site: Esophagus Technique: IMRT Mode: Photon Dose Per Fraction: 1.8 Gy Prescribed Dose (Delivered / Prescribed): 45 Gy / 45 Gy Prescribed Fxs (Delivered / Prescribed): 25 / 25   Plan Name: Esoph_Bst Site: Esophagus Technique: IMRT Mode: Photon Dose Per Fraction: 1.8 Gy Prescribed Dose (Delivered / Prescribed): 5.4 Gy / 5.4 Gy Prescribed Fxs (Delivered / Prescribed): 3 / 3     ==========ON TREATMENT VISIT DATES========== 2023-01-23, 2023-01-30, 2023-02-06, 2023-02-12, 2023-02-20, 2023-02-25     ==========UPCOMING VISITS==========       ==========APPENDIX - ON TREATMENT VISIT NOTES==========   See weekly On Treatment Notes in Epic for details.

## 2023-09-07 ENCOUNTER — Inpatient Hospital Stay: Payer: Medicare Other

## 2023-09-07 DIAGNOSIS — R1319 Other dysphagia: Secondary | ICD-10-CM

## 2023-09-07 DIAGNOSIS — C155 Malignant neoplasm of lower third of esophagus: Secondary | ICD-10-CM | POA: Diagnosis not present

## 2023-09-07 DIAGNOSIS — C159 Malignant neoplasm of esophagus, unspecified: Secondary | ICD-10-CM

## 2023-09-07 LAB — CMP (CANCER CENTER ONLY)
ALT: 7 U/L (ref 0–44)
AST: 19 U/L (ref 15–41)
Albumin: 3.5 g/dL (ref 3.5–5.0)
Alkaline Phosphatase: 73 U/L (ref 38–126)
Anion gap: 5 (ref 5–15)
BUN: 56 mg/dL — ABNORMAL HIGH (ref 8–23)
CO2: 27 mmol/L (ref 22–32)
Calcium: 8.9 mg/dL (ref 8.9–10.3)
Chloride: 105 mmol/L (ref 98–111)
Creatinine: 1.74 mg/dL — ABNORMAL HIGH (ref 0.61–1.24)
GFR, Estimated: 36 mL/min — ABNORMAL LOW (ref >60)
Glucose, Bld: 80 mg/dL (ref 70–99)
Potassium: 5.7 mmol/L — ABNORMAL HIGH (ref 3.5–5.1)
Sodium: 137 mmol/L (ref 135–145)
Total Bilirubin: 0.4 mg/dL (ref 0.3–1.2)
Total Protein: 5.9 g/dL — ABNORMAL LOW (ref 6.5–8.1)

## 2023-09-07 LAB — CBC WITH DIFFERENTIAL (CANCER CENTER ONLY)
Abs Immature Granulocytes: 0.03 10*3/uL (ref 0.00–0.07)
Basophils Absolute: 0 10*3/uL (ref 0.0–0.1)
Basophils Relative: 1 %
Eosinophils Absolute: 0.4 10*3/uL (ref 0.0–0.5)
Eosinophils Relative: 4 %
HCT: 35.9 % — ABNORMAL LOW (ref 39.0–52.0)
Hemoglobin: 12.2 g/dL — ABNORMAL LOW (ref 13.0–17.0)
Immature Granulocytes: 0 %
Lymphocytes Relative: 22 %
Lymphs Abs: 1.7 10*3/uL (ref 0.7–4.0)
MCH: 35.5 pg — ABNORMAL HIGH (ref 26.0–34.0)
MCHC: 34 g/dL (ref 30.0–36.0)
MCV: 104.4 fL — ABNORMAL HIGH (ref 80.0–100.0)
Monocytes Absolute: 0.6 10*3/uL (ref 0.1–1.0)
Monocytes Relative: 8 %
Neutro Abs: 5.2 10*3/uL (ref 1.7–7.7)
Neutrophils Relative %: 65 %
Platelet Count: 236 10*3/uL (ref 150–400)
RBC: 3.44 MIL/uL — ABNORMAL LOW (ref 4.22–5.81)
RDW: 14 % (ref 11.5–15.5)
WBC Count: 8 10*3/uL (ref 4.0–10.5)
nRBC: 0 % (ref 0.0–0.2)

## 2023-09-07 LAB — MAGNESIUM: Magnesium: 2.2 mg/dL (ref 1.7–2.4)

## 2023-09-07 LAB — PHOSPHORUS: Phosphorus: 4.4 mg/dL (ref 2.5–4.6)

## 2023-09-09 ENCOUNTER — Encounter: Payer: Self-pay | Admitting: Hematology

## 2023-09-10 ENCOUNTER — Ambulatory Visit: Payer: Medicare Other | Admitting: Nutrition

## 2023-09-10 ENCOUNTER — Inpatient Hospital Stay: Payer: Medicare Other

## 2023-09-10 DIAGNOSIS — R1319 Other dysphagia: Secondary | ICD-10-CM

## 2023-09-10 DIAGNOSIS — C155 Malignant neoplasm of lower third of esophagus: Secondary | ICD-10-CM | POA: Diagnosis not present

## 2023-09-10 DIAGNOSIS — C159 Malignant neoplasm of esophagus, unspecified: Secondary | ICD-10-CM

## 2023-09-10 LAB — CMP (CANCER CENTER ONLY)
ALT: 8 U/L (ref 0–44)
AST: 19 U/L (ref 15–41)
Albumin: 3.4 g/dL — ABNORMAL LOW (ref 3.5–5.0)
Alkaline Phosphatase: 68 U/L (ref 38–126)
Anion gap: 7 (ref 5–15)
BUN: 59 mg/dL — ABNORMAL HIGH (ref 8–23)
CO2: 25 mmol/L (ref 22–32)
Calcium: 8.7 mg/dL — ABNORMAL LOW (ref 8.9–10.3)
Chloride: 105 mmol/L (ref 98–111)
Creatinine: 1.67 mg/dL — ABNORMAL HIGH (ref 0.61–1.24)
GFR, Estimated: 38 mL/min — ABNORMAL LOW (ref >60)
Glucose, Bld: 113 mg/dL — ABNORMAL HIGH (ref 70–99)
Potassium: 5.3 mmol/L — ABNORMAL HIGH (ref 3.5–5.1)
Sodium: 137 mmol/L (ref 135–145)
Total Bilirubin: 0.4 mg/dL (ref 0.3–1.2)
Total Protein: 5.8 g/dL — ABNORMAL LOW (ref 6.5–8.1)

## 2023-09-10 LAB — MAGNESIUM: Magnesium: 2.3 mg/dL (ref 1.7–2.4)

## 2023-09-10 LAB — PHOSPHORUS: Phosphorus: 4.5 mg/dL (ref 2.5–4.6)

## 2023-09-10 NOTE — Progress Notes (Signed)
Daughter Bonita Quin contacted by phone regarding concerns that patient has lost weight, has increased fatigue, and diarrhea.  This RD agreed to see patient today after lab work.  Nutrition follow-up completed with patient and daughter after lab visit.  Patient is status post gastric tube placement on September 11.    Labs include potassium 5.3, glucose 113, BUN 59, creatinine 1.67, albumin 3.4, magnesium 2.3.  Phosphorus pending.   Weight decreased and documented as 128.8 pounds today decreased from 132 pounds 6.4 ounces September 17. Approximately 4 pound weight loss over 9 days.  Please note patient did not reach goal rate of tube feeding until September 17.  Some weight loss is not unexpected as patient was without adequate nutrition for approximately 1 week before he got to goal rate and some and sometime before feeding tube placed.  Patient unable to eat by mouth.  Family has been giving 1-1/3 cartons of [employer] 1.4/3 feedings daily instead of 1 carton 4 times daily.  They increased free water flushes before and after 120 mL instead of the 60 mL that was recommended.  They were not giving additional water between feedings as previously recommended. Patient reports he feels too full to add additional water.  Family appears to also be giving tube feeding and water flushes using plunger and pushing formula with some force over a short amount of time, likely less than 5 minutes.  Patient states that he has been dizzy and lightheaded.  He states stools are "erratic ".  When pressed for details he states he has 1-2 soft pudding-like stools daily and has not had a bowel movement today.  He denies nausea and vomiting.  Estimated nutrition needs: 1600-1900 cal, 75-85 g protein, greater than 1.6 L fluid.  Nutrition diagnosis: Inadequate oral intake continues.  Intervention: Re-educated daughter on using gravity to infuse 1 carton [employer] 1.4 with a 60 mL free water flush before and after 4 times daily.   Explained importance of spreading volume out throughout the day and not pushing or infusing feeding too quickly.  Enforced importance of trying 300 mL free water 3 times daily between feedings to meet hydration needs.  Demonstrated free water flush and administration of a small amount of feeding so daughter could see how fluids flow via gravity.  4 cartons [employer] 1.4+ free water flushes provides 1820 cal, 80 g protein, 1824 mL free water for 100% estimated nutrition needs.  Monitoring, evaluation, goals: Patient will tolerate adequate calories and protein to minimize weight loss.  Next visit: Monday, October 14 after labs and MD.  **Disclaimer: This note was dictated with voice recognition software. Similar sounding words can inadvertently be transcribed and this note may contain transcription errors which may not have been corrected upon publication of note.**

## 2023-09-10 NOTE — Telephone Encounter (Signed)
Looks like this patient is coming in for labs today. Does he need to be seen in symptom management?

## 2023-09-23 ENCOUNTER — Other Ambulatory Visit (HOSPITAL_COMMUNITY): Payer: Medicare Other

## 2023-09-24 ENCOUNTER — Other Ambulatory Visit: Payer: Medicare Other

## 2023-09-24 ENCOUNTER — Ambulatory Visit: Payer: Medicare Other | Admitting: Hematology

## 2023-09-28 ENCOUNTER — Inpatient Hospital Stay: Payer: Medicare Other | Admitting: Nutrition

## 2023-09-28 ENCOUNTER — Inpatient Hospital Stay: Payer: Medicare Other | Attending: Physician Assistant

## 2023-09-28 ENCOUNTER — Inpatient Hospital Stay (HOSPITAL_BASED_OUTPATIENT_CLINIC_OR_DEPARTMENT_OTHER): Payer: Medicare Other | Admitting: Hematology

## 2023-09-28 ENCOUNTER — Other Ambulatory Visit: Payer: Self-pay

## 2023-09-28 VITALS — BP 115/67 | HR 86 | Temp 98.5°F | Resp 16 | Ht 65.5 in | Wt 134.7 lb

## 2023-09-28 DIAGNOSIS — C7951 Secondary malignant neoplasm of bone: Secondary | ICD-10-CM | POA: Insufficient documentation

## 2023-09-28 DIAGNOSIS — C155 Malignant neoplasm of lower third of esophagus: Secondary | ICD-10-CM

## 2023-09-28 DIAGNOSIS — R1319 Other dysphagia: Secondary | ICD-10-CM

## 2023-09-28 DIAGNOSIS — C159 Malignant neoplasm of esophagus, unspecified: Secondary | ICD-10-CM

## 2023-09-28 LAB — CBC WITH DIFFERENTIAL (CANCER CENTER ONLY)
Abs Immature Granulocytes: 0.02 10*3/uL (ref 0.00–0.07)
Basophils Absolute: 0.1 10*3/uL (ref 0.0–0.1)
Basophils Relative: 1 %
Eosinophils Absolute: 0.3 10*3/uL (ref 0.0–0.5)
Eosinophils Relative: 5 %
HCT: 36.4 % — ABNORMAL LOW (ref 39.0–52.0)
Hemoglobin: 12.2 g/dL — ABNORMAL LOW (ref 13.0–17.0)
Immature Granulocytes: 0 %
Lymphocytes Relative: 16 %
Lymphs Abs: 1.1 10*3/uL (ref 0.7–4.0)
MCH: 36 pg — ABNORMAL HIGH (ref 26.0–34.0)
MCHC: 33.5 g/dL (ref 30.0–36.0)
MCV: 107.4 fL — ABNORMAL HIGH (ref 80.0–100.0)
Monocytes Absolute: 0.6 10*3/uL (ref 0.1–1.0)
Monocytes Relative: 8 %
Neutro Abs: 5.2 10*3/uL (ref 1.7–7.7)
Neutrophils Relative %: 70 %
Platelet Count: 225 10*3/uL (ref 150–400)
RBC: 3.39 MIL/uL — ABNORMAL LOW (ref 4.22–5.81)
RDW: 15 % (ref 11.5–15.5)
WBC Count: 7.3 10*3/uL (ref 4.0–10.5)
nRBC: 0 % (ref 0.0–0.2)

## 2023-09-28 LAB — PHOSPHORUS: Phosphorus: 4.2 mg/dL (ref 2.5–4.6)

## 2023-09-28 LAB — CMP (CANCER CENTER ONLY)
ALT: 11 U/L (ref 0–44)
AST: 24 U/L (ref 15–41)
Albumin: 3.5 g/dL (ref 3.5–5.0)
Alkaline Phosphatase: 70 U/L (ref 38–126)
Anion gap: 7 (ref 5–15)
BUN: 69 mg/dL — ABNORMAL HIGH (ref 8–23)
CO2: 27 mmol/L (ref 22–32)
Calcium: 9 mg/dL (ref 8.9–10.3)
Chloride: 103 mmol/L (ref 98–111)
Creatinine: 1.68 mg/dL — ABNORMAL HIGH (ref 0.61–1.24)
GFR, Estimated: 37 mL/min — ABNORMAL LOW (ref >60)
Glucose, Bld: 119 mg/dL — ABNORMAL HIGH (ref 70–99)
Potassium: 5.6 mmol/L — ABNORMAL HIGH (ref 3.5–5.1)
Sodium: 137 mmol/L (ref 135–145)
Total Bilirubin: 0.3 mg/dL (ref 0.3–1.2)
Total Protein: 6.1 g/dL — ABNORMAL LOW (ref 6.5–8.1)

## 2023-09-28 LAB — MAGNESIUM: Magnesium: 2.3 mg/dL (ref 1.7–2.4)

## 2023-09-28 NOTE — Progress Notes (Signed)
[facility]   Telephone:([phone] Fax:(336) [phone]   Clinic Follow up Note   Patient Care Team: Loyola Mast, NP as PCP - General (Family Medicine) Thomasene Ripple, DO as PCP - Cardiology (Cardiology) Malachy Mood, MD as Attending Physician (Hematology and Oncology)  Date of Service:  09/28/2023  CHIEF COMPLAINT: f/u of metastatic esophageal cancer  CURRENT THERAPY:  Supportive care  Oncology History   Esophageal cancer (HCC) stage IV, with nodal and bone metastasis; MMR normal, PD-L1 + (CPS 25%), HER 2+ by IHC, positive by FISH -diagnosed in 12/2022 -PET 01/04/23 showed hypermetabolic distal esophageal uptake, and hypermetabolic mediastinal and bilateral hilar adenopathy, right pubic bone and anterior left sixth ribs, concerning for metastatic disease.  -Received palliative radiation from 01/18/23-02/25/2023 -He responded well to RT, and was able to eat most food after therapy, and the feeding tube was removed in July 2024 -We again reviewed the option of chemotherapy, HER2 antibody and immunotherapy.  I do not think immunotherapy alone without chemotherapy will be much benefit.  Due to his advanced age, CKD, the goal of quality of her life, I will hold on systemic treatment for now. -his tumor showed PD-L1 CPS 25%, I do not think immunotherapy alone is sufficient to control his disease and do not recommend.  -He had EGD on 05/13/2023 which showed extensive inflammation at the previous cancer site, biopsy showed residual invasive adenocarcinoma. -Due to worsening dysphagia from cancer, he had feeding tube replacement again in early September 2024. -He has been referred to [facility] for cryoablation of his esophageal cancer.   Assessment and Plan    Esophageal Cancer Patient is unable to tolerate solid food and is currently on tube feeding. Discussed potential palliative therapies including stent placement and cryotherapy. Concerns about cryotherapy causing  perforation due to thinning of the esophageal wall. -Continue tube feeding. -Consider stent placement if offered by GI specialist at [facility]. -Plan to discuss potential for additional radiation therapy with radiation oncologist, Dr. Mitzi Hansen.  New Onset Right Hip Pain Pain localized to the right hip area, worsening with movement. No clear cause identified yet. -Order MRI to evaluate for potential causes including cancer metastasis or kidney stone. -If pain is related to cancer in the bone, consider referral to Dr. Mitzi Hansen for potential radiation therapy for pain control.  General Health Maintenance -Stable hemoglobin at 12.2, indicating no active bleeding. -Plan for phone visit in 4-6 weeks to assess status. -If patient's condition worsens, consider discussion about hospice care.     Plan -He will follow-up with his GI at the [facility], to see if they will offer stent placement -Will discuss with Dr. Mitzi Hansen about image for his right pelvic/hip, to see if he needs palliative radiation for his pain -Phone visit in 4 to 5 weeks, for follow-up    SUMMARY OF ONCOLOGIC HISTORY: Oncology History Overview Note   Cancer Staging  Lambda light chain myeloma (HCC) Staging form: Multiple Myeloma, AJCC 6th Edition - Clinical stage from 09/06/2018: Stage IB - Signed by Arthur Holms, MD on 09/20/2018     Lambda light chain myeloma (HCC)  09/02/2018 Initial Diagnosis   Lambda light chain myeloma (HCC)   09/06/2018 Miscellaneous   Baseline labs:  Kappa 56, lambda 4012, ratio 0.01 SPEP: M-spike 0.1 g/dl, monoclonal lambda light chain UPEP: M-spike 178 mg/dl, Bence Jones Protein positive; lambda type. Beta-2 micro: 5.9 Normal immunoglogulin heavy chains Hgb 11.4 Creatinine 2.08 No hypercalcemia   09/09/2018 Imaging   Metastatic  skeletal survey negative   09/13/2018 Bone Marrow Biopsy   Morphology (Accession: [id number]): Bone Marrow, Aspirate,Biopsy, and Clot - HYPERCELLULAR BONE MARROW  WITH PLASMA CELL NEOPLASM (12% cells, IHC showed lambda light chain restriction) - TRILINEAGE HEMATOPOIESIS. - SEE COMMENT.  Flow cytometry (Accession: [id number]):  - NO MONOCLONAL B-CELL POPULATION IDENTIFIED. - PREDOMINANCE OF T LYMPHOCYTES AND NATURAL KILLER CELLS.   01/05/2023 Pathology Results    FINAL MICROSCOPIC DIAGNOSIS:   A. STOMACH, BIOPSY:  - Gastric mucosa with focally active chronic gastritis, see comment  - Multifocal intestinal metaplasia, negative for dysplasia   B. GE JUNCTION MASS, BIOPSY:  - Invasive moderately differentiated adenocarcinoma    01/07/2023 Imaging    IMPRESSION: 1. Distal esophageal stricture extending over about a 3.9 cm vertical excursion. No findings of metastatic disease to the chest, abdomen, or pelvis. 2. 0.5 cm sub solid nodule in the right middle lobe. Fleischner guidelines do not apply due to history of malignancy. Surveillance of the context of the patient's follow up oncology imaging is suggested. 3. Nonspecific lobulation of the left mid kidney. This could be incidental or due to CT equivocal end of a dromedary hump but I cannot exclude an underlying left mid kidney mass. This could be surveilled in the context of the patient's follow up oncology imaging, or if clinically warranted, renal MRI with and without contrast could be utilized for further characterization. 4. L1 compression fracture with 90% anterior compression and 6 mm of posterior bony retropulsion, likely late subacute chronicity. 5. Wall thickening in the proximal colon extending to the transverse colon, cannot exclude proximal colitis. 6. Other imaging findings of potential clinical significance: Coronary, aortic arch, and branch vessel atherosclerotic disease. Mitral and aortic valve calcification. Small type 1 hiatal hernia along the lower margin of the mass. Central airway thickening is present, suggesting bronchitis or reactive airways disease.  Fatty deposition in the wall of the ascending colon may be incidental but which can have a weak association with inflammatory bowel disease. Lumbar spondylosis and degenerative disc disease contributing to multilevel impingement. 6 mm left kidney lower pole nonobstructive renal calculus.   Esophageal cancer (HCC)  01/05/2023 Pathology Results    FINAL MICROSCOPIC DIAGNOSIS:   A. STOMACH, BIOPSY:  - Gastric mucosa with focally active chronic gastritis, see comment  - Multifocal intestinal metaplasia, negative for dysplasia   B. GE JUNCTION MASS, BIOPSY:  - Invasive moderately differentiated adenocarcinoma    01/07/2023 Imaging    IMPRESSION: 1. Distal esophageal stricture extending over about a 3.9 cm vertical excursion. No findings of metastatic disease to the chest, abdomen, or pelvis. 2. 0.5 cm sub solid nodule in the right middle lobe. Fleischner guidelines do not apply due to history of malignancy. Surveillance of the context of the patient's follow up oncology imaging is suggested. 3. Nonspecific lobulation of the left mid kidney. This could be incidental or due to CT equivocal end of a dromedary hump but I cannot exclude an underlying left mid kidney mass. This could be surveilled in the context of the patient's follow up oncology imaging, or if clinically warranted, renal MRI with and without contrast could be utilized for further characterization. 4. L1 compression fracture with 90% anterior compression and 6 mm of posterior bony retropulsion, likely late subacute chronicity. 5. Wall thickening in the proximal colon extending to the transverse colon, cannot exclude proximal colitis. 6. Other imaging findings of potential clinical significance: Coronary, aortic arch, and branch vessel atherosclerotic disease. Mitral and aortic valve calcification. Small type 1  hiatal hernia along the lower margin of the mass. Central airway thickening is present, suggesting bronchitis  or reactive airways disease. Fatty deposition in the wall of the ascending colon may be incidental but which can have a weak association with inflammatory bowel disease. Lumbar spondylosis and degenerative disc disease contributing to multilevel impingement. 6 mm left kidney lower pole nonobstructive renal calculus.     01/09/2023 Initial Diagnosis   Esophageal cancer (HCC)      Discussed the use of AI scribe software for clinical note transcription with the patient, who gave verbal consent to proceed.  History of Present Illness   A 89 year old patient with a history of esophageal cancer presents for a follow-up visit. The patient has been on tube feeding due to inability to tolerate solid food. Despite this, the patient's weight has improved, gaining five to six pounds since the last visit. The patient reports new pain in the hip area, which has been present for about a week. The pain is located near the right kidney and is more pronounced when the patient moves or walks. The patient has a history of kidney stones, but the current pain does not seem to be related to kidney stones. The patient is able to perform self-care activities, although with difficulty. The patient's primary goal is to increase oral intake.         All other systems were reviewed with the patient and are negative.  MEDICAL HISTORY:  Past Medical History:  Diagnosis Date   Blood transfusion    CKD (chronic kidney disease) stage 4, GFR 15-29 ml/min (HCC)    DVT (deep venous thrombosis) (HCC)    from surgery, no longer on Eliquis   Gastroesophageal cancer (HCC)    Gout    Hearing loss    Hypertension    Hypertriglyceridemia    Hypothyroid    Prediabetes    Prostate cancer (HCC)    Pulmonary nodule    Tinnitus     SURGICAL HISTORY: Past Surgical History:  Procedure Laterality Date   BIOPSY  01/05/2023   Procedure: BIOPSY;  Surgeon: Lemar Lofty., MD;  Location: [facility] ENDOSCOPY;  Service:  Gastroenterology;;   BIOPSY  05/13/2023   Procedure: BIOPSY;  Surgeon: Lemar Lofty., MD;  Location: [facility] ENDOSCOPY;  Service: Gastroenterology;;   CATARACT EXTRACTION Bilateral 2020   ESOPHAGOGASTRODUODENOSCOPY (EGD) WITH PROPOFOL N/A 01/05/2023   Procedure: ESOPHAGOGASTRODUODENOSCOPY (EGD) WITH PROPOFOL;  Surgeon: Lemar Lofty., MD;  Location: [facility] ENDOSCOPY;  Service: Gastroenterology;  Laterality: N/A;   ESOPHAGOGASTRODUODENOSCOPY (EGD) WITH PROPOFOL N/A 05/13/2023   Procedure: ESOPHAGOGASTRODUODENOSCOPY (EGD) WITH PROPOFOL;  Surgeon: Meridee Score Netty Starring., MD;  Location: [facility] ENDOSCOPY;  Service: Gastroenterology;  Laterality: N/A;   FOREIGN BODY REMOVAL  01/05/2023   Procedure: FOREIGN BODY REMOVAL;  Surgeon: Meridee Score Netty Starring., MD;  Location: [facility] ENDOSCOPY;  Service: Gastroenterology;;   HERNIA REPAIR Right 2010   HOT HEMOSTASIS N/A 05/13/2023   Procedure: HOT HEMOSTASIS (ARGON PLASMA COAGULATION/BICAP);  Surgeon: Lemar Lofty., MD;  Location: [facility] ENDOSCOPY;  Service: Gastroenterology;  Laterality: N/A;   IR GASTROSTOMY TUBE MOD SED  01/20/2023   IR GASTROSTOMY TUBE MOD SED  08/26/2023   IR GASTROSTOMY TUBE REMOVAL  07/08/2023   IR NASO G TUBE PLC W/FL W/RAD  01/20/2023   IR NASO G TUBE PLC W/FL W/RAD  08/26/2023   left inguinal hernia repair  06/29/2008   with mesh sail   PROSTATECTOMY  1998   prostate cancer   right ankle  1980  fracture   right knee  1980   torn meniscus   TOTAL KNEE ARTHROPLASTY Right 01/20/2022   Procedure: TOTAL KNEE ARTHROPLASTY;  Surgeon: Dannielle Huh, MD;  Location: [facility] ORS;  Service: Orthopedics;  Laterality: Right;    I have reviewed the social history and family history with the patient and they are unchanged from previous note.  ALLERGIES:  has No Known Allergies.  MEDICATIONS:  Current Outpatient Medications  Medication Sig Dispense Refill   allopurinol (ZYLOPRIM) 100 MG tablet Take 100 mg by mouth every Monday, Wednesday,  and Friday.     ALPRAZolam (XANAX) 0.5 MG tablet Take 0.5 mg by mouth 3 (three) times daily as needed for sleep (tremors).     amLODipine (NORVASC) 5 MG tablet Take 5 mg by mouth daily.     apixaban (ELIQUIS) 2.5 MG TABS tablet Take 1 tablet (2.5 mg total) by mouth 2 (two) times daily. 180 tablet 3   cholecalciferol (VITAMIN D) 1000 units tablet Take 1,000 Units by mouth daily.     levothyroxine (SYNTHROID) 88 MCG tablet Take 88 mcg by mouth daily before breakfast.     metoprolol succinate (TOPROL XL) 25 MG 24 hr tablet Take 0.5 tablets (12.5 mg total) by mouth daily. 45 tablet 3   Nutritional Supplements (KATE FARMS STANDARD 1.4) LIQD 1 carton [employer] 1.4 via G tube 4 times daily with 60 mL free water flush before and after feedings. Give 300 mL free water 3 times daily between feedings. Provides 1820 cal, 80 gm pro, 1824 mL free water/100% estimated needs.     omeprazole (PRILOSEC) 40 MG capsule OPEN 1 CAPSULE AND SPRINKLE IN APPLESAUCE AND EAT TWICE DAILY 60 capsule 0   oxyCODONE (ROXICODONE) 5 MG/5ML solution Place 5 mLs (5 mg total) into feeding tube every 8 (eight) hours. 100 mL 0   sucralfate (CARAFATE) 1 GM/10ML suspension Take 10 mLs (1 g total) by mouth 4 (four) times daily. 420 mL 12   No current facility-administered medications for this visit.    PHYSICAL EXAMINATION: ECOG PERFORMANCE STATUS: 2 - Symptomatic, <50% confined to bed  Vitals:   09/28/23 1318  BP: 115/67  Pulse: 86  Resp: 16  Temp: 98.5 F (36.9 C)  SpO2: 99%   Wt Readings from Last 3 Encounters:  09/28/23 134 lb 11.2 oz (61.1 kg)  09/10/23 128 lb 12.8 oz (58.4 kg)  09/01/23 132 lb 6.4 oz (60.1 kg)     GENERAL:alert, no distress and comfortable SKIN: skin color, texture, turgor are normal, no rashes or significant lesions EYES: normal, Conjunctiva are pink and non-injected, sclera clear NECK: supple, thyroid normal size, non-tender, without nodularity LYMPH:  no palpable lymphadenopathy in the  cervical, axillary  LUNGS: clear to auscultation and percussion with normal breathing effort HEART: regular rate & rhythm and no murmurs and no lower extremity edema ABDOMEN:abdomen soft, non-tender and normal bowel sounds Musculoskeletal:no cyanosis of digits and no clubbing  NEURO: alert & oriented x 3 with fluent speech, no focal motor/sensory deficits   LABORATORY DATA:  I have reviewed the data as listed    Latest Ref Rng & Units 09/28/2023   12:57 PM 09/07/2023    2:18 PM 08/31/2023    2:09 PM  CBC  WBC 4.0 - 10.5 K/uL 7.3  8.0  6.2   Hemoglobin 13.0 - 17.0 g/dL 16.1  09.6  04.5   Hematocrit 39.0 - 52.0 % 36.4  35.9  35.1   Platelets 150 - 400 K/uL 225  236  204         Latest Ref Rng & Units 09/28/2023   12:57 PM 09/10/2023    2:42 PM 09/07/2023    2:18 PM  CMP  Glucose 70 - 99 mg/dL 161  096  80   BUN 8 - 23 mg/dL 69  59  56   Creatinine 0.61 - 1.24 mg/dL 0.45  4.09  8.11   Sodium 135 - 145 mmol/L 137  137  137   Potassium 3.5 - 5.1 mmol/L 5.6  5.3  5.7   Chloride 98 - 111 mmol/L 103  105  105   CO2 22 - 32 mmol/L 27  25  27    Calcium 8.9 - 10.3 mg/dL 9.0  8.7  8.9   Total Protein 6.5 - 8.1 g/dL 6.1  5.8  5.9   Total Bilirubin 0.3 - 1.2 mg/dL 0.3  0.4  0.4   Alkaline Phos 38 - 126 U/L 70  68  73   AST 15 - 41 U/L 24  19  19    ALT 0 - 44 U/L 11  8  7        RADIOGRAPHIC STUDIES: I have personally reviewed the radiological images as listed and agreed with the findings in the report. No results found.    No orders of the defined types were placed in this encounter.  All questions were answered. The patient knows to call the clinic with any problems, questions or concerns. No barriers to learning was detected. The total time spent in the appointment was 25 minutes.     Malachy Mood, MD 09/28/2023

## 2023-09-28 NOTE — Progress Notes (Signed)
Nutrition follow up completed with patient and daughter.  Patient had gastric tube placed on September 11.  He is followed by Dr. Mosetta Putt.    Weight improved to 134 pounds 11.2 ounces on October 14.  Patient weighed 128.8 pounds September 26.  Labs include potassium 5.6, glucose 119, BUN 69, creatinine 1.68, and magnesium 2.3.  Phosphorus is pending.  Home health: [facility]  Patient does not like having a feeding tube.  He reports he is full all the time.  States tube feeding makes his stools like tar.  States he tries to do several different suppositories or laxatives to help with constipation.  Patient reports he cannot tolerate more water because there is no place for it to go.  He is not eating by mouth.  Daughter reports she continues to give 1 1/3 cartons of Molli Posey 1.4-3 times a day.  She still flushes with 120 mL free water before and after.  Educated again that this was a lot of volume at 1 time which is why it is recommended to spread out feedings and free water flushes for better tolerance.  Give feedings via gravity.  This education was given with a different daughter at last visit.  Patient reports increased pain but refuses to take pain medication.  Nutrition diagnosis: Inadequate oral intake, ongoing.  Nutrition needs met with tube feeding.  Estimated nutrition needs: 1600-1900 cal, 75-85 g protein, greater than 1.6 L fluid.  Intervention: Give 1 carton of [employer] 1.4 4 times daily with 60 mL free water before and after bolus feedings. Flush feeding tube with an additional 300 mL free water 3 times daily. Continue 4 cartons of [employer] 1.4+ free water flushes to provide 100% estimated needs/1820 cal, 80 g protein, 1824 mL free water.  Monitoring, evaluation, goals: Patient will tolerate tube feedings plus free water flushes as to meet greater than 90% estimated needs.  Daughter would like to call to schedule nutrition follow-up.  Agreed to contact me by phone for questions  or concerns before then.  **Disclaimer: This note was dictated with voice recognition software. Similar sounding words can inadvertently be transcribed and this note may contain transcription errors which may not have been corrected upon publication of note.**

## 2023-09-28 NOTE — Assessment & Plan Note (Signed)
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  -Received palliative radiation from 01/18/23-02/25/2023 -He responded well to RT, and was able to eat most food after therapy, and the feeding tube was removed in July 2024 -We again reviewed the option of chemotherapy, HER2 antibody and immunotherapy.  I do not think immunotherapy alone without chemotherapy will be much benefit.  Due to his advanced age, CKD, the goal of quality of her life, I will hold on systemic treatment for now. -his tumor showed PD-L1 CPS 25%, I do not think immunotherapy alone is sufficient to control his disease and do not recommend.  -He had EGD on 05/13/2023 which showed extensive inflammation at the previous cancer site, biopsy showed residual invasive adenocarcinoma. -Due to worsening dysphagia from cancer, he had feeding tube replacement again in early September 2024. -He has been referred to [facility] for cryoablation of his esophageal cancer. -We briefly discussed the option of Her2 antibody conjugate, such as Enhertu or Kadcyla if he has cancer progression and no more local therapy available.

## 2023-09-29 ENCOUNTER — Other Ambulatory Visit: Payer: Self-pay

## 2023-09-29 ENCOUNTER — Telehealth: Payer: Self-pay | Admitting: Hematology

## 2023-09-29 DIAGNOSIS — R1319 Other dysphagia: Secondary | ICD-10-CM

## 2023-09-29 DIAGNOSIS — C159 Malignant neoplasm of esophagus, unspecified: Secondary | ICD-10-CM

## 2023-09-29 DIAGNOSIS — C155 Malignant neoplasm of lower third of esophagus: Secondary | ICD-10-CM

## 2023-09-29 NOTE — Progress Notes (Signed)
Order given by Dr. Mosetta Putt via staff message.  Malachy Mood, MD  Dorothy Puffer, MD; Ronny Bacon, PA-C; Arcelia Jew, RN; Nunzio Cobbs, Gwyneth Revels,  Please order pelvic MRI w wo contrast and schedule asap, so we can decide if he needs palliative RT  Thanks  Terrace Arabia

## 2023-09-30 ENCOUNTER — Ambulatory Visit (HOSPITAL_COMMUNITY)
Admission: RE | Admit: 2023-09-30 | Discharge: 2023-09-30 | Disposition: A | Discharge status: Home / Self Care | Payer: Medicare Other | Source: Ambulatory Visit | Attending: Hematology | Admitting: Hematology

## 2023-09-30 DIAGNOSIS — C159 Malignant neoplasm of esophagus, unspecified: Secondary | ICD-10-CM | POA: Insufficient documentation

## 2023-09-30 DIAGNOSIS — C155 Malignant neoplasm of lower third of esophagus: Secondary | ICD-10-CM | POA: Diagnosis present

## 2023-09-30 DIAGNOSIS — R1319 Other dysphagia: Secondary | ICD-10-CM | POA: Diagnosis present

## 2023-09-30 MED ORDER — GADOBUTROL 1 MMOL/ML IV SOLN
6.0000 mL | Freq: Once | INTRAVENOUS | Status: AC | PRN
  Administered 2023-09-30: 6 mL via INTRAVENOUS

## 2023-10-02 ENCOUNTER — Telehealth: Payer: Self-pay | Admitting: *Deleted

## 2023-10-02 ENCOUNTER — Telehealth: Payer: Self-pay | Admitting: Radiation Oncology

## 2023-10-02 ENCOUNTER — Other Ambulatory Visit: Payer: Self-pay

## 2023-10-02 DIAGNOSIS — M899 Disorder of bone, unspecified: Secondary | ICD-10-CM

## 2023-10-02 NOTE — Telephone Encounter (Signed)
Telephone call to daughter Bonita Quin to discuss MRI results and referral to radiation. Daughter understands and agrees with plan for palliative radiation.   Referral placed.

## 2023-10-02 NOTE — Telephone Encounter (Signed)
Called patient's daughter to schedule a consult with Jill Side. No answer, LVM for a return call.

## 2023-10-05 ENCOUNTER — Encounter: Payer: Self-pay | Admitting: Nutrition

## 2023-10-05 ENCOUNTER — Telehealth: Payer: Self-pay | Admitting: Radiation Oncology

## 2023-10-05 ENCOUNTER — Ambulatory Visit: Payer: Medicare Other | Admitting: Nutrition

## 2023-10-05 ENCOUNTER — Telehealth: Payer: Self-pay

## 2023-10-05 MED ORDER — KATE FARMS RENAL SUPPORT 1.8 EN LIQD: 250.0000 mL | Freq: Four times a day (QID) | ENTERAL | Status: AC

## 2023-10-05 NOTE — Telephone Encounter (Signed)
Called the daughter of the patient to schedule the patient for a consult w. Jill Side. No answer, LVM for a return call.

## 2023-10-05 NOTE — Telephone Encounter (Addendum)
Called this patient and relayed message below as per Dr. Mosetta Putt. Patient voiced full understanding.   ----- Message from Malachy Mood sent at 10/03/2023 11:46 AM EDT ----- Please let pt know that his K level is elevated, and avoid high K diet or supplement.   Barb, does he need change his tube feeds based his K level? Thanks   Malachy Mood

## 2023-10-05 NOTE — Progress Notes (Signed)
Notified by MD potassium remains elevated.  Questioning change in tube feeding.  Potassium: 09-28-2023 - 5.6 09-10-2023 - 5.3 09-07-2023 - 5.7 08-31-2023 - 4.8  He currently infuses 4 cartons of [employer] 1.4 via G-tube daily providing 2800 mL equivalents potassium.  Tube feeding provides 100% estimated needs.  Patient does not eat by mouth.  Expect patient to need tube feeding long-term.  Change tube feedings to [facility] renal support 1.8.     Give 1 carton [employer] renal support 1.8 - 4 times a day via G-tube with 120 mL of free water before and after each carton.  Flush 180 mL once daily through feeding tube between bolus feeds.  This will provide 1800 cal, 80 g protein, 1800 mL free water and 1000 mEq potassium.    Estimated nutrition needs: 1600-1900 cal, 75-85 g protein, greater than 1.6 L fluid.  Will contact [facility] to communicate new orders.  Contacted daughter, Jaynie Crumble who was unavailable. LM and will call tomorrow to follow up.

## 2023-10-06 ENCOUNTER — Inpatient Hospital Stay: Payer: Medicare Other | Admitting: Nutrition

## 2023-10-06 NOTE — Progress Notes (Signed)
Telephone follow-up call completed with daughter Bonita Quin.  Linda aware that patient's potassium has been elevated for a month or so.  She discovered patient has been hiding tomato juice in his room.  He cuts it with water and drinks it.  She thinks he has been pouring it into his feeding tube as well.  I believe it is unlikely patient is able to give himself any through the G-tube due to his diminished dexterity.  She is not aware how much he has been drinking or for how long.  She reports patient is well aware of high potassium foods and what he is supposed to avoid.  Patient's constipation has resolved.  Bonita Quin states they are able to infuse more water through his G-tube.  Educated regarding new tube feeding regimen.  Explained patient should receive 1 carton Molli Posey renal support 1.8 - 4 times a day via G-tube with 120 mL of free water before and after each carton.  She was instructed to give patient an additional water flush - 180 mL once a day.  This provides 1800 cal, 80 g of protein and 1800 mL free water.  This meets 100% estimated needs.  Encouraged her to educate patient again to avoid high potassium liquids.

## 2023-10-08 ENCOUNTER — Ambulatory Visit
Admission: RE | Admit: 2023-10-08 | Discharge: 2023-10-08 | Disposition: A | Discharge status: Home / Self Care | Payer: Medicare Other | Source: Ambulatory Visit | Attending: Radiation Oncology | Admitting: Radiation Oncology

## 2023-10-08 ENCOUNTER — Other Ambulatory Visit: Payer: Self-pay

## 2023-10-08 ENCOUNTER — Encounter: Payer: Self-pay | Admitting: Radiation Oncology

## 2023-10-08 VITALS — BP 115/76 | HR 74 | Temp 97.9°F | Resp 19 | Ht 65.5 in | Wt 133.0 lb

## 2023-10-08 DIAGNOSIS — Z510 Encounter for antineoplastic radiation therapy: Secondary | ICD-10-CM | POA: Diagnosis present

## 2023-10-08 DIAGNOSIS — G893 Neoplasm related pain (acute) (chronic): Secondary | ICD-10-CM | POA: Diagnosis not present

## 2023-10-08 DIAGNOSIS — C9001 Multiple myeloma in remission: Secondary | ICD-10-CM | POA: Insufficient documentation

## 2023-10-08 DIAGNOSIS — I129 Hypertensive chronic kidney disease with stage 1 through stage 4 chronic kidney disease, or unspecified chronic kidney disease: Secondary | ICD-10-CM | POA: Diagnosis not present

## 2023-10-08 DIAGNOSIS — Z79899 Other long term (current) drug therapy: Secondary | ICD-10-CM | POA: Diagnosis not present

## 2023-10-08 DIAGNOSIS — M25551 Pain in right hip: Secondary | ICD-10-CM

## 2023-10-08 DIAGNOSIS — M109 Gout, unspecified: Secondary | ICD-10-CM | POA: Diagnosis not present

## 2023-10-08 DIAGNOSIS — Z7901 Long term (current) use of anticoagulants: Secondary | ICD-10-CM | POA: Insufficient documentation

## 2023-10-08 DIAGNOSIS — Z87891 Personal history of nicotine dependence: Secondary | ICD-10-CM | POA: Diagnosis not present

## 2023-10-08 DIAGNOSIS — E039 Hypothyroidism, unspecified: Secondary | ICD-10-CM | POA: Insufficient documentation

## 2023-10-08 DIAGNOSIS — Z923 Personal history of irradiation: Secondary | ICD-10-CM | POA: Diagnosis not present

## 2023-10-08 DIAGNOSIS — Z8042 Family history of malignant neoplasm of prostate: Secondary | ICD-10-CM | POA: Diagnosis not present

## 2023-10-08 DIAGNOSIS — E785 Hyperlipidemia, unspecified: Secondary | ICD-10-CM | POA: Diagnosis not present

## 2023-10-08 DIAGNOSIS — Z86718 Personal history of other venous thrombosis and embolism: Secondary | ICD-10-CM | POA: Insufficient documentation

## 2023-10-08 DIAGNOSIS — Z79890 Hormone replacement therapy (postmenopausal): Secondary | ICD-10-CM | POA: Diagnosis not present

## 2023-10-08 DIAGNOSIS — C155 Malignant neoplasm of lower third of esophagus: Secondary | ICD-10-CM | POA: Insufficient documentation

## 2023-10-08 DIAGNOSIS — N184 Chronic kidney disease, stage 4 (severe): Secondary | ICD-10-CM | POA: Diagnosis not present

## 2023-10-08 DIAGNOSIS — R609 Edema, unspecified: Secondary | ICD-10-CM | POA: Diagnosis not present

## 2023-10-08 DIAGNOSIS — Z8546 Personal history of malignant neoplasm of prostate: Secondary | ICD-10-CM | POA: Diagnosis not present

## 2023-10-08 DIAGNOSIS — C7951 Secondary malignant neoplasm of bone: Secondary | ICD-10-CM | POA: Diagnosis present

## 2023-10-08 DIAGNOSIS — R911 Solitary pulmonary nodule: Secondary | ICD-10-CM | POA: Insufficient documentation

## 2023-10-08 NOTE — Progress Notes (Signed)
Nursing interview for New onset of RT hip pain w/ a HX of Malignant neoplasm of lower third of esophagus (HCC).  Patient identity verified x2.  Patient reports RT hip pain average 6/10, that worsens throughout the day and upon walking. Patient denies any other related issues in this area.   Meaningful use complete.  Vitals- BP 115/76 (BP Location: Left Arm, Patient Position: Sitting, Cuff Size: Normal)   Pulse 74   Temp 97.9 F (36.6 C) (Temporal)   Resp 19   Ht 5' 5.5" (1.664 m)   Wt 133 lb (60.3 kg)   SpO2 99%   BMI 21.80 kg/m   This concludes the interaction.  Ruel Favors, LPN

## 2023-10-08 NOTE — Patient Instructions (Signed)
VISIT SUMMARY:  During today's visit, we discussed the ongoing issues related to your esophageal cancer, including difficulties with swallowing and feeding tube complications. We also addressed your pelvic pain and the impact it has on your daily activities. A plan for potential stent placement and palliative radiation therapy was outlined to help manage these symptoms and improve your quality of life.  YOUR PLAN:   -PELVIC PAIN FROM ESOPHAGEAL CANCER METASTASES: Pelvic pain can be caused by various conditions, including issues with bones, muscles, or organs in the pelvic area. Your pain is related to changes in the right pelvic bones. Dr. Mitzi Hansen recommends palliative radiation therapy starting next week. This will involve 10 treatments over two weeks, and you should start to feel improvement after the second week of treatment. Continue to use pain medication as needed during this time. We will also message Dr. Meridee Score about the recommendations from [facility] to see if he can discuss his thoughts on esophageal stenting.  -GENERAL HEALTH MAINTENANCE: Maintaining your overall health is important. You have an annual physical scheduled for November 6th.

## 2023-10-08 NOTE — Progress Notes (Signed)
Radiation Oncology         [phone] ________________________________  Name: Daniel [name]        MRN: [id number]  Date of Service: 10/08/2023 DOB: [birth year]  OZ:DGUY, Vernona Rieger, NP  Malachy Mood, MD     REFERRING PHYSICIAN: Malachy Mood, MD   DIAGNOSIS: The primary encounter diagnosis was Malignant neoplasm of lower third of esophagus (HCC). A diagnosis of Right hip pain was also pertinent to this visit.   HISTORY OF PRESENT ILLNESS: Daniel [name] is a 89 y.o. male seen at the request of Dr. 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 The patient has a history of Multiple myeloma that was diagnosed in 2019.  He  presented with worsening dysphagia and weight loss and endoscopy on 01/05/2022, noted food bolus  in the entire esophagus as well as a medium sized fungating ulcerative mass with stigmata of recent bleeding 38 cm from the incisors completely obstructing and circumferential. Biopsies were obtained and were consistent with adenocarcinoma. PET imaging on 01/14/23 showed hypermetabolic changes in the distal esophagus, mediastinal and bilateral hilar nodes, and the right pubic bone and left 6th rib. He received radiation and did undergo g tube placement for nutritional purposes. He completed radiation on 02/25/23, and has been followed closely since he is not a candidate for systemic therapy. Repeat EGD in May 2024 showed persistent inflammatory changes and biopsies showed residual adenocarcinoma in the esophagus. He had his G tube taken out in July 2024, but ultimately had to have this replaced in September 2024. He was referred for cryoablation of the esophagus primary to [facility], but the team felt this was higher in risk given his age, and prior radiation treatment, and rather recommended an esophageal stent. An MRI on 09/30/23 confirmed right pubic bone lesions without fracture as well as a small right femoral head lesion, age related degenerative changes  of both hips and mild bilateral peritrochanteric tendinopathy left greater than right was also noted.     PREVIOUS RADIATION THERAPY:   01/19/2023 through 02/25/2023 Site Technique Total Dose (Gy) Dose per Fx (Gy) Completed Fx Beam Energies  Esophagus: Esoph IMRT 45/45 1.8 25/25 6X  Esophagus: Esoph_Bst IMRT 5.4/5.4 1.8 3/3 6X    PAST MEDICAL HISTORY:  Past Medical History:  Diagnosis Date   Blood transfusion    CKD (chronic kidney disease) stage 4, GFR 15-29 ml/min (HCC)    DVT (deep venous thrombosis) (HCC)    from surgery, no longer on Eliquis   Gastroesophageal cancer (HCC)    Gout    Hearing loss    Hypertension    Hypertriglyceridemia    Hypothyroid    Prediabetes    Prostate cancer (HCC)    Pulmonary nodule    Tinnitus        PAST SURGICAL HISTORY: Past Surgical History:  Procedure Laterality Date   BIOPSY  01/05/2023   Procedure: BIOPSY;  Surgeon: Lemar Lofty., MD;  Location: [facility] ENDOSCOPY;  Service: Gastroenterology;;   BIOPSY  05/13/2023   Procedure: BIOPSY;  Surgeon: Lemar Lofty., MD;  Location: [facility] ENDOSCOPY;  Service: Gastroenterology;;   CATARACT EXTRACTION Bilateral 2020   ESOPHAGOGASTRODUODENOSCOPY (EGD) WITH PROPOFOL N/A 01/05/2023   Procedure: ESOPHAGOGASTRODUODENOSCOPY (EGD) WITH PROPOFOL;  Surgeon: Lemar Lofty., MD;  Location: [facility] ENDOSCOPY;  Service: Gastroenterology;  Laterality: N/A;   ESOPHAGOGASTRODUODENOSCOPY (EGD) WITH PROPOFOL N/A 05/13/2023   Procedure: ESOPHAGOGASTRODUODENOSCOPY (EGD) WITH PROPOFOL;  Surgeon: Meridee Score Netty Starring., MD;  Location: [facility] ENDOSCOPY;  Service: Gastroenterology;  Laterality: N/A;   FOREIGN BODY REMOVAL  01/05/2023   Procedure: FOREIGN BODY REMOVAL;  Surgeon: Meridee Score Netty Starring., MD;  Location: [facility] ENDOSCOPY;  Service: Gastroenterology;;   HERNIA REPAIR Right 2010   HOT HEMOSTASIS N/A 05/13/2023   Procedure: HOT HEMOSTASIS (ARGON PLASMA COAGULATION/BICAP);  Surgeon: Lemar Lofty., MD;  Location: [facility] ENDOSCOPY;  Service: Gastroenterology;  Laterality: N/A;   IR GASTROSTOMY TUBE MOD SED  01/20/2023   IR GASTROSTOMY TUBE MOD SED  08/26/2023   IR GASTROSTOMY TUBE REMOVAL  07/08/2023   IR NASO G TUBE PLC W/FL W/RAD  01/20/2023   IR NASO G TUBE PLC W/FL W/RAD  08/26/2023   left inguinal hernia repair  06/29/2008   with mesh sail   PROSTATECTOMY  1998   prostate cancer   right ankle  1980   fracture   right knee  1980   torn meniscus   TOTAL KNEE ARTHROPLASTY Right 01/20/2022   Procedure: TOTAL KNEE ARTHROPLASTY;  Surgeon: Dannielle Huh, MD;  Location: [facility] ORS;  Service: Orthopedics;  Laterality: Right;     FAMILY HISTORY:  Family History  Problem Relation Age of Onset   Heart disease Mother    Heart disease Father    Cancer Sister        breast cancer   Heart disease Brother    Prostate cancer Brother    Colon cancer Neg Hx    Esophageal cancer Neg Hx    Inflammatory bowel disease Neg Hx    Liver disease Neg Hx    Pancreatic cancer Neg Hx    Rectal cancer Neg Hx    Stomach cancer Neg Hx      SOCIAL HISTORY:  reports that he has quit smoking. His smoking use included cigarettes. He has a 20 pack-year smoking history. He does not have any smokeless tobacco history on file. He reports that he does not currently use alcohol. He reports that he does not use drugs.  The patient is widowed and lives in [place].  He is accompanied by his daughters.  ALLERGIES: Patient has no known allergies.   MEDICATIONS:  Current Outpatient Medications  Medication Sig Dispense Refill   allopurinol (ZYLOPRIM) 100 MG tablet Take 100 mg by mouth every Monday, Wednesday, and Friday.     ALPRAZolam (XANAX) 0.5 MG tablet Take 0.5 mg by mouth 3 (three) times daily as needed for sleep (tremors).     amLODipine (NORVASC) 5 MG tablet Take 5 mg by mouth daily.     apixaban (ELIQUIS) 2.5 MG TABS tablet Take 1 tablet (2.5 mg total) by mouth 2 (two) times daily. 180 tablet 3    cholecalciferol (VITAMIN D) 1000 units tablet Take 1,000 Units by mouth daily.     levothyroxine (SYNTHROID) 88 MCG tablet Take 88 mcg by mouth daily before breakfast.     metoprolol succinate (TOPROL XL) 25 MG 24 hr tablet Take 0.5 tablets (12.5 mg total) by mouth daily. 45 tablet 3   Nutritional Supplements (KATE FARMS RENAL SUPPORT 1.8) LIQD 250 mLs by Gastrostomy Tube route 4 (four) times daily. Give 120 mL free water flushes before and after each bolus feeding.  Give an additional 180 mL water flush once daily.  Provides 1800 cal, 80 g protein, 1800 mL free water/100% estimated needs.     omeprazole (PRILOSEC) 40 MG capsule OPEN 1 CAPSULE AND SPRINKLE IN APPLESAUCE AND EAT TWICE DAILY 60 capsule 0   oxyCODONE (ROXICODONE) 5 MG/5ML solution Place 5 mLs (5  mg total) into feeding tube every 8 (eight) hours. 100 mL 0   sucralfate (CARAFATE) 1 GM/10ML suspension Take 10 mLs (1 g total) by mouth 4 (four) times daily. 420 mL 12   No current facility-administered medications for this encounter.     REVIEW OF SYSTEMS: On review of systems, the patient reports that he is doing okay. He is still struggling with swallowing and using tube feeds for nutrition. He reports in the past months his pain in his right groin and upper right hip region has been more painful with activities. He is focused on maintaining his level of alertness and does not like to take pain medication also out of fear of falling. He describes the pain as throbbing and aching and that this worsens throughout the day if he is up moving and walking. No other complaints are verbalized.      PHYSICAL EXAM:  Wt Readings from Last 3 Encounters:  10/08/23 133 lb (60.3 kg)  09/28/23 134 lb 11.2 oz (61.1 kg)  09/10/23 128 lb 12.8 oz (58.4 kg)   Temp Readings from Last 3 Encounters:  10/08/23 97.9 F (36.6 C) (Temporal)  09/28/23 98.5 F (36.9 C) (Oral)  09/01/23 98.6 F (37 C) (Oral)   BP Readings from Last 3 Encounters:  10/08/23  115/76  09/28/23 115/67  09/01/23 126/66   Pulse Readings from Last 3 Encounters:  10/08/23 74  09/28/23 86  09/01/23 80   Pain Assessment Pain Score: 6  Pain Loc: Hip (RT)/10  In general this is a thin appearing Caucasian male in no acute distress.  He's alert and oriented x4 and appropriate throughout the examination. Cardiopulmonary assessment is negative for acute distress and he exhibits normal effort.     ECOG = 2  0 - Asymptomatic (Fully active, able to carry on all predisease activities without restriction)  1 - Symptomatic but completely ambulatory (Restricted in physically strenuous activity but ambulatory and able to carry out work of a light or sedentary nature. For example, light housework, office work)  2 - Symptomatic, <50% in bed during the day (Ambulatory and capable of all self care but unable to carry out any work activities. Up and about more than 50% of waking hours)  3 - Symptomatic, >50% in bed, but not bedbound (Capable of only limited self-care, confined to bed or chair 50% or more of waking hours)  4 - Bedbound (Completely disabled. Cannot carry on any self-care. Totally confined to bed or chair)  5 - Death   Santiago Glad MM, Creech RH, Tormey DC, et al. [phone]). "Toxicity and response criteria of the [facility]". Am. Evlyn Clines. Oncol. 5 (6): 649-55    LABORATORY DATA:  Lab Results  Component Value Date   WBC 7.3 09/28/2023   HGB 12.2 (L) 09/28/2023   HCT 36.4 (L) 09/28/2023   MCV 107.4 (H) 09/28/2023   PLT 225 09/28/2023   Lab Results  Component Value Date   NA 137 09/28/2023   K 5.6 (H) 09/28/2023   CL 103 09/28/2023   CO2 27 09/28/2023   Lab Results  Component Value Date   ALT 11 09/28/2023   AST 24 09/28/2023   ALKPHOS 70 09/28/2023   BILITOT 0.3 09/28/2023      RADIOGRAPHY: MR Pelvis W Wo Contrast  Result Date: 09/30/2023 CLINICAL DATA:  Unexplained right hip pain. History of esophageal cancer. EXAM: MRI PELVIS  WITHOUT AND WITH CONTRAST TECHNIQUE: Multiplanar multisequence MR imaging of the pelvis was performed  both before and after administration of intravenous contrast. CONTRAST:  6mL GADAVIST GADOBUTROL 1 MMOL/ML IV SOLN COMPARISON:  CT scan 07/06/2023 FINDINGS: Both hips are normally located. No hip fracture or AVN. Abnormal T1 and T2 signal intensity in the right pubic bone with subsequent contrast enhancement consistent with metastatic disease. This was hypermetabolic the prior PET-CT from January 2024. Small lesion in the right femoral head demonstrates contrast enhancement and could be an erosion or small metastatic focus. No hip joint effusion. Moderate age related degenerative changes involving both hips. Mild bilateral peritrochanteric tendinopathy, left greater than right but no bursitis. Mild edema like signal changes in the adductor muscles bilaterally but no acute muscle tear or intramuscular hematoma. The pubic symphysis and SI joints are intact. No significant intrapelvic abnormalities are identified. IMPRESSION: 1. Right pubic bone lesions. No pathologic fracture. This was hypermetabolic on the prior PET-CT from January 2024. 2. Small lesion in the right femoral head demonstrates contrast enhancement and could be an erosion or small metastatic focus. 3. Moderate age related degenerative changes involving both hips but no hip fracture or AVN. 4. Mild bilateral peritrochanteric tendinopathy, left greater than right but no bursitis. 5. Mild edema like signal changes in the adductor muscles bilaterally but no acute muscle tear or intramuscular hematoma. Electronically Signed   By: Rudie Meyer M.D.   On: 09/30/2023 22:56       IMPRESSION/PLAN: 1. Metastatic Esophageal Adenocarcinoma. Dr. Mitzi Hansen discusses the patient's course to date including his most recent imaging, and evaluation as well through [facility]. We discussed the risks, benefits, short, and long term effects of radiotherapy, as well as the  palliative intent, and the patient is interested in proceeding. Dr. Mitzi Hansen discusses the delivery and logistics of radiotherapy and anticipates a course of 2 weeks of radiotherapy to the pelvis. Written consent is obtained and placed in the chart, a copy was provided to the patient. He will simulate today. He will also follow up with Dr. Meridee Score to discuss esophageal stenting. 2. Painful bone metastases. We will be happy to assist in refills if he decides he needs to start taking his pain medication, but hopefully he will notice improvement in the weeks following his radiation as well.     In a visit lasting 45 minutes, greater than 50% of the time was spent face to face discussing the patient's condition, in preparation for the discussion, and coordinating the patient's care.   The above documentation reflects my direct findings during this shared patient visit. Please see the separate note by Dr. Mitzi Hansen on this date for the remainder of the patient's plan of care.    Osker Mason, [facility]   **Disclaimer: This note was dictated with voice recognition software. Similar sounding words can inadvertently be transcribed and this note may contain transcription errors which may not have been corrected upon publication of note.**

## 2023-10-09 ENCOUNTER — Telehealth: Payer: Self-pay

## 2023-10-09 NOTE — Telephone Encounter (Signed)
-----   Message from [facility] sent at 10/09/2023  4:41 PM EDT ----- Sharene Skeans and Alexia Freestone, If noone else is going to be added on and it works, have them come on 10/30 for EGD stent attempt. Otherwise set up in November. GM ----- Message ----- From: Ronny Bacon, PA-C Sent: 10/08/2023  11:05 AM EDT To: Malachy Mood, MD; Lemar Lofty., MD  Dr. Mosetta Putt, we're going to give 2 weeks to the right pelvis.   Dr. Meridee Score, the family saw [facility] and their recommendations were esophageal stenting, not sure when he's due back to see you and or to discuss your thoughts on this.  Thanks, Jill Side

## 2023-10-12 ENCOUNTER — Other Ambulatory Visit: Payer: Self-pay

## 2023-10-12 DIAGNOSIS — R1319 Other dysphagia: Secondary | ICD-10-CM

## 2023-10-12 DIAGNOSIS — C155 Malignant neoplasm of lower third of esophagus: Secondary | ICD-10-CM

## 2023-10-12 MED ORDER — OMEPRAZOLE 40 MG PO CPDR: DELAYED_RELEASE_CAPSULE | ORAL | 4 refills | Status: DC

## 2023-10-12 NOTE — Telephone Encounter (Signed)
Refill sent to [facility].

## 2023-10-12 NOTE — Telephone Encounter (Signed)
EGD has been set up for 10/30 at 1115 am at [facility] with GM.   Eliquis hold for 2 days prior to the procedure   Left message on machine to call back

## 2023-10-12 NOTE — Telephone Encounter (Signed)
The pt and family would like to speak with GM prior to setting up EGD.  Pt appt cancelled and video visit scheduled for 11/6. The family have been notified.

## 2023-10-12 NOTE — Telephone Encounter (Signed)
That is fine. Clinic visit if it helps, and I can do as a telehealth too. GM

## 2023-10-12 NOTE — Telephone Encounter (Signed)
I spoke to the pt caregiver and she states the pt may not wish to proceed with EGD she is going to speak with him and the family and let us know if he wishes to proceed.

## 2023-10-13 DIAGNOSIS — Z510 Encounter for antineoplastic radiation therapy: Secondary | ICD-10-CM | POA: Diagnosis not present

## 2023-10-14 ENCOUNTER — Ambulatory Visit (HOSPITAL_COMMUNITY): Admit: 2023-10-14 | Payer: Medicare Other | Admitting: Gastroenterology

## 2023-10-14 ENCOUNTER — Encounter (HOSPITAL_COMMUNITY): Payer: Self-pay

## 2023-10-14 SURGERY — ESOPHAGOGASTRODUODENOSCOPY (EGD)
Anesthesia: Monitor Anesthesia Care

## 2023-10-15 ENCOUNTER — Other Ambulatory Visit: Payer: Self-pay

## 2023-10-15 ENCOUNTER — Ambulatory Visit
Admission: RE | Admit: 2023-10-15 | Discharge: 2023-10-15 | Disposition: A | Discharge status: Home / Self Care | Payer: Medicare Other | Source: Ambulatory Visit | Attending: Radiation Oncology | Admitting: Radiation Oncology

## 2023-10-15 DIAGNOSIS — Z510 Encounter for antineoplastic radiation therapy: Secondary | ICD-10-CM | POA: Diagnosis not present

## 2023-10-15 LAB — RAD ONC ARIA SESSION SUMMARY
Course Elapsed Days: 0
Plan Fractions Treated to Date: 1
Plan Prescribed Dose Per Fraction: 3 Gy
Plan Total Fractions Prescribed: 10
Plan Total Prescribed Dose: 30 Gy
Reference Point Dosage Given to Date: 3 Gy
Reference Point Session Dosage Given: 3 Gy
Session Number: 1

## 2023-10-16 ENCOUNTER — Ambulatory Visit
Admission: RE | Admit: 2023-10-16 | Discharge: 2023-10-16 | Disposition: A | Discharge status: Home / Self Care | Payer: Medicare Other | Source: Ambulatory Visit | Attending: Radiation Oncology | Admitting: Radiation Oncology

## 2023-10-16 ENCOUNTER — Other Ambulatory Visit: Payer: Self-pay

## 2023-10-16 DIAGNOSIS — Z510 Encounter for antineoplastic radiation therapy: Secondary | ICD-10-CM | POA: Insufficient documentation

## 2023-10-16 DIAGNOSIS — C7951 Secondary malignant neoplasm of bone: Secondary | ICD-10-CM | POA: Insufficient documentation

## 2023-10-16 DIAGNOSIS — C155 Malignant neoplasm of lower third of esophagus: Secondary | ICD-10-CM | POA: Diagnosis not present

## 2023-10-16 LAB — RAD ONC ARIA SESSION SUMMARY
Course Elapsed Days: 1
Plan Fractions Treated to Date: 2
Plan Prescribed Dose Per Fraction: 3 Gy
Plan Total Fractions Prescribed: 10
Plan Total Prescribed Dose: 30 Gy
Reference Point Dosage Given to Date: 6 Gy
Reference Point Session Dosage Given: 3 Gy
Session Number: 2

## 2023-10-19 ENCOUNTER — Other Ambulatory Visit: Payer: Self-pay

## 2023-10-19 ENCOUNTER — Ambulatory Visit
Admission: RE | Admit: 2023-10-19 | Discharge: 2023-10-19 | Disposition: A | Discharge status: Home / Self Care | Payer: Medicare Other | Source: Ambulatory Visit | Attending: Radiation Oncology | Admitting: Radiation Oncology

## 2023-10-19 DIAGNOSIS — Z510 Encounter for antineoplastic radiation therapy: Secondary | ICD-10-CM | POA: Diagnosis not present

## 2023-10-19 LAB — RAD ONC ARIA SESSION SUMMARY
Course Elapsed Days: 4
Plan Fractions Treated to Date: 3
Plan Prescribed Dose Per Fraction: 3 Gy
Plan Total Fractions Prescribed: 10
Plan Total Prescribed Dose: 30 Gy
Reference Point Dosage Given to Date: 9 Gy
Reference Point Session Dosage Given: 3 Gy
Session Number: 3

## 2023-10-20 ENCOUNTER — Other Ambulatory Visit: Payer: Self-pay

## 2023-10-20 ENCOUNTER — Ambulatory Visit
Admission: RE | Admit: 2023-10-20 | Discharge: 2023-10-20 | Disposition: A | Discharge status: Home / Self Care | Payer: Medicare Other | Source: Ambulatory Visit | Attending: Radiation Oncology | Admitting: Radiation Oncology

## 2023-10-20 DIAGNOSIS — Z510 Encounter for antineoplastic radiation therapy: Secondary | ICD-10-CM | POA: Diagnosis not present

## 2023-10-20 LAB — RAD ONC ARIA SESSION SUMMARY
Course Elapsed Days: 5
Plan Fractions Treated to Date: 4
Plan Prescribed Dose Per Fraction: 3 Gy
Plan Total Fractions Prescribed: 10
Plan Total Prescribed Dose: 30 Gy
Reference Point Dosage Given to Date: 12 Gy
Reference Point Session Dosage Given: 3 Gy
Session Number: 4

## 2023-10-20 NOTE — Telephone Encounter (Signed)
Dr Cathie Beams the pt is not interested in any procedure at this time or virtual visit for tomorrow.  He says if he changes his mind he will call back to make an appt to discuss further.

## 2023-10-20 NOTE — Telephone Encounter (Signed)
Patty, Thank you for update. I'm happy to be available in the future if needed. GM

## 2023-10-20 NOTE — Telephone Encounter (Addendum)
Inbound call from patient's daughter stating patient does not wish to continue with having stent placed and wishing to cancel virtual visit for tomorrow. Daughter requesting a call to discuss further. Please advise, thank you.

## 2023-10-21 ENCOUNTER — Telehealth: Payer: Medicare Other | Admitting: Gastroenterology

## 2023-10-21 ENCOUNTER — Other Ambulatory Visit: Payer: Self-pay

## 2023-10-21 ENCOUNTER — Ambulatory Visit
Admission: RE | Admit: 2023-10-21 | Discharge: 2023-10-21 | Disposition: A | Discharge status: Home / Self Care | Payer: Medicare Other | Source: Ambulatory Visit | Attending: Radiation Oncology

## 2023-10-21 DIAGNOSIS — Z510 Encounter for antineoplastic radiation therapy: Secondary | ICD-10-CM | POA: Diagnosis not present

## 2023-10-21 LAB — RAD ONC ARIA SESSION SUMMARY
Course Elapsed Days: 6
Plan Fractions Treated to Date: 5
Plan Prescribed Dose Per Fraction: 3 Gy
Plan Total Fractions Prescribed: 10
Plan Total Prescribed Dose: 30 Gy
Reference Point Dosage Given to Date: 15 Gy
Reference Point Session Dosage Given: 3 Gy
Session Number: 5

## 2023-10-22 ENCOUNTER — Ambulatory Visit
Admission: RE | Admit: 2023-10-22 | Discharge: 2023-10-22 | Disposition: A | Discharge status: Home / Self Care | Payer: Medicare Other | Source: Ambulatory Visit | Attending: Radiation Oncology | Admitting: Radiation Oncology

## 2023-10-22 ENCOUNTER — Other Ambulatory Visit: Payer: Self-pay

## 2023-10-22 DIAGNOSIS — Z510 Encounter for antineoplastic radiation therapy: Secondary | ICD-10-CM | POA: Diagnosis not present

## 2023-10-22 LAB — RAD ONC ARIA SESSION SUMMARY
Course Elapsed Days: 7
Plan Fractions Treated to Date: 6
Plan Prescribed Dose Per Fraction: 3 Gy
Plan Total Fractions Prescribed: 10
Plan Total Prescribed Dose: 30 Gy
Reference Point Dosage Given to Date: 18 Gy
Reference Point Session Dosage Given: 3 Gy
Session Number: 6

## 2023-10-23 ENCOUNTER — Ambulatory Visit
Admission: RE | Admit: 2023-10-23 | Discharge: 2023-10-23 | Disposition: A | Discharge status: Home / Self Care | Payer: Medicare Other | Source: Ambulatory Visit | Attending: Radiation Oncology | Admitting: Radiation Oncology

## 2023-10-23 ENCOUNTER — Other Ambulatory Visit: Payer: Self-pay

## 2023-10-23 DIAGNOSIS — Z510 Encounter for antineoplastic radiation therapy: Secondary | ICD-10-CM | POA: Diagnosis not present

## 2023-10-23 LAB — RAD ONC ARIA SESSION SUMMARY
Course Elapsed Days: 8
Plan Fractions Treated to Date: 7
Plan Prescribed Dose Per Fraction: 3 Gy
Plan Total Fractions Prescribed: 10
Plan Total Prescribed Dose: 30 Gy
Reference Point Dosage Given to Date: 21 Gy
Reference Point Session Dosage Given: 3 Gy
Session Number: 7

## 2023-10-26 ENCOUNTER — Other Ambulatory Visit: Payer: Self-pay

## 2023-10-26 ENCOUNTER — Ambulatory Visit
Admission: RE | Admit: 2023-10-26 | Discharge: 2023-10-26 | Disposition: A | Discharge status: Home / Self Care | Payer: Medicare Other | Source: Ambulatory Visit | Attending: Radiation Oncology

## 2023-10-26 DIAGNOSIS — Z510 Encounter for antineoplastic radiation therapy: Secondary | ICD-10-CM | POA: Diagnosis not present

## 2023-10-26 LAB — RAD ONC ARIA SESSION SUMMARY
Course Elapsed Days: 11
Plan Fractions Treated to Date: 8
Plan Prescribed Dose Per Fraction: 3 Gy
Plan Total Fractions Prescribed: 10
Plan Total Prescribed Dose: 30 Gy
Reference Point Dosage Given to Date: 24 Gy
Reference Point Session Dosage Given: 3 Gy
Session Number: 8

## 2023-10-27 ENCOUNTER — Other Ambulatory Visit: Payer: Self-pay

## 2023-10-27 ENCOUNTER — Ambulatory Visit
Admission: RE | Admit: 2023-10-27 | Discharge: 2023-10-27 | Disposition: A | Discharge status: Home / Self Care | Payer: Medicare Other | Source: Ambulatory Visit | Attending: Radiation Oncology

## 2023-10-27 DIAGNOSIS — Z510 Encounter for antineoplastic radiation therapy: Secondary | ICD-10-CM | POA: Diagnosis not present

## 2023-10-27 LAB — RAD ONC ARIA SESSION SUMMARY
Course Elapsed Days: 12
Plan Fractions Treated to Date: 9
Plan Prescribed Dose Per Fraction: 3 Gy
Plan Total Fractions Prescribed: 10
Plan Total Prescribed Dose: 30 Gy
Reference Point Dosage Given to Date: 27 Gy
Reference Point Session Dosage Given: 3 Gy
Session Number: 9

## 2023-10-28 ENCOUNTER — Other Ambulatory Visit: Payer: Self-pay

## 2023-10-28 ENCOUNTER — Ambulatory Visit
Admission: RE | Admit: 2023-10-28 | Discharge: 2023-10-28 | Disposition: A | Discharge status: Home / Self Care | Payer: Medicare Other | Source: Ambulatory Visit | Attending: Radiation Oncology | Admitting: Radiation Oncology

## 2023-10-28 DIAGNOSIS — Z510 Encounter for antineoplastic radiation therapy: Secondary | ICD-10-CM | POA: Diagnosis not present

## 2023-10-28 LAB — RAD ONC ARIA SESSION SUMMARY
Course Elapsed Days: 13
Plan Fractions Treated to Date: 10
Plan Prescribed Dose Per Fraction: 3 Gy
Plan Total Fractions Prescribed: 10
Plan Total Prescribed Dose: 30 Gy
Reference Point Dosage Given to Date: 30 Gy
Reference Point Session Dosage Given: 3 Gy
Session Number: 10

## 2023-10-29 NOTE — Radiation Completion Notes (Addendum)
  Radiation Oncology         [phone] ________________________________  Name: Daniel [name] MRN: [id number]  Date of Service: 10/28/2023  DOB: June 19, 1927  End of Treatment Note     Diagnosis:  Metastatic Esophageal Adenocarcinoma   Intent: Palliative     ==========DELIVERED PLANS==========  First Treatment Date: 2023-10-15 - Last Treatment Date: 2023-10-28   Plan Name: Pelvis_R Site: Pubis, Right Technique: 3D Mode: Photon Dose Per Fraction: 3 Gy Prescribed Dose (Delivered / Prescribed): 30 Gy / 30 Gy Prescribed Fxs (Delivered / Prescribed): 10 / 10     ==========ON TREATMENT VISIT DATES========== 2023-10-16, 2023-10-23   See weekly On Treatment Notes in Epic for details. The patient tolerated radiation.   The patient will receive a call in about one month from the radiation oncology department. He will continue follow up with Dr. Mosetta Putt as well.      Osker Mason, PAC

## 2023-11-01 NOTE — Assessment & Plan Note (Signed)
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  -Received palliative radiation from 01/18/23-02/25/2023 -He responded well to RT, and was able to eat most food after therapy, and the feeding tube was removed in July 2024 -We again reviewed the option of chemotherapy, HER2 antibody and immunotherapy.  I do not think immunotherapy alone without chemotherapy will be much benefit.  Due to his advanced age, CKD, the goal of quality of her life, I will hold on systemic treatment for now. -his tumor showed PD-L1 CPS 25%, I do not think immunotherapy alone is sufficient to control his disease and do not recommend.  -He had EGD on 05/13/2023 which showed extensive inflammation at the previous cancer site, biopsy showed residual invasive adenocarcinoma. -Due to worsening dysphagia from cancer, he had feeding tube replacement again in early September 2024. -He has been referred to [facility] for cryoablation of his esophageal cancer, but it was felt too risk for him  -He received palliative RT to right pubic bone met 10/31-11/10/2023 -We briefly discussed the option of Her2 antibody conjugate, such as Enhertu or Kadcyla if he has cancer progression and no more local therapy available.

## 2023-11-02 ENCOUNTER — Inpatient Hospital Stay: Payer: Medicare Other | Attending: Physician Assistant | Admitting: Hematology

## 2023-11-02 ENCOUNTER — Encounter: Payer: Self-pay | Admitting: Hematology

## 2023-11-02 DIAGNOSIS — C155 Malignant neoplasm of lower third of esophagus: Secondary | ICD-10-CM

## 2023-11-02 NOTE — Progress Notes (Signed)
[facility]   Telephone:([phone] Fax:([phone]) [phone]   Clinic Follow up Note   Patient Care Team: Loyola Mast, NP as PCP - General (Family Medicine) Thomasene Ripple, DO as PCP - Cardiology (Cardiology) Malachy Mood, MD as Attending Physician (Hematology and Oncology) 11/02/2023  I connected with Daniel [name] on Daniel [name] at  2:00 PM EST by telephone and verified that I am speaking with the correct person using two identifiers.   I discussed the limitations, risks, security and privacy concerns of performing an evaluation and management service by telephone and the availability of in person appointments. I also discussed with the patient that there may be a patient responsible charge related to this service. The patient expressed understanding and agreed to proceed.   Patient's location:  Home  Provider's location:  Office    CHIEF COMPLAINT: Follow-up of metastatic esophageal cancer show improved (   CURRENT THERAPY: Supportive care  Oncology history Esophageal cancer (HCC) stage IV, with nodal and bone metastasis; MMR normal, PD-L1 + (CPS 25%), HER 2+ by IHC, positive by FISH -diagnosed in 12/2022 -PET 01/04/23 showed hypermetabolic distal esophageal uptake, and hypermetabolic mediastinal and bilateral hilar adenopathy, right pubic bone and anterior left sixth ribs, concerning for metastatic disease.  -Received palliative radiation from 01/18/23-02/25/2023 -He responded well to RT, and was able to eat most food after therapy, and the feeding tube was removed in July 2024 -We again reviewed the option of chemotherapy, HER2 antibody and immunotherapy.  I do not think immunotherapy alone without chemotherapy will be much benefit.  Due to his advanced age, CKD, the goal of quality of her life, I will hold on systemic treatment for now. -his tumor showed PD-L1 CPS 25%, I do not think immunotherapy alone is sufficient to control his disease and do not recommend.  -He had EGD on  05/13/2023 which showed extensive inflammation at the previous cancer site, biopsy showed residual invasive adenocarcinoma. -Due to worsening dysphagia from cancer, he had feeding tube replacement again in early September 2024. -He has been referred to [facility] for cryoablation of his esophageal cancer, but it was felt too risk for him  -He received palliative RT to right pubic bone met 10/31-11/10/2023 -We briefly discussed the option of Her2 antibody conjugate, such as Enhertu or Kadcyla if he has cancer progression and no more local therapy available.  Assessment and Plan    Esophageal Cancer 89-year-old with esophageal cancer, status post-radiation therapy on October 28, 2023. Reports improvement in pain and strength, no longer requiring pain medication. Decision made to not proceed with esophageal stent at this time due to concerns about lack of flexibility, potential for food impaction, and acid reflux. Able to drink liquids and take medications without difficulty. Discussed potential future need for stent if symptoms worsen and preference for local procedure by Dr. Berna Spare. - Consider esophageal stent in the future if symptoms worsen - Contact Barb regarding unused I-4 nutrition formula  Pain Management Reports significant reduction in pain after RT and is not currently taking any pain medication. Modafinil is available if needed. - Use modafinil as needed for pain management  Nutritional Support Tolerating Jae Dire nutrition well, reporting it is lighter on the stomach compared to I-4. No current issues with nutrition intake. - Continue J[employer] nutrition - Counsellor regarding unused I-4 nutrition formula  General Health Maintenance Receiving adequate care from family members, including a nurse in the family who manages medications. Discussed potential future need for home health services if  condition changes. - Consider palliative home health services in the future if  needed  Follow-up -pt's daughter will call if experiencing any new problems or pain.         SUMMARY OF ONCOLOGIC HISTORY: Oncology History Overview Note   Cancer Staging  Lambda light chain myeloma (HCC) Staging form: Multiple Myeloma, AJCC 6th Edition - Clinical stage from 09/06/2018: Stage IB - Signed by Arthur Holms, MD on 09/20/2018     Lambda light chain myeloma (HCC)  09/02/2018 Initial Diagnosis   Lambda light chain myeloma (HCC)   09/06/2018 Miscellaneous   Baseline labs:  Kappa 56, lambda 4012, ratio 0.01 SPEP: M-spike 0.1 g/dl, monoclonal lambda light chain UPEP: M-spike 178 mg/dl, Bence Jones Protein positive; lambda type. Beta-2 micro: 5.9 Normal immunoglogulin heavy chains Hgb 11.4 Creatinine 2.08 No hypercalcemia   09/09/2018 Imaging   Metastatic skeletal survey negative   09/13/2018 Bone Marrow Biopsy   Morphology (Accession: [id number]): Bone Marrow, Aspirate,Biopsy, and Clot - HYPERCELLULAR BONE MARROW WITH PLASMA CELL NEOPLASM (12% cells, IHC showed lambda light chain restriction) - TRILINEAGE HEMATOPOIESIS. - SEE COMMENT.  Flow cytometry (Accession: [id number]):  - NO MONOCLONAL B-CELL POPULATION IDENTIFIED. - PREDOMINANCE OF T LYMPHOCYTES AND NATURAL KILLER CELLS.   01/05/2023 Pathology Results    FINAL MICROSCOPIC DIAGNOSIS:   A. STOMACH, BIOPSY:  - Gastric mucosa with focally active chronic gastritis, see comment  - Multifocal intestinal metaplasia, negative for dysplasia   B. GE JUNCTION MASS, BIOPSY:  - Invasive moderately differentiated adenocarcinoma    01/07/2023 Imaging    IMPRESSION: 1. Distal esophageal stricture extending over about a 3.9 cm vertical excursion. No findings of metastatic disease to the chest, abdomen, or pelvis. 2. 0.5 cm sub solid nodule in the right middle lobe. Fleischner guidelines do not apply due to history of malignancy. Surveillance of the context of the patient's follow up oncology imaging is suggested. 3.  Nonspecific lobulation of the left mid kidney. This could be incidental or due to CT equivocal end of a dromedary hump but I cannot exclude an underlying left mid kidney mass. This could be surveilled in the context of the patient's follow up oncology imaging, or if clinically warranted, renal MRI with and without contrast could be utilized for further characterization. 4. L1 compression fracture with 90% anterior compression and 6 mm of posterior bony retropulsion, likely late subacute chronicity. 5. Wall thickening in the proximal colon extending to the transverse colon, cannot exclude proximal colitis. 6. Other imaging findings of potential clinical significance: Coronary, aortic arch, and branch vessel atherosclerotic disease. Mitral and aortic valve calcification. Small type 1 hiatal hernia along the lower margin of the mass. Central airway thickening is present, suggesting bronchitis or reactive airways disease. Fatty deposition in the wall of the ascending colon may be incidental but which can have a weak association with inflammatory bowel disease. Lumbar spondylosis and degenerative disc disease contributing to multilevel impingement. 6 mm left kidney lower pole nonobstructive renal calculus.   Esophageal cancer (HCC)  01/05/2023 Pathology Results    FINAL MICROSCOPIC DIAGNOSIS:   A. STOMACH, BIOPSY:  - Gastric mucosa with focally active chronic gastritis, see comment  - Multifocal intestinal metaplasia, negative for dysplasia   B. GE JUNCTION MASS, BIOPSY:  - Invasive moderately differentiated adenocarcinoma    01/07/2023 Imaging    IMPRESSION: 1. Distal esophageal stricture extending over about a 3.9 cm vertical excursion. No findings of metastatic disease to the chest, abdomen, or pelvis. 2. 0.5 cm sub solid nodule in the  right middle lobe. Fleischner guidelines do not apply due to history of malignancy. Surveillance of the context of the patient's follow up  oncology imaging is suggested. 3. Nonspecific lobulation of the left mid kidney. This could be incidental or due to CT equivocal end of a dromedary hump but I cannot exclude an underlying left mid kidney mass. This could be surveilled in the context of the patient's follow up oncology imaging, or if clinically warranted, renal MRI with and without contrast could be utilized for further characterization. 4. L1 compression fracture with 90% anterior compression and 6 mm of posterior bony retropulsion, likely late subacute chronicity. 5. Wall thickening in the proximal colon extending to the transverse colon, cannot exclude proximal colitis. 6. Other imaging findings of potential clinical significance: Coronary, aortic arch, and branch vessel atherosclerotic disease. Mitral and aortic valve calcification. Small type 1 hiatal hernia along the lower margin of the mass. Central airway thickening is present, suggesting bronchitis or reactive airways disease. Fatty deposition in the wall of the ascending colon may be incidental but which can have a weak association with inflammatory bowel disease. Lumbar spondylosis and degenerative disc disease contributing to multilevel impingement. 6 mm left kidney lower pole nonobstructive renal calculus.     01/09/2023 Initial Diagnosis   Esophageal cancer (HCC)     Discussed the use of AI scribe software for clinical note transcription with the patient, who gave verbal consent to proceed.  History of Present Illness   A 89 year old male with a history of esophageal cancer presents for a follow-up after recent radiation therapy. The patient reports an improvement in pain and an increase in strength, particularly noting less discomfort in the right leg. The patient has not required any pain medication recently. The family had considered a stent placement at [facility], but decided against it as the patient is currently able to swallow liquids and take medications  without difficulty. The family expressed concerns about potential complications from the stent, including lack of flexibility, risk of food getting caught, and potential for increased acid reflux. The patient's nutrition has been managed with [employer] nutrition, which has been well-tolerated and preferred over I-4 due to its lighter consistency. The patient has a strong support system at home, with multiple family members providing care and monitoring his condition daily.         MEDICAL HISTORY:  Past Medical History:  Diagnosis Date   Blood transfusion    CKD (chronic kidney disease) stage 4, GFR 15-29 ml/min (HCC)    DVT (deep venous thrombosis) (HCC)    from surgery, no longer on Eliquis   Gastroesophageal cancer (HCC)    Gout    Hearing loss    Hypertension    Hypertriglyceridemia    Hypothyroid    Prediabetes    Prostate cancer (HCC)    Pulmonary nodule    Tinnitus     SURGICAL HISTORY: Past Surgical History:  Procedure Laterality Date   BIOPSY  01/05/2023   Procedure: BIOPSY;  Surgeon: Lemar Lofty., MD;  Location: [facility];  Service: Gastroenterology;;   BIOPSY  05/13/2023   Procedure: BIOPSY;  Surgeon: Lemar Lofty., MD;  Location: [facility] ENDOSCOPY;  Service: Gastroenterology;;   CATARACT EXTRACTION Bilateral 2020   ESOPHAGOGASTRODUODENOSCOPY (EGD) WITH PROPOFOL N/A 01/05/2023   Procedure: ESOPHAGOGASTRODUODENOSCOPY (EGD) WITH PROPOFOL;  Surgeon: Lemar Lofty., MD;  Location: [facility];  Service: Gastroenterology;  Laterality: N/A;   ESOPHAGOGASTRODUODENOSCOPY (EGD) WITH PROPOFOL N/A 05/13/2023   Procedure: ESOPHAGOGASTRODUODENOSCOPY (EGD) WITH PROPOFOL;  Surgeon: Lemar Lofty., MD;  Location: [facility] ENDOSCOPY;  Service: Gastroenterology;  Laterality: N/A;   FOREIGN BODY REMOVAL  01/05/2023   Procedure: FOREIGN BODY REMOVAL;  Surgeon: Meridee Score Netty Starring., MD;  Location: [facility] ENDOSCOPY;  Service: Gastroenterology;;   HERNIA REPAIR Right  2010   HOT HEMOSTASIS N/A 05/13/2023   Procedure: HOT HEMOSTASIS (ARGON PLASMA COAGULATION/BICAP);  Surgeon: Lemar Lofty., MD;  Location: [facility] ENDOSCOPY;  Service: Gastroenterology;  Laterality: N/A;   IR GASTROSTOMY TUBE MOD SED  01/20/2023   IR GASTROSTOMY TUBE MOD SED  08/26/2023   IR GASTROSTOMY TUBE REMOVAL  07/08/2023   IR NASO G TUBE PLC W/FL W/RAD  01/20/2023   IR NASO G TUBE PLC W/FL W/RAD  08/26/2023   left inguinal hernia repair  06/29/2008   with mesh sail   PROSTATECTOMY  1998   prostate cancer   right ankle  1980   fracture   right knee  1980   torn meniscus   TOTAL KNEE ARTHROPLASTY Right 01/20/2022   Procedure: TOTAL KNEE ARTHROPLASTY;  Surgeon: Dannielle Huh, MD;  Location: [facility] ORS;  Service: Orthopedics;  Laterality: Right;    I have reviewed the social history and family history with the patient and they are unchanged from previous note.  ALLERGIES:  has No Known Allergies.  MEDICATIONS:  Current Outpatient Medications  Medication Sig Dispense Refill   allopurinol (ZYLOPRIM) 100 MG tablet Take 100 mg by mouth every Monday, Wednesday, and Friday.     ALPRAZolam (XANAX) 0.5 MG tablet Take 0.5 mg by mouth 3 (three) times daily as needed for sleep (tremors).     amLODipine (NORVASC) 5 MG tablet Take 5 mg by mouth daily.     apixaban (ELIQUIS) 2.5 MG TABS tablet Take 1 tablet (2.5 mg total) by mouth 2 (two) times daily. 180 tablet 3   cholecalciferol (VITAMIN D) 1000 units tablet Take 1,000 Units by mouth daily.     levothyroxine (SYNTHROID) 88 MCG tablet Take 88 mcg by mouth daily before breakfast.     metoprolol succinate (TOPROL XL) 25 MG 24 hr tablet Take 0.5 tablets (12.5 mg total) by mouth daily. 45 tablet 3   Nutritional Supplements ([employer] RENAL SUPPORT 1.8) LIQD 250 mLs by Gastrostomy Tube route 4 (four) times daily. Give 120 mL free water flushes before and after each bolus feeding.  Give an additional 180 mL water flush once daily.  Provides 1800 cal, 80  g protein, 1800 mL free water/100% estimated needs.     omeprazole (PRILOSEC) 40 MG capsule OPEN 1 CAPSULE AND SPRINKLE IN APPLESAUCE AND EAT TWICE DAILY 180 capsule 4   oxyCODONE (ROXICODONE) 5 MG/5ML solution Place 5 mLs (5 mg total) into feeding tube every 8 (eight) hours. 100 mL 0   sucralfate (CARAFATE) 1 GM/10ML suspension Take 10 mLs (1 g total) by mouth 4 (four) times daily. 420 mL 12   No current facility-administered medications for this visit.    PHYSICAL EXAMINATION: Not performed   LABORATORY DATA:  I have reviewed the data as listed    Latest Ref Rng & Units 09/28/2023   12:57 PM 09/07/2023    2:18 PM 08/31/2023    2:09 PM  CBC  WBC 4.0 - 10.5 K/uL 7.3  8.0  6.2   Hemoglobin 13.0 - 17.0 g/dL 95.1  88.4  16.6   Hematocrit 39.0 - 52.0 % 36.4  35.9  35.1   Platelets 150 - 400 K/uL 225  236  204  Latest Ref Rng & Units 09/28/2023   12:57 PM 09/10/2023    2:42 PM 09/07/2023    2:18 PM  CMP  Glucose 70 - 99 mg/dL 295  621  80   BUN 8 - 23 mg/dL 69  59  56   Creatinine 0.61 - 1.24 mg/dL 3.08  6.57  8.46   Sodium 135 - 145 mmol/L 137  137  137   Potassium 3.5 - 5.1 mmol/L 5.6  5.3  5.7   Chloride 98 - 111 mmol/L 103  105  105   CO2 22 - 32 mmol/L 27  25  27    Calcium 8.9 - 10.3 mg/dL 9.0  8.7  8.9   Total Protein 6.5 - 8.1 g/dL 6.1  5.8  5.9   Total Bilirubin 0.3 - 1.2 mg/dL 0.3  0.4  0.4   Alkaline Phos 38 - 126 U/L 70  68  73   AST 15 - 41 U/L 24  19  19    ALT 0 - 44 U/L 11  8  7        RADIOGRAPHIC STUDIES: I have personally reviewed the radiological images as listed and agreed with the findings in the report. No results found.     I discussed the assessment and treatment plan with the patient. The patient was provided an opportunity to ask questions and all were answered. The patient agreed with the plan and demonstrated an understanding of the instructions.   The patient was advised to call back or seek an in-person evaluation if the symptoms  worsen or if the condition fails to improve as anticipated.  I provided 12 minutes of non face-to-face telephone visit time during this encounter, and > 50% was spent counseling as documented under my assessment & plan.     Malachy Mood, MD Daniel [name]

## 2023-11-09 ENCOUNTER — Other Ambulatory Visit: Payer: Self-pay | Admitting: Hematology

## 2023-11-09 ENCOUNTER — Other Ambulatory Visit (HOSPITAL_COMMUNITY): Payer: Self-pay

## 2023-11-09 ENCOUNTER — Telehealth: Payer: Self-pay

## 2023-11-09 ENCOUNTER — Other Ambulatory Visit: Payer: Self-pay

## 2023-11-09 ENCOUNTER — Telehealth: Payer: Self-pay | Admitting: Dietician

## 2023-11-09 NOTE — Telephone Encounter (Signed)
Call placed to patient per in-basket request for reordering tube feeding formula and supplies.   RD spoke with daughter of patient Daniel [name]). Informed her that she would need to contact [employer] to place monthly enteral orders. Contact information provided [phone]). Patient verbalized understanding.

## 2023-11-09 NOTE — Telephone Encounter (Signed)
Wonderful. Thank you.

## 2023-11-09 NOTE — Telephone Encounter (Signed)
Pt's friend and pt called regarding Nutritional Supplements.  Pt and friend stated the pt only has 2 days worth of nutritional supplements left before being completely out.  Pt's friend stated the pt usually gets the nutritional supplements through mail but have not gotten any.  Pt's friend stated she usually speak with [employer] Jarred who initially helped with getting the nutritional supplement ordered.  Stated this nurse will reach out to [place] and the Nutritional Team to see if they could contact the pt and friend regarding the delivery status of the Nutritional Supplement [employer] Renal Support 1.8).  Pt and friend verbalized understanding and had no further questions or concerns.

## 2023-11-23 ENCOUNTER — Other Ambulatory Visit: Payer: Self-pay

## 2023-11-23 ENCOUNTER — Encounter: Payer: Self-pay | Admitting: Hematology

## 2023-11-23 ENCOUNTER — Telehealth: Payer: Self-pay

## 2023-11-23 NOTE — Telephone Encounter (Signed)
Pt's daughter called stating that pt has recently completed radiation and seemed to be doing well several days afterwards but now pt has developed complete confusion.  Pt's daughter stated the pt is drinking 24oz H2O and they are administering of water/fluids daily in his feeding tube.  Pt's daughter denied fever, lower back pain, and difficulty urinating.  Pt's daughter stated the pt is extremely confused talking to objects w/in the house.  Walking around the house w/o clothes.  Pt's daughter wants to know what should they do because the pt's mental changes are so sudden.  Stated this nurse will make Dr. Mosetta Putt and her team aware of the changes in the pt's mentation.

## 2023-11-23 NOTE — Telephone Encounter (Signed)
Spoke with pt's daughter Bonita Quin regarding previous telephone call conversation about pt's mental status.  Daphane Shepherd, PA in [facility] recommended that the pt goes to the ED for further evaluation d/t increase in pt's BP.  Informed Bonita Quin of this recommendation and Bonita Quin agreed.  Bonita Quin stated she and her other sister Jaynie Crumble will take pt to [facility] ED now.  Bonita Quin had no further questions or concerns at this time.

## 2023-11-25 ENCOUNTER — Inpatient Hospital Stay: Payer: Medicare Other | Attending: Physician Assistant | Admitting: Dietician

## 2023-11-25 NOTE — Progress Notes (Signed)
Nutrition Follow Up:  Reached out to daughter Bonita Quin by telephone.  She reports changing formula to [employer] 1.8 has made the world of difference.  So much better than with the [facility] 1.4.  He was having terrible constipation reported felt like "had brick in stomach.  Since switching to KF 1.8 been night and day difference bowels regular.  She reports he's been gaining weight and currently ranges between 146-148#.  She weighs him each day. PO intake is limited to just water and she reports he is drinking additional 24oz PO water.  She said he was trying to mash bananas and tomato juice and eat/drink them but she isn't letting him with the elevation in his K+.  Bonita Quin reports that she also has CKD and is well aware of dangers of hyperkalemia. Flushes with 2 syringes free before and after.  Current enteral feeds provide 1800 calories, 8g protein and 660 ml free water with flushes and with additional PO water meeting 100% EEN. Daughter has plenty of supplies and is aware how to reorder each month. Provided my contact information for questions and concerns.  Gennaro Africa, RDN, LDN Registered Dietitian, [facility] Part Time Remote (Usual office hours: Tuesday-Thursday) Mobile: [phone] Remote Office: [phone]

## 2023-11-30 ENCOUNTER — Ambulatory Visit
Admission: RE | Admit: 2023-11-30 | Discharge: 2023-11-30 | Disposition: A | Discharge status: Home / Self Care | Payer: Medicare Other | Source: Ambulatory Visit | Attending: Hematology | Admitting: Hematology

## 2023-11-30 DIAGNOSIS — C7951 Secondary malignant neoplasm of bone: Secondary | ICD-10-CM | POA: Insufficient documentation

## 2023-11-30 DIAGNOSIS — C155 Malignant neoplasm of lower third of esophagus: Secondary | ICD-10-CM | POA: Insufficient documentation

## 2023-11-30 DIAGNOSIS — Z510 Encounter for antineoplastic radiation therapy: Secondary | ICD-10-CM | POA: Insufficient documentation

## 2023-11-30 NOTE — Progress Notes (Signed)
  Radiation Oncology         [phone]) [phone] ________________________________  Name: Daniel [name] MRN: [id number]  Date of Service: 11/30/2023  DOB: 01-20-1927  Post Treatment Telephone Note  Diagnosis:  Metastatic Esophageal Adenocarcinoma (as documented in provider EOT note)  The patient was available for call today.   Symptoms of fatigue have improved since completing therapy.  Symptoms of skin changes have improved since completing therapy.  Symptoms of difficulty swallowing have improved since completing therapy.   The patient has scheduled follow up with his medical oncologist Dr. Mosetta Putt for ongoing surveillance, and with Dr. Basilio Cairo. The patient was encouraged to call if he  develops concerns or questions regarding radiation.   This concludes the interaction.  Ruel Favors, LPN

## 2024-01-15 ENCOUNTER — Other Ambulatory Visit: Payer: Self-pay | Admitting: Cardiology

## 2024-01-23 ENCOUNTER — Other Ambulatory Visit: Payer: Self-pay | Admitting: Cardiology

## 2024-01-25 NOTE — Telephone Encounter (Signed)
 Prescription refill request for Eliquis  received. Indication:afib Last office visit:needs appt Scr:1.68  10/24 Age: 89 Weight:60.3  kg  Prescription refilled

## 2024-01-27 ENCOUNTER — Inpatient Hospital Stay: Payer: Medicare Other | Attending: Physician Assistant | Admitting: Dietician

## 2024-01-27 NOTE — Progress Notes (Signed)
Nutrition Follow Up:  Reached out to daughter Bonita Quin by telephone.  She reports patient is tolerating  current formula well.  Using 4 cartons of [employer] 1.8 Bowels regula, energy level is up.  Patient currently being supported by his 5 adult children who share responsibility of spend each day with him.   She also reports SIL is a Engineer, civil (consulting) and she helps to manage his meds. She reports weight currently ranges between 145-148#.  She weighs him each day.  PO intake is limited to just water and she reports he is drinking additional 48oz PO water.  Current enteral feeds provide 1800 calories, 80g protein and 660 ml free water with flushes and with additional PO water meeting 100% EEN. Daughter has plenty of supplies and is aware how to reorder each month. Patient currently under palliative care only. Remote follow up 2 months.  Gennaro Africa, RDN, LDN Registered Dietitian, [facility] Part Time Remote (Usual office hours: Tuesday-Thursday) Mobile: [phone] Remote Office: [phone]

## 2024-02-01 ENCOUNTER — Telehealth: Payer: Self-pay | Admitting: Cardiology

## 2024-02-01 NOTE — Telephone Encounter (Signed)
 Disregard

## 2024-02-19 ENCOUNTER — Other Ambulatory Visit: Payer: Self-pay | Admitting: Cardiology

## 2024-02-19 ENCOUNTER — Telehealth: Payer: Self-pay | Admitting: Cardiology

## 2024-02-19 NOTE — Telephone Encounter (Signed)
 Prescription refill request for Eliquis received. Indication:afib Last office visit:needs appt Scr:1.33  3/25 Age: 89 Weight:60.3  kg  Prescription refilled

## 2024-02-19 NOTE — Telephone Encounter (Signed)
 Pt c/o medication issue:  1. Name of Medication:   apixaban (ELIQUIS) 2.5 MG TABS tablet    2. How are you currently taking this medication (dosage and times per day)? As written   3. Are you having a reaction (difficulty breathing--STAT)? no  4. What is your medication issue? Pt daughter called in stating pt is in a nursing home and has cancer, he is not physically able to leave the facility. However, pt needs refill for this med, can this be filled without f/u appt right now. Please advise.

## 2024-02-19 NOTE — Telephone Encounter (Signed)
 Returned call and spoke with pt's daughter. Made her aware pt is overdue to see provider; however, we understand it is difficult for pt to come to the office for his appt. I made her aware our office also offers tele-visit. She was very appreciative of this info and stated she would call to schedule pt a tele-visit.

## 2024-02-29 ENCOUNTER — Ambulatory Visit: Payer: Medicare Other | Admitting: Cardiology

## 2024-03-30 ENCOUNTER — Inpatient Hospital Stay: Payer: Medicare Other | Admitting: Dietician

## 2024-03-30 ENCOUNTER — Telehealth: Payer: Self-pay | Admitting: Dietician

## 2024-03-30 NOTE — Telephone Encounter (Signed)
 Attempted to reach patient's daughter for a scheduled remote nutrition consult. Provided my cell# on voice mail to return call for his follow up consult.  Carleen Chary, RDN, LDN Registered Dietitian, [facility] Part Time Remote (Usual office hours: Tuesday-Thursday) Cell: [phone]

## 2024-03-31 ENCOUNTER — Encounter: Payer: Self-pay | Admitting: Dietician

## 2024-03-31 NOTE — Progress Notes (Signed)
 Received a voice mail message from [place] (patient's daughter). Patient is currently in a nursing home.  Carleen Chary, RDN, LDN Registered Dietitian, [facility] Part Time Remote (Usual office hours: Tuesday-Thursday) Mobile: [phone] Remote Office: [phone]

## 2024-05-10 DIAGNOSIS — I10 Essential (primary) hypertension: Secondary | ICD-10-CM | POA: Diagnosis not present

## 2024-05-10 DIAGNOSIS — I4891 Unspecified atrial fibrillation: Secondary | ICD-10-CM | POA: Diagnosis not present

## 2024-05-10 DIAGNOSIS — J449 Chronic obstructive pulmonary disease, unspecified: Secondary | ICD-10-CM | POA: Diagnosis not present

## 2024-05-10 DIAGNOSIS — E039 Hypothyroidism, unspecified: Secondary | ICD-10-CM | POA: Diagnosis not present

## 2024-05-12 DIAGNOSIS — E46 Unspecified protein-calorie malnutrition: Secondary | ICD-10-CM | POA: Diagnosis not present

## 2024-05-12 DIAGNOSIS — I480 Paroxysmal atrial fibrillation: Secondary | ICD-10-CM | POA: Diagnosis not present

## 2024-05-12 DIAGNOSIS — J449 Chronic obstructive pulmonary disease, unspecified: Secondary | ICD-10-CM | POA: Diagnosis not present

## 2024-05-12 DIAGNOSIS — C9000 Multiple myeloma not having achieved remission: Secondary | ICD-10-CM | POA: Diagnosis not present

## 2024-05-19 ENCOUNTER — Encounter: Payer: Self-pay | Admitting: Hematology

## 2024-06-06 DIAGNOSIS — G4700 Insomnia, unspecified: Secondary | ICD-10-CM | POA: Diagnosis not present

## 2024-06-06 DIAGNOSIS — K5900 Constipation, unspecified: Secondary | ICD-10-CM | POA: Diagnosis not present

## 2024-06-06 DIAGNOSIS — K2100 Gastro-esophageal reflux disease with esophagitis, without bleeding: Secondary | ICD-10-CM

## 2024-06-06 DIAGNOSIS — I5030 Unspecified diastolic (congestive) heart failure: Secondary | ICD-10-CM | POA: Diagnosis not present

## 2024-06-06 DIAGNOSIS — I4891 Unspecified atrial fibrillation: Secondary | ICD-10-CM | POA: Diagnosis not present

## 2024-06-20 DIAGNOSIS — G4700 Insomnia, unspecified: Secondary | ICD-10-CM | POA: Diagnosis not present

## 2024-06-20 DIAGNOSIS — E039 Hypothyroidism, unspecified: Secondary | ICD-10-CM | POA: Diagnosis not present

## 2024-06-20 DIAGNOSIS — J440 Chronic obstructive pulmonary disease with acute lower respiratory infection: Secondary | ICD-10-CM | POA: Diagnosis not present

## 2024-06-20 DIAGNOSIS — I10 Essential (primary) hypertension: Secondary | ICD-10-CM | POA: Diagnosis not present

## 2024-07-15 DEATH — deceased
